# Patient Record
Sex: Male | Born: 1940 | Race: White | Hispanic: No | Smoking: Former smoker
Health system: Southern US, Community
[De-identification: ages and names within clinical notes are randomized; demographics above are authoritative.]

## PROBLEM LIST (undated history)

## (undated) DIAGNOSIS — M1711 Unilateral primary osteoarthritis, right knee: Secondary | ICD-10-CM

## (undated) DIAGNOSIS — K5792 Diverticulitis of intestine, part unspecified, without perforation or abscess without bleeding: Secondary | ICD-10-CM

## (undated) DIAGNOSIS — I2510 Atherosclerotic heart disease of native coronary artery without angina pectoris: Secondary | ICD-10-CM

## (undated) DIAGNOSIS — I10 Essential (primary) hypertension: Secondary | ICD-10-CM

## (undated) DIAGNOSIS — G4730 Sleep apnea, unspecified: Secondary | ICD-10-CM

## (undated) DIAGNOSIS — Z7189 Other specified counseling: Secondary | ICD-10-CM

## (undated) DIAGNOSIS — N200 Calculus of kidney: Secondary | ICD-10-CM

## (undated) DIAGNOSIS — E7800 Pure hypercholesterolemia, unspecified: Secondary | ICD-10-CM

## (undated) DIAGNOSIS — M545 Low back pain, unspecified: Secondary | ICD-10-CM

## (undated) DIAGNOSIS — C4490 Unspecified malignant neoplasm of skin, unspecified: Secondary | ICD-10-CM

## (undated) DIAGNOSIS — I219 Acute myocardial infarction, unspecified: Secondary | ICD-10-CM

## (undated) DIAGNOSIS — F32A Depression, unspecified: Secondary | ICD-10-CM

## (undated) DIAGNOSIS — C8590 Non-Hodgkin lymphoma, unspecified, unspecified site: Secondary | ICD-10-CM

## (undated) DIAGNOSIS — F419 Anxiety disorder, unspecified: Secondary | ICD-10-CM

## (undated) DIAGNOSIS — G8929 Other chronic pain: Secondary | ICD-10-CM

## (undated) DIAGNOSIS — F329 Major depressive disorder, single episode, unspecified: Secondary | ICD-10-CM

## (undated) DIAGNOSIS — C8308 Small cell B-cell lymphoma, lymph nodes of multiple sites: Secondary | ICD-10-CM

## (undated) DIAGNOSIS — I209 Angina pectoris, unspecified: Secondary | ICD-10-CM

## (undated) HISTORY — PX: TOTAL KNEE ARTHROPLASTY: SHX125

## (undated) HISTORY — PX: SKIN CANCER EXCISION: SHX779

## (undated) HISTORY — PX: KNEE ARTHROSCOPY: SHX127

## (undated) HISTORY — PX: CARDIAC CATHETERIZATION: SHX172

## (undated) HISTORY — PX: LITHOTRIPSY: SUR834

## (undated) HISTORY — PX: JOINT REPLACEMENT: SHX530

---
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
## 1998-04-27 ENCOUNTER — Ambulatory Visit (HOSPITAL_COMMUNITY): Admission: RE | Admit: 1998-04-27 | Discharge: 1998-04-27 | Payer: Self-pay | Admitting: Otolaryngology

## 1999-01-09 DIAGNOSIS — I219 Acute myocardial infarction, unspecified: Secondary | ICD-10-CM

## 1999-01-09 HISTORY — PX: CORONARY ARTERY BYPASS GRAFT: SHX141

## 1999-01-09 HISTORY — DX: Acute myocardial infarction, unspecified: I21.9

## 2003-12-13 ENCOUNTER — Ambulatory Visit: Payer: Self-pay | Admitting: Internal Medicine

## 2003-12-16 ENCOUNTER — Ambulatory Visit: Payer: Self-pay | Admitting: Internal Medicine

## 2003-12-20 ENCOUNTER — Ambulatory Visit: Payer: Self-pay | Admitting: Internal Medicine

## 2003-12-23 ENCOUNTER — Ambulatory Visit: Payer: Self-pay | Admitting: Internal Medicine

## 2003-12-27 ENCOUNTER — Ambulatory Visit: Payer: Self-pay | Admitting: Internal Medicine

## 2003-12-31 ENCOUNTER — Ambulatory Visit: Payer: Self-pay | Admitting: Internal Medicine

## 2004-01-31 ENCOUNTER — Ambulatory Visit: Payer: Self-pay | Admitting: Internal Medicine

## 2004-02-21 ENCOUNTER — Ambulatory Visit: Payer: Self-pay | Admitting: Internal Medicine

## 2004-11-13 ENCOUNTER — Ambulatory Visit: Payer: Self-pay | Admitting: Internal Medicine

## 2004-11-27 ENCOUNTER — Ambulatory Visit: Payer: Self-pay | Admitting: Internal Medicine

## 2005-02-05 ENCOUNTER — Ambulatory Visit: Payer: Self-pay | Admitting: Internal Medicine

## 2005-02-12 ENCOUNTER — Ambulatory Visit: Payer: Self-pay | Admitting: Internal Medicine

## 2006-04-08 ENCOUNTER — Ambulatory Visit: Payer: Self-pay | Admitting: Internal Medicine

## 2006-04-08 LAB — CONVERTED CEMR LAB
ALT: 32 units/L (ref 0–40)
AST: 26 units/L (ref 0–37)
Alkaline Phosphatase: 62 units/L (ref 39–117)
Bilirubin, Direct: 0.1 mg/dL (ref 0.0–0.3)
CO2: 30 meq/L (ref 19–32)
Chloride: 106 meq/L (ref 96–112)
Cholesterol: 134 mg/dL (ref 0–200)
Eosinophils Absolute: 0.1 10*3/uL (ref 0.0–0.6)
Eosinophils Relative: 1.8 % (ref 0.0–5.0)
GFR calc non Af Amer: 80 mL/min
Glucose, Bld: 96 mg/dL (ref 70–99)
Monocytes Absolute: 0.9 10*3/uL — ABNORMAL HIGH (ref 0.2–0.7)
Monocytes Relative: 10.7 % (ref 3.0–11.0)
Neutro Abs: 3.7 10*3/uL (ref 1.4–7.7)
Neutrophils Relative %: 45.2 % (ref 43.0–77.0)
RDW: 12.5 % (ref 11.5–14.6)
Sodium: 139 meq/L (ref 135–145)
TSH: 2.26 microintl units/mL (ref 0.35–5.50)
Total Bilirubin: 0.9 mg/dL (ref 0.3–1.2)
Total Protein: 6.3 g/dL (ref 6.0–8.3)
VLDL: 23 mg/dL (ref 0–40)
WBC: 8.1 10*3/uL (ref 4.5–10.5)

## 2006-04-15 ENCOUNTER — Ambulatory Visit: Payer: Self-pay | Admitting: Internal Medicine

## 2006-07-29 ENCOUNTER — Encounter: Payer: Self-pay | Admitting: Internal Medicine

## 2007-01-27 ENCOUNTER — Ambulatory Visit: Payer: Self-pay | Admitting: Internal Medicine

## 2007-02-20 ENCOUNTER — Ambulatory Visit: Payer: Self-pay | Admitting: Internal Medicine

## 2007-02-20 ENCOUNTER — Encounter: Payer: Self-pay | Admitting: Internal Medicine

## 2007-02-21 ENCOUNTER — Telehealth: Payer: Self-pay | Admitting: Internal Medicine

## 2007-03-05 ENCOUNTER — Encounter: Payer: Self-pay | Admitting: Internal Medicine

## 2009-05-06 ENCOUNTER — Encounter: Admission: RE | Admit: 2009-05-06 | Discharge: 2009-05-06 | Payer: Self-pay | Admitting: Family Medicine

## 2009-06-08 HISTORY — PX: TOTAL SHOULDER REPLACEMENT: SUR1217

## 2009-06-21 ENCOUNTER — Inpatient Hospital Stay (HOSPITAL_COMMUNITY): Admission: RE | Admit: 2009-06-21 | Discharge: 2009-06-22 | Payer: Self-pay | Admitting: Orthopedic Surgery

## 2009-08-08 ENCOUNTER — Encounter: Admission: RE | Admit: 2009-08-08 | Discharge: 2009-11-06 | Payer: Self-pay | Admitting: Orthopedic Surgery

## 2010-03-02 ENCOUNTER — Ambulatory Visit: Payer: Medicare Other | Attending: Orthopedic Surgery | Admitting: Rehabilitation

## 2010-03-02 DIAGNOSIS — M25619 Stiffness of unspecified shoulder, not elsewhere classified: Secondary | ICD-10-CM | POA: Insufficient documentation

## 2010-03-02 DIAGNOSIS — IMO0001 Reserved for inherently not codable concepts without codable children: Secondary | ICD-10-CM | POA: Insufficient documentation

## 2010-03-02 DIAGNOSIS — M25519 Pain in unspecified shoulder: Secondary | ICD-10-CM | POA: Insufficient documentation

## 2010-03-09 ENCOUNTER — Ambulatory Visit: Payer: Medicare Other | Attending: Orthopedic Surgery | Admitting: Physical Therapy

## 2010-03-09 DIAGNOSIS — M25519 Pain in unspecified shoulder: Secondary | ICD-10-CM | POA: Insufficient documentation

## 2010-03-09 DIAGNOSIS — IMO0001 Reserved for inherently not codable concepts without codable children: Secondary | ICD-10-CM | POA: Insufficient documentation

## 2010-03-09 DIAGNOSIS — M25619 Stiffness of unspecified shoulder, not elsewhere classified: Secondary | ICD-10-CM | POA: Insufficient documentation

## 2010-03-16 ENCOUNTER — Ambulatory Visit: Payer: Medicare Other | Admitting: Physical Therapy

## 2010-03-23 ENCOUNTER — Ambulatory Visit: Payer: Medicare Other | Admitting: Physical Therapy

## 2010-03-26 LAB — CBC
Platelets: 111 10*3/uL — ABNORMAL LOW (ref 150–400)
RDW: 13.4 % (ref 11.5–15.5)
WBC: 12.3 10*3/uL — ABNORMAL HIGH (ref 4.0–10.5)

## 2010-03-26 LAB — BASIC METABOLIC PANEL
CO2: 22 mEq/L (ref 19–32)
GFR calc Af Amer: 33 mL/min — ABNORMAL LOW (ref >60)
Glucose, Bld: 108 mg/dL — ABNORMAL HIGH (ref 70–99)

## 2010-03-27 LAB — PROTIME-INR: INR: 1.04 (ref 0.00–1.49)

## 2010-03-27 LAB — DIFFERENTIAL
Basophils Absolute: 0 10*3/uL (ref 0.0–0.1)
Eosinophils Absolute: 0.1 10*3/uL (ref 0.0–0.7)
Lymphocytes Relative: 53 % — ABNORMAL HIGH (ref 12–46)
Lymphs Abs: 5.4 10*3/uL — ABNORMAL HIGH (ref 0.7–4.0)
Monocytes Relative: 8 % (ref 3–12)
Neutrophils Relative %: 38 % — ABNORMAL LOW (ref 43–77)

## 2010-03-27 LAB — COMPREHENSIVE METABOLIC PANEL
ALT: 26 U/L (ref 0–53)
AST: 28 U/L (ref 0–37)
Chloride: 109 mEq/L (ref 96–112)
Sodium: 139 mEq/L (ref 135–145)
Total Bilirubin: 1 mg/dL (ref 0.3–1.2)

## 2010-03-27 LAB — CBC
Hemoglobin: 18.4 g/dL — ABNORMAL HIGH (ref 13.0–17.0)
Hemoglobin: 18.8 g/dL — ABNORMAL HIGH (ref 13.0–17.0)
MCHC: 34.2 g/dL (ref 30.0–36.0)
MCV: 94.2 fL (ref 78.0–100.0)
Platelets: 135 10*3/uL — ABNORMAL LOW (ref 150–400)
RBC: 5.72 MIL/uL (ref 4.22–5.81)
RDW: 13.4 % (ref 11.5–15.5)
RDW: 13.6 % (ref 11.5–15.5)
WBC: 12.6 10*3/uL — ABNORMAL HIGH (ref 4.0–10.5)

## 2010-03-27 LAB — POCT I-STAT 4, (NA,K, GLUC, HGB,HCT)
HCT: 47 % (ref 39.0–52.0)
Hemoglobin: 16 g/dL (ref 13.0–17.0)
Sodium: 136 mEq/L (ref 135–145)

## 2010-03-27 LAB — BASIC METABOLIC PANEL
BUN: 14 mg/dL (ref 6–23)
Calcium: 9.3 mg/dL (ref 8.4–10.5)
Chloride: 105 mEq/L (ref 96–112)
GFR calc non Af Amer: >60 mL/min (ref >60)
Glucose, Bld: 88 mg/dL (ref 70–99)
Sodium: 133 mEq/L — ABNORMAL LOW (ref 135–145)

## 2010-06-25 IMAGING — CR DG SHOULDER 1V*L*
2 series · 2 of 2 positions shown · non-contrast
Comparison: 05/06/2009 CT

CLINICAL DATA: Postoperative exam after left shoulder arthroplasty

PORTABLE LEFT SHOULDER - 2+ VIEW

[view not recorded (1 of 2)]
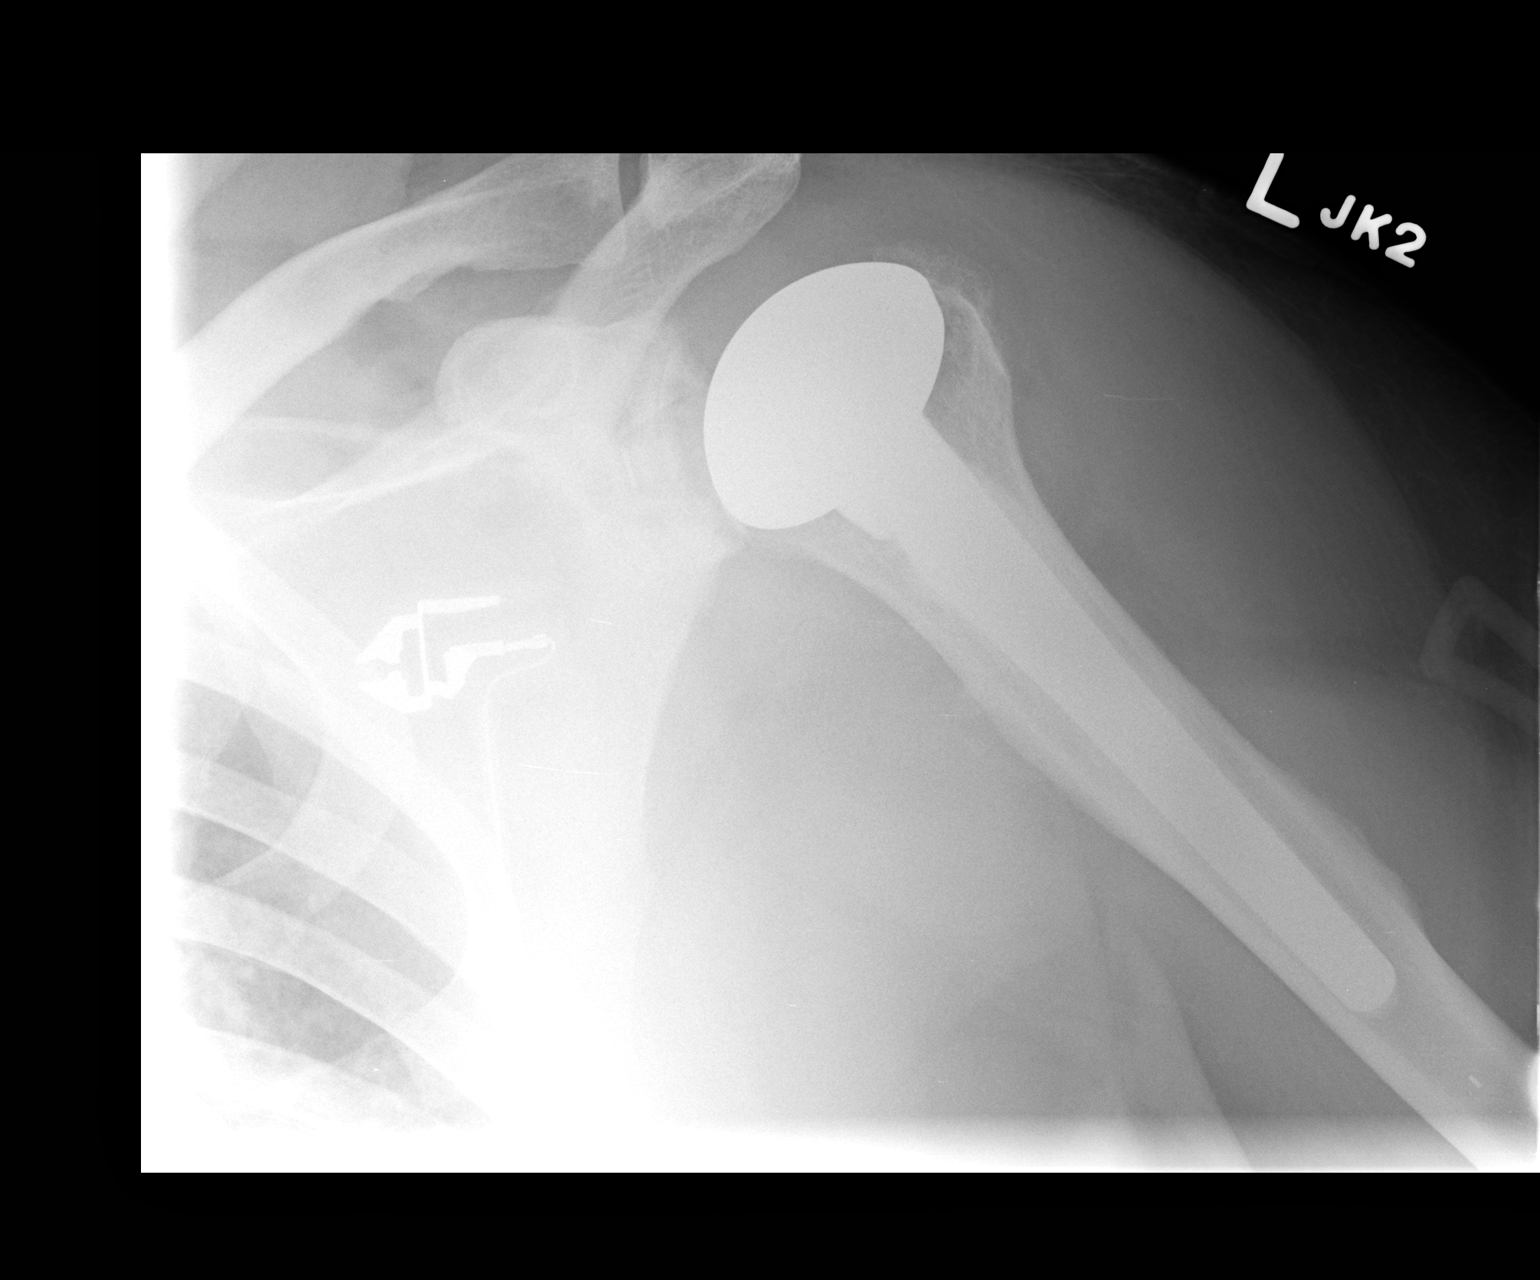

[view not recorded (2 of 2)]
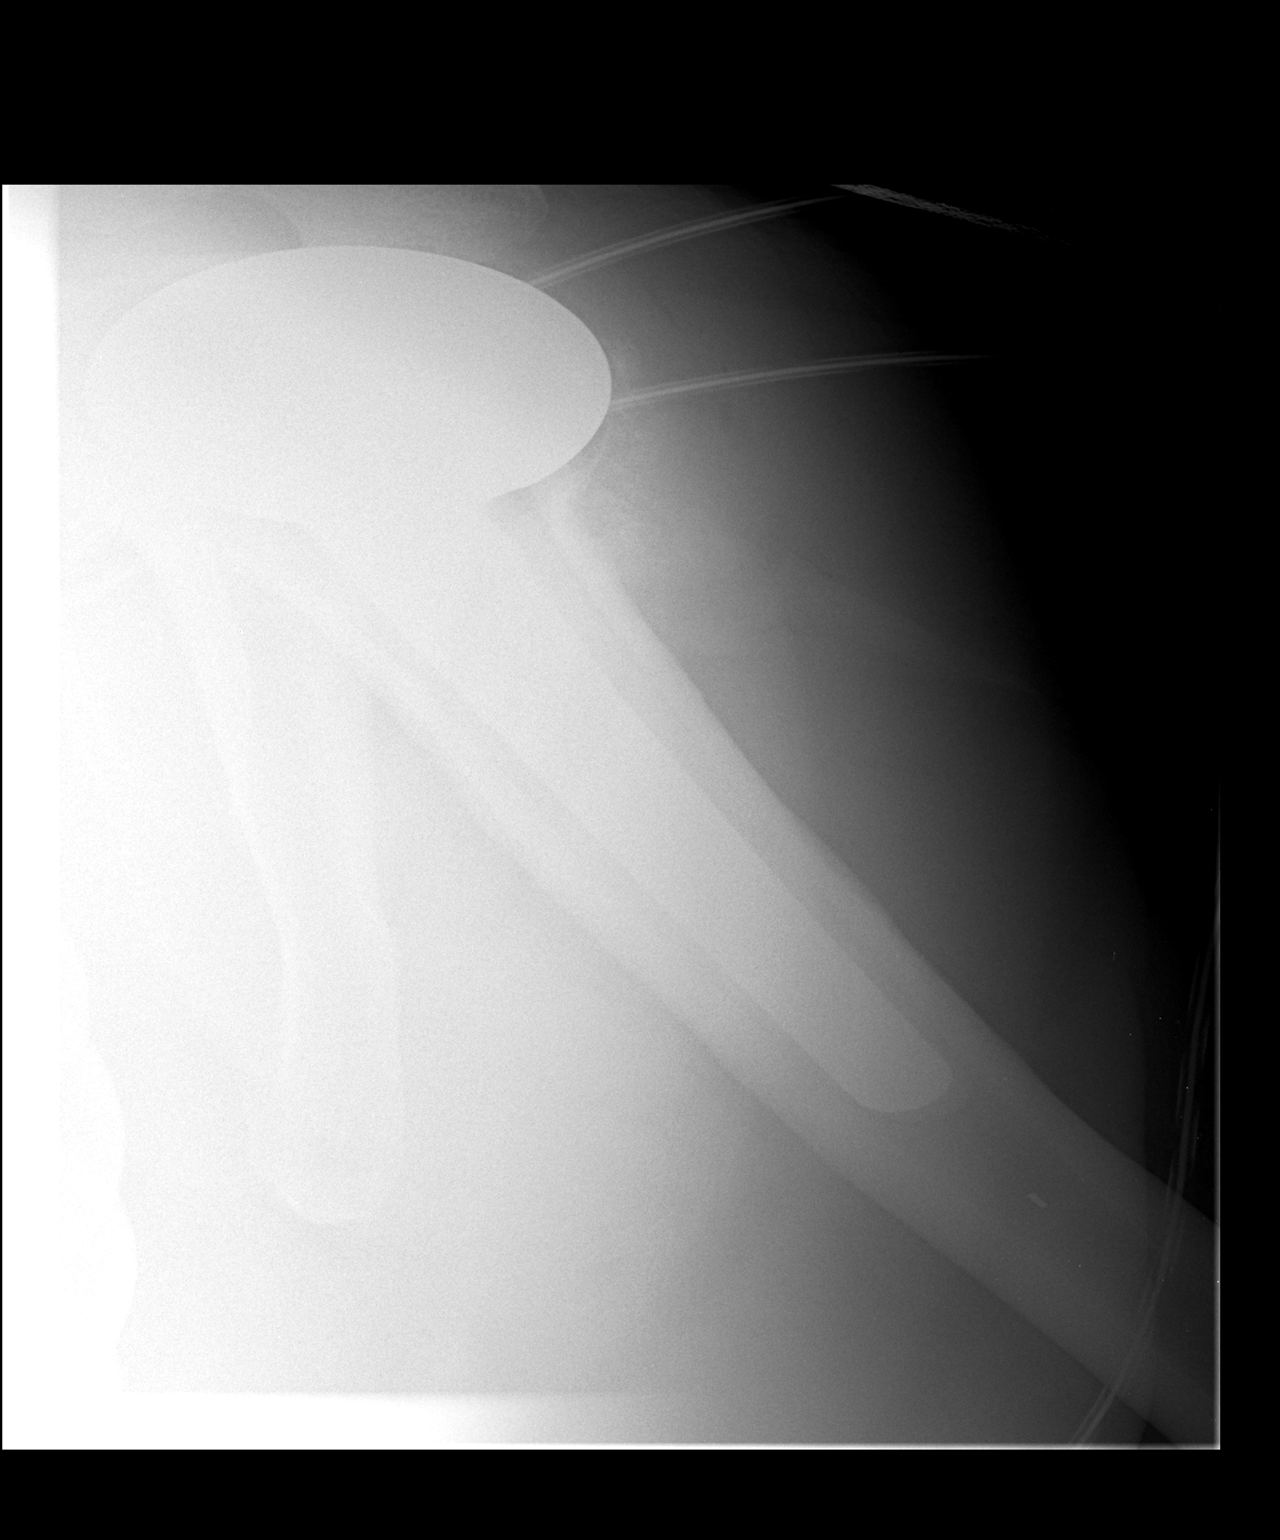

[2 of 2 positions shown; findings below may reference images not displayed]

FINDINGS: Allowing for technique, expected postoperative appearance
of the humeral component of left shoulder arthroplasty.  No
fracture line identified.  AC joint degenerative changes noted.
IMPRESSION: Expected postoperative appearance after left shoulder arthroplasty.

## 2011-06-22 ENCOUNTER — Telehealth: Payer: Self-pay | Admitting: Hematology & Oncology

## 2011-06-22 ENCOUNTER — Telehealth: Payer: Self-pay | Admitting: *Deleted

## 2011-06-22 NOTE — Telephone Encounter (Signed)
Received fax from [facility] records were from 2010. I talked with them and she said they would fax recent records. I have not received them yet.I do not know why patient is being referred to [place].

## 2011-06-22 NOTE — Telephone Encounter (Signed)
Patient lives in [place], prefers the [facility] satellite office for new patient consultation. Faxed to [facility], informed patient he would be calling him to arrange appt with Dr Myna Hidalgo.

## 2011-07-02 ENCOUNTER — Telehealth: Payer: Self-pay | Admitting: Hematology & Oncology

## 2011-07-02 NOTE — Telephone Encounter (Signed)
Patient called left message wanting to schedule appointment. I called left patient to message to call for appointment

## 2011-07-03 ENCOUNTER — Telehealth: Payer: Self-pay | Admitting: Hematology & Oncology

## 2011-07-03 NOTE — Telephone Encounter (Signed)
Pt aware of 07-11-11 appointment

## 2011-07-11 ENCOUNTER — Other Ambulatory Visit (HOSPITAL_BASED_OUTPATIENT_CLINIC_OR_DEPARTMENT_OTHER): Payer: Medicare Other | Admitting: Lab

## 2011-07-11 ENCOUNTER — Ambulatory Visit: Payer: Medicare Other

## 2011-07-11 ENCOUNTER — Ambulatory Visit (HOSPITAL_BASED_OUTPATIENT_CLINIC_OR_DEPARTMENT_OTHER): Payer: Medicare Other | Admitting: Hematology & Oncology

## 2011-07-11 ENCOUNTER — Other Ambulatory Visit (HOSPITAL_COMMUNITY)
Admission: RE | Admit: 2011-07-11 | Discharge: 2011-07-11 | Disposition: A | Discharge status: Home / Self Care | Payer: Medicare Other | Source: Ambulatory Visit | Attending: Hematology & Oncology | Admitting: Hematology & Oncology

## 2011-07-11 VITALS — BP 110/68 | HR 53 | Temp 97.5°F | Ht 73.0 in | Wt 309.0 lb

## 2011-07-11 DIAGNOSIS — D47Z9 Other specified neoplasms of uncertain behavior of lymphoid, hematopoietic and related tissue: Secondary | ICD-10-CM

## 2011-07-11 DIAGNOSIS — C96A Histiocytic sarcoma: Secondary | ICD-10-CM | POA: Insufficient documentation

## 2011-07-11 DIAGNOSIS — C8290 Follicular lymphoma, unspecified, unspecified site: Secondary | ICD-10-CM

## 2011-07-11 DIAGNOSIS — C8589 Other specified types of non-Hodgkin lymphoma, extranodal and solid organ sites: Secondary | ICD-10-CM | POA: Insufficient documentation

## 2011-07-11 LAB — CBC WITH DIFFERENTIAL (CANCER CENTER ONLY)
BASO#: 0 10*3/uL (ref 0.0–0.2)
BASO%: 0.1 % (ref 0.0–2.0)
EOS%: 1.3 % (ref 0.0–7.0)
Eosinophils Absolute: 0.1 10*3/uL (ref 0.0–0.5)
LYMPH%: 58.2 % — ABNORMAL HIGH (ref 14.0–48.0)
MCHC: 34.8 g/dL (ref 32.0–35.9)
MCV: 95 fL (ref 82–98)
MONO#: 0.9 10*3/uL (ref 0.1–0.9)
MONO%: 9.5 % (ref 0.0–13.0)
WBC: 9.3 10*3/uL (ref 4.0–10.0)

## 2011-07-11 NOTE — Progress Notes (Signed)
This office note has been dictated.

## 2011-07-13 ENCOUNTER — Telehealth: Payer: Self-pay | Admitting: Hematology & Oncology

## 2011-07-13 NOTE — Telephone Encounter (Signed)
Mailed October schedule 

## 2011-07-16 LAB — COMPREHENSIVE METABOLIC PANEL
ALT: 38 U/L (ref 0–53)
AST: 24 U/L (ref 0–37)
Alkaline Phosphatase: 59 U/L (ref 39–117)
CO2: 25 mEq/L (ref 19–32)
Calcium: 9.3 mg/dL (ref 8.4–10.5)
Total Bilirubin: 0.4 mg/dL (ref 0.3–1.2)
Total Protein: 6 g/dL (ref 6.0–8.3)

## 2011-07-16 LAB — RETICULOCYTES (CHCC)
RBC.: 4.96 MIL/uL (ref 4.22–5.81)
Retic Ct Pct: 0.8 % (ref 0.4–2.3)

## 2011-07-16 LAB — IGG, IGA, IGM
IgG (Immunoglobin G), Serum: 668 mg/dL (ref 650–1600)
IgM, Serum: 39 mg/dL — ABNORMAL LOW (ref 41–251)

## 2011-07-16 LAB — KAPPA/LAMBDA LIGHT CHAINS
Kappa free light chain: 1.28 mg/dL (ref 0.33–1.94)
Kappa:Lambda Ratio: 0.81 (ref 0.26–1.65)

## 2011-07-16 LAB — PROTEIN ELECTROPHORESIS, SERUM, WITH REFLEX
Beta 2: 5.3 % (ref 3.2–6.5)
Beta Globulin: 5.8 % (ref 4.7–7.2)
Total Protein, Serum Electrophoresis: 6 g/dL (ref 6.0–8.3)

## 2011-07-16 LAB — LACTATE DEHYDROGENASE: LDH: 140 U/L (ref 94–250)

## 2011-07-16 NOTE — Progress Notes (Signed)
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  HISTORY OF PRESENT ILLNESS:  Mr. Mcintyre is a really nice 71 year old white gentleman.  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  He and his family have been down here for about 15 years.  He had been seen by Dr. Foy Guadalajara over in [facility].  Going back 3 years, Dr. Ottis Stain, I think, noted some abnormalities with his blood work. Dr. Ottis Stain, being as thorough as he is, did a very extensive evaluation. He was concerned about a lymphoproliferative process.  He also noted that Mr. Pressly had erythrocytosis.  He thought the erythrocytosis may have been from the possibility of sleep apnea.  Dr. Ottis Stain ordered flow cytometry on his peripheral blood.  The flow cytometry done back in June 2010 showed a monoclonal B-cell population with a CD-5 expression.  It was felt that there was the possibility of mantle cell lymphoma.  A FISH analysis on peripheral blood was negative for T (11:14) translocation.  A bone marrow biopsy was done back in November 2010.  The bone marrow biopsy did show a monoclonal population of B cells.  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  Dr. Ottis Stain, I think, has done scans and everything.  The scans have never shown any lymphadenopathy.  There has been no splenomegaly.  Everything otherwise has been unremarkable.  Mr. Roarty apparently had a "falling out" with Dr. Ottis Stain.  As such, Mr. Granzow was referred to [facility] for an evaluation of this hematologic issue.  He feels good.  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  He does see Dr. Pete Glatter of Urology.  He is on transdermal testosterone gel.  Mr. Misko has not lost weight.  He has had no swollen lymph glands.  He has had no early satiety.  He has had no change in bowel or bladder habits.  He has had no  rashes.  Again, he has had multiple, multiple studies done with respect to his peripheral blood and bone marrow.  Of note, Mr. Wallen also has had a JAK2 assay because of his erythrocytosis.  The JAK2 assay was negative.  He says he just had a recent CT scan because of kidney stones.  He was told that there was nothing with the CT scan that showed any unusual findings.  PAST MEDICAL HISTORY:  Remarkable for: 1. Coronary artery disease, status post CABG x3 in 2002. 2. Left knee repair 2008. 3. Hyperlipidemia. 4. BPH. 5. Hypogonadism.  ALLERGIES:  None.  MEDICATIONS:  Vitamin C 1000 mg p.o. daily, aspirin 81 mg p.o. daily, vitamin D3 5000 units daily, Welchol 20 mg p.o. daily, Diovan 160 mg p.o. daily, Lopressor 25 mg p.o. b.i.d., Testim gel daily.  SOCIAL HISTORY:  Remarkable for past tobacco use.  He stopped smoking, I think, in the 2002 when he had his heart attack.  He probably had about a 40-pack-year history of tobacco use.  He has rare alcohol use.  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  There are no obvious occupational exposures.  FAMILY HISTORY:  Negative for any type of hematologic disorder.  There is a history of diabetes, coronary artery disease.  REVIEW OF SYSTEMS:  As stated in history of present illness.  No additional findings noted on a 12-system review. Autism.  PHYSICAL EXAMINATION:  General:  This is a well-developed, well- nourished white gentleman in no obvious distress.  Vital signs: Temperature of 97.5, pulse 53, respiratory rate 26, blood pressure 110/68, weight is 309 pounds.  Head and neck:  Normocephalic, atraumatic skull.  There is no scleral icterus.  Pupils react appropriately.  I see no intraoral lesions.  He has no palpable cervical or supraclavicular lymph nodes.  Thyroid is nontender.  Lungs:  Clear bilaterally. Cardiac:  Regular rate and rhythm with a normal S1, S2.  There are no murmurs, rubs or bruits.  Abdomen:  Soft with  good bowel sounds.  He is somewhat obese.  He has no fluid wave.  There is no abdominal mass. There is no palpable hepatosplenomegaly.  Back:  Shows no tenderness over the spine, ribs, or hips.  Extremities:  Show no clubbing, cyanosis or edema.  He has good range motion of his joints.  Neurological:  Shows no focal neurological deficits.  LABORATORY STUDIES:  White cell count 9.3, hemoglobin 16.4, hematocrit 47.1, platelet count 111.  MCV is 95.  White cell differential shows 31 segs, 58 lymphocytes, 10 monos.  Peripheral smear shows a normochromic, normocytic population of red blood cells.  There are no nucleated red blood cells.  There are no teardrop cells.  I see no schistocytes.  There are no target cells. There are no spherocytes.  White cells do show an increase in lymphocytes.  He does have some atypical lymphocytes.  He has a few large lymphocytes.  The majority of the lymphocytes are small mature- appearing lymphocytes.  I do not see any __________ lymphocytes.  There are no blasts.  Platelets are decreased in number.  He has well- granulated platelets.  IMPRESSION:  Mr. 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  He is still asymptomatic.  He has been dealing with this now for 3 years.  Dr. Ottis Stain did do a very thorough workup already.  As such, I do not see that we are going to have to repeat any kind of invasive tests.  I do not see that we are going to have to repeat molecular tests as these were all done.  The tests previously did seem to be fairly definitive for a low-grade type of a lymphoproliferative process, whether it be chronic lymphocytic leukemia or marginal-cell lymphoma.  I am totally willing to follow this conservatively.  I think that the key for Mr. Roark will be his platelet count.  I think if his platelet count drops, then this would be an indicator for more aggressive disease.  It could be an indicator for  further marrow infiltration or possibly idiopathic thrombocytopenic purpura associated with lymphoma.  I spent a good hour and a half with Mr. Whitford and his wife.  They are both very, very nice.  I explained to them the reasons that we do invasive studies in patients who have of blood problems.  I tried to reassure him that he did not have "cancer."  I do not see that any scans need to be done on him right now. I want to see Mr. Schmelzle back in about 3 months.  I think this will be a reasonable timeframe for followup.  When we see him back, we will just get a CBC on him.  I did send off immunoglobulin studies.  I do want to see if he does have a monoclonal protein which could be suggestive of a low-grade lymphoma.  I do not see a  need for scanning.    ______________________________ Josph Macho, M.D. PRE/MEDQ  D:  07/11/2011  T:  07/11/2011  Job:  [id number]

## 2011-07-18 ENCOUNTER — Telehealth: Payer: Self-pay | Admitting: Oncology

## 2011-07-18 NOTE — Telephone Encounter (Addendum)
Message copied by Lacie Draft on Wed Jul 18, 2011  4:33 PM ------      Message from: Arlan Organ R      Created: Wed Jul 18, 2011  7:43 AM       Call and tell him that his lab work looks very good. I do not see any problems for the near future. Cindee Lame 4:35 PM 07/18/2011 Left message for patient regarding lab work. Teola Bradley, Kortnie Stovall [employer]

## 2011-07-19 LAB — FLOW CYTOMETRY - CHCC SATELLITE

## 2011-07-20 ENCOUNTER — Other Ambulatory Visit: Payer: Self-pay

## 2011-07-20 ENCOUNTER — Other Ambulatory Visit: Payer: Self-pay | Admitting: Hematology & Oncology

## 2011-10-10 ENCOUNTER — Other Ambulatory Visit (HOSPITAL_BASED_OUTPATIENT_CLINIC_OR_DEPARTMENT_OTHER): Payer: Medicare Other | Admitting: Lab

## 2011-10-10 ENCOUNTER — Ambulatory Visit (HOSPITAL_BASED_OUTPATIENT_CLINIC_OR_DEPARTMENT_OTHER): Payer: Medicare Other | Admitting: Hematology & Oncology

## 2011-10-10 VITALS — BP 126/60 | HR 60 | Temp 98.1°F | Resp 22 | Ht 73.0 in | Wt 310.0 lb

## 2011-10-10 DIAGNOSIS — D47Z9 Other specified neoplasms of uncertain behavior of lymphoid, hematopoietic and related tissue: Secondary | ICD-10-CM

## 2011-10-10 DIAGNOSIS — C8290 Follicular lymphoma, unspecified, unspecified site: Secondary | ICD-10-CM

## 2011-10-10 DIAGNOSIS — C8590 Non-Hodgkin lymphoma, unspecified, unspecified site: Secondary | ICD-10-CM

## 2011-10-10 LAB — CBC WITH DIFFERENTIAL (CANCER CENTER ONLY)
BASO#: 0 10*3/uL (ref 0.0–0.2)
EOS%: 1.8 % (ref 0.0–7.0)
MCH: 32.6 pg (ref 28.0–33.4)
MCHC: 34.9 g/dL (ref 32.0–35.9)
MONO%: 6.9 % (ref 0.0–13.0)
NEUT#: 3.6 10*3/uL (ref 1.5–6.5)
Platelets: 126 10*3/uL — ABNORMAL LOW (ref 145–400)

## 2011-10-10 LAB — CHCC SATELLITE - SMEAR

## 2011-10-10 NOTE — Progress Notes (Signed)
This office note has been dictated.

## 2011-10-12 NOTE — Progress Notes (Signed)
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  CURRENT THERAPY:  Observation.  INTERIM HISTORY:  David Hubbard comes in for followup.  We saw him initially back in early July.  Since then, he has been doing okay.  He does have an underlying lymphoproliferative disorder.  We did go ahead and do a flow cytometry test on his peripheral blood.  The flow cytometry did show a monoclonal population of cells.  There is co-expression of CD5 and CD20.  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  He is totally asymptomatic with this.  We did do monoclonal studies.  He does not have a monoclonal spike.  His light chains and heavy chains are all within normal range.  Electrolytes were all within normal limits.  Again, he has had a good summer.  He stopped taking Testim.  This was thought to maybe cause some erythrocytosis.  He has had no fevers or sweats.  He has had no nausea and vomiting. He has had no palpable lymph glands.  He has had no abdominal pain.  There is no change in bowel or bladder habits.  PHYSICAL EXAMINATION:  This is a well-developed, well-nourished white gentleman in no obvious distress.  Vital signs:  Temperature of 98.1, pulse 60, respiratory rate 20, blood pressure 126/60.  Weight is 210 pounds.  Head and neck:  Normocephalic, atraumatic skull.  There are no ocular or oral lesions.  There are no palpable cervical or supraclavicular lymph nodes.  Lungs:  Clear bilaterally.  Cardiac: Regular rate and rhythm with a normal S1 and S2.  There are no murmurs, rubs or bruits.  Abdomen:  Soft with good bowel sounds.  There is no palpable abdominal mass.  There is no fluid wave.  There is no palpable hepatosplenomegaly. Back:  No tenderness of the spine, ribs, or hips. Axillary:  Exam shows no bilateral axillary adenopathy.  Extremities: No  clubbing, no clubbing, cyanosis or edema.  Skin:  No rashes, ecchymosis or petechia.  LABORATORY STUDIES:  White cell count of 4.6, hemoglobin 15.3, hematocrit 43.9, platelet count 126.  White cell differential shows 30 segs, 62 lymphocytes.  Peripheral smear does show some smudge cells.  He does have mature appearing lymphocytes.  I see no immature myeloid cells.  There are no hypersegmented polys.  There are no nucleated red cells.  He has normal platelets.  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  He is totally asymptomatic.  At this point in time, we can follow him along supportively.  I do not see a need for any invasive studies.  I do not see need for any scans on him.  We will plan to get him back after the holidays now.  I think this would be a very reasonable.  David Hubbard is one who does not like to go to doctors unless he really needs to. We will honor that and only make his appointments really when necessary.    ______________________________ Josph Macho, M.D. PRE/MEDQ  D:  10/10/2011  T:  10/11/2011  Job:  [id number]

## 2012-02-06 ENCOUNTER — Encounter: Payer: Self-pay | Admitting: Internal Medicine

## 2012-02-13 ENCOUNTER — Ambulatory Visit (HOSPITAL_BASED_OUTPATIENT_CLINIC_OR_DEPARTMENT_OTHER): Payer: Medicare Other | Admitting: Hematology & Oncology

## 2012-02-13 ENCOUNTER — Other Ambulatory Visit (HOSPITAL_BASED_OUTPATIENT_CLINIC_OR_DEPARTMENT_OTHER): Payer: Medicare Other | Admitting: Lab

## 2012-02-13 VITALS — BP 102/56 | HR 58 | Temp 97.8°F | Resp 18 | Ht 73.0 in | Wt 312.0 lb

## 2012-02-13 DIAGNOSIS — D696 Thrombocytopenia, unspecified: Secondary | ICD-10-CM

## 2012-02-13 DIAGNOSIS — C8590 Non-Hodgkin lymphoma, unspecified, unspecified site: Secondary | ICD-10-CM

## 2012-02-13 LAB — CBC WITH DIFFERENTIAL (CANCER CENTER ONLY)
EOS%: 2.1 % (ref 0.0–7.0)
Eosinophils Absolute: 0.2 10*3/uL (ref 0.0–0.5)
LYMPH%: 61.6 % — ABNORMAL HIGH (ref 14.0–48.0)
MCH: 32 pg (ref 28.0–33.4)
MCHC: 34.8 g/dL (ref 32.0–35.9)
MCV: 92 fL (ref 82–98)
MONO%: 7.9 % (ref 0.0–13.0)
Platelets: 115 10*3/uL — ABNORMAL LOW (ref 145–400)
RBC: 5.09 10*6/uL (ref 4.20–5.70)
RDW: 12.9 % (ref 11.1–15.7)

## 2012-02-13 LAB — CHCC SATELLITE - SMEAR

## 2012-02-13 NOTE — Progress Notes (Signed)
This office note has been dictated.

## 2012-02-14 NOTE — Progress Notes (Signed)
CC:   [name], MD Eulas Post, MD [name], [place], [place] [phone]  DIAGNOSIS:  Low-grade non-Hodgkin lymphoma-asymptomatic.  CURRENT THERAPY:  Observation.  INTERIM HISTORY:  Mr. Harton comes in for followup.  We last saw him back in October.  Since then, he has been doing well.  Unfortunately, his problem now is a left kidney stone.  This is symptomatic for him.  He sees Dr. Pete Glatter of Urology.  He says this is a uric acid stone and he is on medicine to try to help dissolve it.  He also is having problems with his right knee.  He is not sure when he is going to get surgery for this.  Otherwise, he has had no issues.  There has been no fever.  He has had no bony pain outside of his right knee.  He has had no change in bowel or bladder habits.  There has been some hematuria from the kidney stone.  He has had no cough.  There have been no swollen lymph glands.  He has had no weight loss or weight gain.  PHYSICAL EXAMINATION:  General:  This is a well-developed, well- nourished white gentleman in no obvious distress.  Vital signs: Temperature of 97.8, pulse 58, respiratory rate 18, blood pressure 102/56.  Weight is 312 pounds.  Head and neck:  Normocephalic, atraumatic skull.  There are no ocular or oral lesions.  There are no palpable cervical or supraclavicular lymph nodes.  Lungs:  Clear bilaterally.  Cardiac:  Regular rate and rhythm with a normal S1 and S2. There are no murmurs, rubs, or bruits.  Abdomen:  Soft abdomen.  He is mildly obese.  He has no fluid wave.  There is no guarding or rebound tenderness.  There is no left flank tenderness.  There is no palpable hepatosplenomegaly.  Back:  No tenderness over the spine, ribs, or hips. Extremities:  No clubbing, cyanosis, or edema.  Neurologic:  No focal neurological deficits.  Skin:  Macular pustular lesion on the flexor aspect of his left forearm, this measured probably about 1 cm in diameter.  LABORATORY  STUDIES:  White cell count is 11.5, hemoglobin 16.3, hematocrit 46.9, platelet count 115.  White cell differential showed 28 segs, 62 lymphs.  IMPRESSION:  Mr. Boyett is a 72 year old gentleman with probable low-grade non-Hodgkin lymphoma.  Again, he is totally asymptomatic.  He has a monoclonal population of cells in his peripheral blood.  Again, we are following him supportively.  He does have some mild thrombocytopenia, but this I think will fluctuate.  I think we can probably get him back in 6 months now.  He certainly knows to come back sooner if he begins to have any problems.    ______________________________ Josph Macho, M.D. PRE/MEDQ  D:  02/13/2012  T:  02/14/2012  Job:  [id number]

## 2012-05-02 ENCOUNTER — Encounter (HOSPITAL_COMMUNITY): Payer: Self-pay | Admitting: Pharmacy Technician

## 2012-05-02 ENCOUNTER — Other Ambulatory Visit: Payer: Self-pay | Admitting: Orthopedic Surgery

## 2012-05-06 ENCOUNTER — Encounter (HOSPITAL_COMMUNITY)
Admission: RE | Admit: 2012-05-06 | Discharge: 2012-05-06 | Disposition: A | Discharge status: Home / Self Care | Payer: Medicare Other | Source: Ambulatory Visit | Attending: Orthopedic Surgery | Admitting: Orthopedic Surgery

## 2012-05-06 ENCOUNTER — Encounter (HOSPITAL_COMMUNITY): Payer: Self-pay

## 2012-05-06 DIAGNOSIS — I517 Cardiomegaly: Secondary | ICD-10-CM | POA: Insufficient documentation

## 2012-05-06 DIAGNOSIS — Z01818 Encounter for other preprocedural examination: Secondary | ICD-10-CM | POA: Insufficient documentation

## 2012-05-06 DIAGNOSIS — Z01812 Encounter for preprocedural laboratory examination: Secondary | ICD-10-CM | POA: Insufficient documentation

## 2012-05-06 DIAGNOSIS — I2510 Atherosclerotic heart disease of native coronary artery without angina pectoris: Secondary | ICD-10-CM | POA: Insufficient documentation

## 2012-05-06 DIAGNOSIS — R9431 Abnormal electrocardiogram [ECG] [EKG]: Secondary | ICD-10-CM | POA: Insufficient documentation

## 2012-05-06 DIAGNOSIS — Z01810 Encounter for preprocedural cardiovascular examination: Secondary | ICD-10-CM | POA: Insufficient documentation

## 2012-05-06 DIAGNOSIS — D696 Thrombocytopenia, unspecified: Secondary | ICD-10-CM | POA: Insufficient documentation

## 2012-05-06 DIAGNOSIS — I10 Essential (primary) hypertension: Secondary | ICD-10-CM | POA: Insufficient documentation

## 2012-05-06 DIAGNOSIS — I252 Old myocardial infarction: Secondary | ICD-10-CM | POA: Insufficient documentation

## 2012-05-06 HISTORY — DX: Non-Hodgkin lymphoma, unspecified, unspecified site: C85.90

## 2012-05-06 HISTORY — DX: Acute myocardial infarction, unspecified: I21.9

## 2012-05-06 HISTORY — DX: Sleep apnea, unspecified: G47.30

## 2012-05-06 HISTORY — DX: Atherosclerotic heart disease of native coronary artery without angina pectoris: I25.10

## 2012-05-06 HISTORY — DX: Unspecified malignant neoplasm of skin, unspecified: C44.90

## 2012-05-06 HISTORY — DX: Essential (primary) hypertension: I10

## 2012-05-06 LAB — ABO/RH: ABO/RH(D): O POS

## 2012-05-06 LAB — CBC
MCH: 32.3 pg (ref 26.0–34.0)
MCHC: 35.7 g/dL (ref 30.0–36.0)
MCV: 90.5 fL (ref 78.0–100.0)
Platelets: 133 10*3/uL — ABNORMAL LOW (ref 150–400)
RBC: 4.83 MIL/uL (ref 4.22–5.81)
RDW: 12.9 % (ref 11.5–15.5)

## 2012-05-06 LAB — TYPE AND SCREEN: Antibody Screen: NEGATIVE

## 2012-05-06 LAB — URINALYSIS, ROUTINE W REFLEX MICROSCOPIC
Hgb urine dipstick: NEGATIVE
Specific Gravity, Urine: 1.025 (ref 1.005–1.030)
Urobilinogen, UA: 0.2 mg/dL (ref 0.0–1.0)

## 2012-05-06 LAB — APTT: aPTT: 28 seconds (ref 24–37)

## 2012-05-06 LAB — BASIC METABOLIC PANEL
BUN: 25 mg/dL — ABNORMAL HIGH (ref 6–23)
CO2: 24 mEq/L (ref 19–32)
Calcium: 10 mg/dL (ref 8.4–10.5)
Creatinine, Ser: 1.05 mg/dL (ref 0.50–1.35)
Glucose, Bld: 96 mg/dL (ref 70–99)

## 2012-05-06 NOTE — Progress Notes (Signed)
Pt stated will see a cardiologist this week at [place] for clearance

## 2012-05-06 NOTE — Pre-Procedure Instructions (Signed)
David Hubbard  05/06/2012   Your procedure is scheduled on:  May 13, 2012  Report to [facility] at 5:30 AM (entrance A)  Call this number if you have problems the morning of surgery: [phone]   Remember:   Do not eat food or drink liquids after midnight.   Take these medicines the morning of surgery with A SIP OF WATER: lopressor, pain pill   Do not wear jewelry, make-up or nail polish.  Do not wear lotions, powders, or perfumes. You may wear deodorant.  Do not shave 48 hours prior to surgery. Men may shave face and neck.  Do not bring valuables to the hospital.  Contacts, dentures or bridgework may not be worn into surgery.  Leave suitcase in the car. After surgery it may be brought to your room.  For patients admitted to the hospital, checkout time is 11:00 AM the day of  discharge.   Patients discharged the day of surgery will not be allowed to drive  home.  Name and phone number of your driver:   Special Instructions: Shower using CHG 2 nights before surgery and the night before surgery.  If you shower the day of surgery use CHG.  Use special wash - you have one bottle of CHG for all showers.  You should use approximately 1/3 of the bottle for each shower.   Please read over the following fact sheets that you were given: Pain Booklet, Coughing and Deep Breathing, Blood Transfusion Information and Surgical Site Infection Prevention

## 2012-05-07 ENCOUNTER — Encounter: Payer: Self-pay | Admitting: *Deleted

## 2012-05-07 ENCOUNTER — Encounter: Payer: Self-pay | Admitting: Cardiology

## 2012-05-07 DIAGNOSIS — Z87448 Personal history of other diseases of urinary system: Secondary | ICD-10-CM | POA: Insufficient documentation

## 2012-05-07 DIAGNOSIS — I2510 Atherosclerotic heart disease of native coronary artery without angina pectoris: Secondary | ICD-10-CM | POA: Insufficient documentation

## 2012-05-07 DIAGNOSIS — G4730 Sleep apnea, unspecified: Secondary | ICD-10-CM | POA: Insufficient documentation

## 2012-05-07 DIAGNOSIS — I10 Essential (primary) hypertension: Secondary | ICD-10-CM | POA: Insufficient documentation

## 2012-05-07 DIAGNOSIS — C4490 Unspecified malignant neoplasm of skin, unspecified: Secondary | ICD-10-CM | POA: Insufficient documentation

## 2012-05-07 DIAGNOSIS — I219 Acute myocardial infarction, unspecified: Secondary | ICD-10-CM | POA: Insufficient documentation

## 2012-05-07 NOTE — Progress Notes (Addendum)
Anesthesia chart review: Patient is a 72 year old male scheduled for right TKA on 05/13/2012 by Dr. Dion Saucier. History includes obesity, CAD/MI s/p CABG '01, OSA, HTN, CKD, skin cancer, probable low-grade non-Hodgkin lymphoma.  Smoking status was not documented at his PAT visit.  Oncologist is Dr. Myna Hidalgo, last visit 02/13/12 and patient was asymptomatic at that time, though with mild thrombocytopenia.  Continued observation with six month follow-up was recommended. PCP is [name], Secondary school teacher at [employer]).  EKG on 06/20/11 showed SR, poor r wave progression, inferior infarct, negative T wave in aVL.  His PCP felt it was unchanged from prior EKG.  CXR on 05/06/12 showed: Mild hyperinflation and borderline cardiomegaly. No acute findings.  Preoperative labs noted.  WBC 12.0, PLT 133.  PT/PTT WNL.  Cr 1.05, glucose 96. UA WNL.  Patient is scheduled for a preoperative cardiology evaluation by Dr. Charlton Haws on 05/08/12.  Will follow-up records as available.  [facility]/Anesthesiology Phone [phone] 05/07/2012 1:16 PM  Addendum: 05/08/12 1705 Patient was seen by Dr. Eden Emms today.  Records indicate that he would like patient to undergo a 2 day Lexiscan prior to surgery.  This has been scheduled for 05/14/12, which would mean surgery would need to be postponed.  Dr. Fabio Bering nurse has already left a message with Dr. Shelba Flake office.

## 2012-05-08 ENCOUNTER — Ambulatory Visit (INDEPENDENT_AMBULATORY_CARE_PROVIDER_SITE_OTHER): Payer: Medicare Other | Admitting: Cardiovascular Disease

## 2012-05-08 ENCOUNTER — Telehealth: Payer: Self-pay | Admitting: *Deleted

## 2012-05-08 VITALS — BP 136/82 | HR 66 | Wt 311.0 lb

## 2012-05-08 DIAGNOSIS — Z01811 Encounter for preprocedural respiratory examination: Secondary | ICD-10-CM

## 2012-05-08 DIAGNOSIS — I10 Essential (primary) hypertension: Secondary | ICD-10-CM

## 2012-05-08 DIAGNOSIS — Z87448 Personal history of other diseases of urinary system: Secondary | ICD-10-CM

## 2012-05-08 DIAGNOSIS — Z01810 Encounter for preprocedural cardiovascular examination: Secondary | ICD-10-CM

## 2012-05-08 DIAGNOSIS — I25810 Atherosclerosis of coronary artery bypass graft(s) without angina pectoris: Secondary | ICD-10-CM

## 2012-05-08 NOTE — Assessment & Plan Note (Signed)
Cr stable and not risky for surgery 1.05

## 2012-05-08 NOTE — Progress Notes (Signed)
Patient ID: David Hubbard, male   DOB: 1940-02-03, 72 y.o.   MRN: [id number] 72 yo with CAD CABG in [place] 2001.  Previously seen by [facility] but not for years.  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 Needs right TKR with Dr Dion Saucier.  Last orthopedic surgery was 2011 shoulder with no complications. MI prior to CABG did not have classic symptoms.  Diaphoresis and arm pain.  No chest pain or symptoms now but activity limited by arthritis. Some exertional dyspnea. Has been off aspirin for surgery tentatively scheduled for Tuesday.    ROS: Denies fever, malais, weight loss, blurry vision, decreased visual acuity, cough, sputum, SOB, hemoptysis, pleuritic pain, palpitaitons, heartburn, abdominal pain, melena, lower extremity edema, claudication, or rash.  All other systems reviewed and negative   General: Affect appropriate Healthy:  appears stated age HEENT: normal Neck supple with no adenopathy JVP normal no bruits no thyromegaly Lungs clear with no wheezing and good diaphragmatic motion Heart:  S1/S2 no murmur,rub, gallop or click PMI normal Abdomen: benighn, BS positve, no tenderness, no AAA no bruit.  No HSM or HJR Distal pulses intact with no bruits No edema Neuro non-focal Skin warm and dry No muscular weakness  Medications Current Outpatient Prescriptions  Medication Sig Dispense Refill  . Ascorbic Acid (VITAMIN C CR) 1000 MG TBCR Take 1 tablet by mouth every morning.       Marland Kitchen aspirin ([employer] ASPIRIN) 81 MG EC tablet Take 81 mg by mouth daily. Swallow whole.      . B Complex Vitamins (VITAMIN B COMPLEX PO) Take 1 tablet by mouth every morning.       . Cholecalciferol (VITAMIN D-3) 5000 UNITS TABS Take 1 tablet by mouth daily.       . Coenzyme Q10 (CO Q10) 100 MG TABS Take 1 capsule by mouth every morning.       . Colesevelam HCl (WELCHOL) 3.75 G PACK Take 1 packet by mouth daily.      . Cranberry 250 MG TABS Take 1 tablet by mouth every morning.       . diclofenac (VOLTAREN) 75 MG  EC tablet Take 75 mg by mouth daily.       Marland Kitchen DIOVAN 160 MG tablet Take 160 mg by mouth daily.       . diphenhydrAMINE (BENADRYL) 25 mg capsule Take 25 mg by mouth at bedtime as needed for allergies.      . Grape Seed (RA GRAPE SEED) 100 MG CAPS Take 1 capsule by mouth every morning.       . loratadine (CLARITIN) 10 MG tablet Take 10 mg by mouth daily.      . metoprolol tartrate (LOPRESSOR) 25 MG tablet Take 25 mg by mouth 2 (two) times daily.       . Misc Natural Products (PROSTATE HEALTH) CAPS Take 1 capsule by mouth every morning.       . Multiple Vitamins-Minerals (MENS MULTI VITAMIN & MINERAL PO) Take 1 tablet by mouth every morning.       . Omega-3 Fatty Acids (FISH OIL BURP-LESS) 1200 MG CAPS Take 2 capsules by mouth 2 (two) times daily.       Marland Kitchen Resveratrol 250 MG CAPS Take 1 capsule by mouth daily.      . traMADol (ULTRAM) 50 MG tablet 50 mg every 8 (eight) hours as needed for pain.        No current facility-administered medications for this visit.    Allergies Contrast media  Family  History: No family history on file.  Social History: History   Social History  . Marital Status: Divorced    Spouse Name: N/A    Number of Children: N/A  . Years of Education: N/A   Occupational History  . Not on file.   Social History Main Topics  . Smoking status: Not on file  . Smokeless tobacco: Not on file  . Alcohol Use: Not on file  . Drug Use: Not on file  . Sexually Active: Not on file   Other Topics Concern  . Not on file   Social History Narrative  . No narrative on file    Electrocardiogram:  SR rate 62 Old IMI poor R wave progression  06/15/09  Assessment and Plan

## 2012-05-08 NOTE — Telephone Encounter (Signed)
LM  FOR [name] AT DR LANDAU'S OFFICE TO CALL BACK RE  PT  NEEDING TO  HAVE SX POSTPONED UNTIL AFTER 2 DAY LEXISCAN IS  DONE .Zack Seal

## 2012-05-08 NOTE — Assessment & Plan Note (Signed)
Well controlled.  Continue current medications and low sodium Dash type diet.    

## 2012-05-08 NOTE — Patient Instructions (Addendum)
Your physician wants you to follow-up in:   6 MONTHS WITH DR NISHAN' You will receive a reminder letter in the mail two months in advance. If you don't receive a letter, please call our office to schedule the follow-up appointment. Your physician recommends that you continue on your current medications as directed. Please refer to the Current Medication list given to you today.   Your physician has requested that you have a lexiscan myoview. For further information please visit [email or web address]. Please follow instruction sheet, as given.  

## 2012-05-09 NOTE — Telephone Encounter (Signed)
David Hubbard DR [facility] OFFICE AWARE TO RESCHEDULE  PT'S PROCEDURE  TO SOMETIME AFTER  05-15-12  PENDING LEXISCAN RESULTS./CY

## 2012-05-12 ENCOUNTER — Telehealth: Payer: Self-pay | Admitting: Cardiology

## 2012-05-12 NOTE — Telephone Encounter (Signed)
Records rec From [facility] gave to [facility] [place] 05/12/12/KM

## 2012-05-14 ENCOUNTER — Ambulatory Visit (HOSPITAL_COMMUNITY): Payer: Medicare Other | Attending: Cardiovascular Disease | Admitting: Radiology

## 2012-05-14 VITALS — BP 114/63 | Ht 74.0 in | Wt 304.0 lb

## 2012-05-14 DIAGNOSIS — I10 Essential (primary) hypertension: Secondary | ICD-10-CM | POA: Insufficient documentation

## 2012-05-14 DIAGNOSIS — R0989 Other specified symptoms and signs involving the circulatory and respiratory systems: Secondary | ICD-10-CM | POA: Insufficient documentation

## 2012-05-14 DIAGNOSIS — I2510 Atherosclerotic heart disease of native coronary artery without angina pectoris: Secondary | ICD-10-CM

## 2012-05-14 DIAGNOSIS — Z87891 Personal history of nicotine dependence: Secondary | ICD-10-CM | POA: Insufficient documentation

## 2012-05-14 DIAGNOSIS — I25810 Atherosclerosis of coronary artery bypass graft(s) without angina pectoris: Secondary | ICD-10-CM

## 2012-05-14 DIAGNOSIS — R5383 Other fatigue: Secondary | ICD-10-CM | POA: Insufficient documentation

## 2012-05-14 DIAGNOSIS — R0602 Shortness of breath: Secondary | ICD-10-CM

## 2012-05-14 DIAGNOSIS — Z8249 Family history of ischemic heart disease and other diseases of the circulatory system: Secondary | ICD-10-CM | POA: Insufficient documentation

## 2012-05-14 DIAGNOSIS — I739 Peripheral vascular disease, unspecified: Secondary | ICD-10-CM | POA: Insufficient documentation

## 2012-05-14 DIAGNOSIS — Z01810 Encounter for preprocedural cardiovascular examination: Secondary | ICD-10-CM | POA: Insufficient documentation

## 2012-05-14 DIAGNOSIS — R0609 Other forms of dyspnea: Secondary | ICD-10-CM | POA: Insufficient documentation

## 2012-05-14 DIAGNOSIS — R5381 Other malaise: Secondary | ICD-10-CM | POA: Insufficient documentation

## 2012-05-14 MED ORDER — TECHNETIUM TC 99M SESTAMIBI GENERIC - CARDIOLITE
33.0000 | Freq: Once | INTRAVENOUS | Status: AC | PRN
  Administered 2012-05-14: 33 via INTRAVENOUS

## 2012-05-14 MED ORDER — REGADENOSON 0.4 MG/5ML IV SOLN
0.4000 mg | Freq: Once | INTRAVENOUS | Status: AC
  Administered 2012-05-14: 0.4 mg via INTRAVENOUS

## 2012-05-14 NOTE — Progress Notes (Signed)
[facility] SITE 3 NUCLEAR MED [address] [place], [place] [address] [phone]    Cardiology Nuclear Med Study  David Hubbard is a 72 y.o. male     MRN : [id number]     DOB: August 31, 1940  Procedure Date: 05/14/2012  Nuclear Med Background Indication for Stress Test:  Evaluation for Ischemia, Graft Patency, and Surgical Clearance: Pending (R) TKA on 05-26-12 by Dr. 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 IV 0.9% NS with Angio Cath:  22g  IV Site: R Hand  IV Started by:  Bonnita Levan, RN  Chest Size (in):  50+ Cup Size: n/a  Height: 6\' 2"  (1.88 m)  Weight:  304 lb (137.893 kg)  BMI:  Body mass index is 39.01 kg/(m^2). Tech Comments:  N/A    Nuclear Med Study 1 or 2 day study: 2 day  Stress Test Type:  Lexiscan  Reading MD: Kristeen Miss, MD  Order Authorizing Provider:  Charlton Haws, MD  Resting Radionuclide: Technetium 40m Sestamibi  Resting Radionuclide Dose: 33.0 mCi on 05/15/12   Stress Radionuclide:  Technetium 14m Sestamibi  Stress Radionuclide Dose: 33.0 mCi on 05/14/12           Stress Protocol Rest HR: 55 Stress HR: 69  Rest BP: 114/63 Stress BP: 124/62  Exercise Time (min): n/a METS: n/a   Predicted Max HR: 151 bpm % Max HR: 45.7 bpm Rate Pressure Product: 8556   Dose of Adenosine (mg):  n/a Dose of Lexiscan: 0.4 mg  Dose of Atropine (mg): n/a Dose of Dobutamine: n/a mcg/kg/min (at max HR)  Stress Test Technologist: Irean Hong, RN  Nuclear Technologist:  Domenic Polite, CNMT     Rest Procedure:  Myocardial perfusion imaging was performed at rest 45 minutes following the intravenous administration  of Technetium 28m Sestamibi. Rest ECG: NSR with non-specific ST-T wave changes  Stress Procedure:  The patient received IV Lexiscan 0.4 mg over 15-seconds.  Technetium 33m Sestamibi injected at 30-seconds. The patient complained of SOB, nausea, and lightheadedness. Quantitative spect images were obtained after a 45 minute delay. Stress ECG: No significant change from baseline ECG  QPS Raw Data Images:  There is interference from nuclear activity from structures below the diaphragm. This does not affect the ability to read the study.  There is some diaphragmatic attenuation as well.  Stress Images:  Fairly normal uptake in all regions.  Rest Images:  Fairly normal uptake in all regions.  Subtraction (SDS):  No evidence of ischemia. There is some mild inhomogeneous areas due to uptake in the structures below the diaphragm and the diaphragmatic  Attenuation. Transient Ischemic Dilatation (Normal <1.22):  0.93 Lung/Heart Ratio (Normal <0.45):  0.45  Quantitative Gated Spect Images QGS EDV:  118 ml QGS ESV:  54 ml  Impression Exercise Capacity:  Lexiscan with no exercise. BP Response:  Normal blood pressure response. Clinical Symptoms:  No significant symptoms noted. ECG Impression:  No significant ST segment change suggestive of ischemia. Comparison with Prior Nuclear Study: No images to compare  Overall Impression:  Normal stress nuclear study.  LV Ejection Fraction: 54%.  LV Wall Motion:  NL LV  Function; NL Wall Motion.   Vesta Mixer, Montez Hageman., MD, [facility] 05/15/2012, 4:40 PM Office - [phone] Pager [phone]

## 2012-05-15 ENCOUNTER — Encounter: Payer: Self-pay | Admitting: Cardiovascular Disease

## 2012-05-15 ENCOUNTER — Ambulatory Visit (HOSPITAL_COMMUNITY): Payer: Medicare Other

## 2012-05-15 DIAGNOSIS — R0989 Other specified symptoms and signs involving the circulatory and respiratory systems: Secondary | ICD-10-CM

## 2012-05-15 MED ORDER — TECHNETIUM TC 99M SESTAMIBI GENERIC - CARDIOLITE
30.0000 | Freq: Once | INTRAVENOUS | Status: AC | PRN
  Administered 2012-05-15: 30 via INTRAVENOUS

## 2012-05-23 ENCOUNTER — Other Ambulatory Visit: Payer: Self-pay | Admitting: Orthopedic Surgery

## 2012-05-25 MED ORDER — DEXTROSE 5 % IV SOLN
3.0000 g | INTRAVENOUS | Status: DC
  Filled 2012-05-25: qty 3000

## 2012-05-26 ENCOUNTER — Inpatient Hospital Stay (HOSPITAL_COMMUNITY): Payer: Medicare Other

## 2012-05-26 ENCOUNTER — Inpatient Hospital Stay (HOSPITAL_COMMUNITY): Payer: Medicare Other | Admitting: Anesthesiology

## 2012-05-26 ENCOUNTER — Encounter (HOSPITAL_COMMUNITY): Payer: Self-pay | Admitting: Vascular Surgery

## 2012-05-26 ENCOUNTER — Encounter (HOSPITAL_COMMUNITY): Payer: Self-pay | Admitting: Anesthesiology

## 2012-05-26 ENCOUNTER — Inpatient Hospital Stay (HOSPITAL_COMMUNITY)
Admission: RE | Admit: 2012-05-26 | Discharge: 2012-05-29 | DRG: 470 | Disposition: A | Discharge status: Home Health | Payer: Medicare Other | Source: Ambulatory Visit | Attending: Orthopedic Surgery | Admitting: Orthopedic Surgery

## 2012-05-26 ENCOUNTER — Encounter (HOSPITAL_COMMUNITY)
Admission: RE | Disposition: A | Discharge status: Home Health | Payer: Self-pay | Source: Ambulatory Visit | Attending: Orthopedic Surgery

## 2012-05-26 DIAGNOSIS — M545 Low back pain, unspecified: Secondary | ICD-10-CM | POA: Diagnosis present

## 2012-05-26 DIAGNOSIS — F329 Major depressive disorder, single episode, unspecified: Secondary | ICD-10-CM | POA: Diagnosis present

## 2012-05-26 DIAGNOSIS — I2510 Atherosclerotic heart disease of native coronary artery without angina pectoris: Secondary | ICD-10-CM | POA: Diagnosis present

## 2012-05-26 DIAGNOSIS — Z96659 Presence of unspecified artificial knee joint: Secondary | ICD-10-CM

## 2012-05-26 DIAGNOSIS — G4730 Sleep apnea, unspecified: Secondary | ICD-10-CM | POA: Diagnosis present

## 2012-05-26 DIAGNOSIS — E7800 Pure hypercholesterolemia, unspecified: Secondary | ICD-10-CM | POA: Diagnosis present

## 2012-05-26 DIAGNOSIS — M1710 Unilateral primary osteoarthritis, unspecified knee: Principal | ICD-10-CM | POA: Diagnosis present

## 2012-05-26 DIAGNOSIS — Z79899 Other long term (current) drug therapy: Secondary | ICD-10-CM

## 2012-05-26 DIAGNOSIS — I10 Essential (primary) hypertension: Secondary | ICD-10-CM | POA: Diagnosis present

## 2012-05-26 DIAGNOSIS — Z951 Presence of aortocoronary bypass graft: Secondary | ICD-10-CM

## 2012-05-26 DIAGNOSIS — Z7901 Long term (current) use of anticoagulants: Secondary | ICD-10-CM

## 2012-05-26 DIAGNOSIS — M1711 Unilateral primary osteoarthritis, right knee: Secondary | ICD-10-CM

## 2012-05-26 DIAGNOSIS — Z6839 Body mass index (BMI) 39.0-39.9, adult: Secondary | ICD-10-CM

## 2012-05-26 DIAGNOSIS — G8929 Other chronic pain: Secondary | ICD-10-CM | POA: Diagnosis present

## 2012-05-26 DIAGNOSIS — Z7982 Long term (current) use of aspirin: Secondary | ICD-10-CM

## 2012-05-26 DIAGNOSIS — F411 Generalized anxiety disorder: Secondary | ICD-10-CM | POA: Diagnosis present

## 2012-05-26 DIAGNOSIS — I252 Old myocardial infarction: Secondary | ICD-10-CM

## 2012-05-26 DIAGNOSIS — C8589 Other specified types of non-Hodgkin lymphoma, extranodal and solid organ sites: Secondary | ICD-10-CM | POA: Diagnosis present

## 2012-05-26 DIAGNOSIS — F3289 Other specified depressive episodes: Secondary | ICD-10-CM | POA: Diagnosis present

## 2012-05-26 HISTORY — DX: Unilateral primary osteoarthritis, right knee: M17.11

## 2012-05-26 HISTORY — DX: Calculus of kidney: N20.0

## 2012-05-26 HISTORY — DX: Angina pectoris, unspecified: I20.9

## 2012-05-26 HISTORY — PX: TOTAL KNEE ARTHROPLASTY: SHX125

## 2012-05-26 HISTORY — DX: Low back pain: M54.5

## 2012-05-26 HISTORY — DX: Low back pain, unspecified: M54.50

## 2012-05-26 HISTORY — DX: Other chronic pain: G89.29

## 2012-05-26 HISTORY — DX: Major depressive disorder, single episode, unspecified: F32.9

## 2012-05-26 HISTORY — DX: Anxiety disorder, unspecified: F41.9

## 2012-05-26 HISTORY — DX: Depression, unspecified: F32.A

## 2012-05-26 HISTORY — DX: Pure hypercholesterolemia, unspecified: E78.00

## 2012-05-26 LAB — CBC
HCT: 43.9 % (ref 39.0–52.0)
Platelets: 141 10*3/uL — ABNORMAL LOW (ref 150–400)
RBC: 4.78 MIL/uL (ref 4.22–5.81)
RDW: 12.7 % (ref 11.5–15.5)
WBC: 9.6 10*3/uL (ref 4.0–10.5)

## 2012-05-26 LAB — BASIC METABOLIC PANEL
CO2: 24 mEq/L (ref 19–32)
Chloride: 103 mEq/L (ref 96–112)
Creatinine, Ser: 0.94 mg/dL (ref 0.50–1.35)
GFR calc Af Amer: >90 mL/min (ref >90)
Sodium: 139 mEq/L (ref 135–145)

## 2012-05-26 SURGERY — ARTHROPLASTY, KNEE, TOTAL
Anesthesia: Spinal | Site: Knee | Laterality: Right | Wound class: Clean

## 2012-05-26 MED ORDER — ALUM & MAG HYDROXIDE-SIMETH 200-200-20 MG/5ML PO SUSP: 30.0000 mL | ORAL | Status: DC | PRN

## 2012-05-26 MED ORDER — ALBUMIN HUMAN 5 % IV SOLN
INTRAVENOUS | Status: DC | PRN
  Administered 2012-05-26: 14:00:00 via INTRAVENOUS

## 2012-05-26 MED ORDER — HYDROMORPHONE HCL PF 1 MG/ML IJ SOLN
1.0000 mg | INTRAMUSCULAR | Status: DC | PRN
  Administered 2012-05-26 – 2012-05-27 (×2): 1 mg via INTRAVENOUS
  Filled 2012-05-26 (×2): qty 1

## 2012-05-26 MED ORDER — SODIUM CHLORIDE 0.9 % IV SOLN
10.0000 mg | INTRAVENOUS | Status: DC | PRN
  Administered 2012-05-26: 60 ug/min via INTRAVENOUS

## 2012-05-26 MED ORDER — FENTANYL CITRATE 0.05 MG/ML IJ SOLN
INTRAMUSCULAR | Status: DC | PRN
  Administered 2012-05-26: 25 ug via INTRAVENOUS
  Administered 2012-05-26: 50 ug via INTRAVENOUS
  Administered 2012-05-26: 25 ug via INTRAVENOUS
  Administered 2012-05-26: 50 ug via INTRAVENOUS

## 2012-05-26 MED ORDER — HYDROMORPHONE HCL PF 1 MG/ML IJ SOLN
INTRAMUSCULAR | Status: AC
  Filled 2012-05-26: qty 1

## 2012-05-26 MED ORDER — PROMETHAZINE HCL 25 MG/ML IJ SOLN: 6.2500 mg | INTRAMUSCULAR | Status: DC | PRN

## 2012-05-26 MED ORDER — SODIUM CHLORIDE 0.9 % IR SOLN
Status: DC | PRN
  Administered 2012-05-26: 1000 mL
  Administered 2012-05-26: 3000 mL

## 2012-05-26 MED ORDER — METOPROLOL TARTRATE 25 MG PO TABS
25.0000 mg | ORAL_TABLET | ORAL | Status: AC
  Administered 2012-05-26: 25 mg via ORAL
  Filled 2012-05-26: qty 1

## 2012-05-26 MED ORDER — PROPOFOL 10 MG/ML IV EMUL: 5.0000 ug/kg/min | INTRAVENOUS | Status: DC

## 2012-05-26 MED ORDER — ASPIRIN EC 81 MG PO TBEC
81.0000 mg | DELAYED_RELEASE_TABLET | Freq: Every day | ORAL | Status: DC
  Administered 2012-05-27 – 2012-05-29 (×3): 81 mg via ORAL
  Filled 2012-05-26 (×3): qty 1

## 2012-05-26 MED ORDER — ENOXAPARIN SODIUM 30 MG/0.3ML ~~LOC~~ SOLN: 30.0000 mg | Freq: Two times a day (BID) | SUBCUTANEOUS | Status: DC

## 2012-05-26 MED ORDER — PROPOFOL INFUSION 10 MG/ML OPTIME
INTRAVENOUS | Status: DC | PRN
  Administered 2012-05-26: 25 ug/kg/min via INTRAVENOUS

## 2012-05-26 MED ORDER — LACTATED RINGERS IV SOLN
INTRAVENOUS | Status: DC
  Administered 2012-05-26 (×2): via INTRAVENOUS

## 2012-05-26 MED ORDER — HYDROMORPHONE HCL PF 1 MG/ML IJ SOLN
0.2500 mg | INTRAMUSCULAR | Status: DC | PRN
  Administered 2012-05-26 (×4): 0.5 mg via INTRAVENOUS

## 2012-05-26 MED ORDER — ONDANSETRON HCL 4 MG PO TABS: 4.0000 mg | ORAL_TABLET | Freq: Four times a day (QID) | ORAL | Status: DC | PRN

## 2012-05-26 MED ORDER — METOCLOPRAMIDE HCL 10 MG PO TABS: 5.0000 mg | ORAL_TABLET | Freq: Three times a day (TID) | ORAL | Status: DC | PRN

## 2012-05-26 MED ORDER — LORATADINE 10 MG PO TABS
10.0000 mg | ORAL_TABLET | Freq: Every day | ORAL | Status: DC
  Administered 2012-05-27 – 2012-05-29 (×3): 10 mg via ORAL
  Filled 2012-05-26 (×3): qty 1

## 2012-05-26 MED ORDER — DOCUSATE SODIUM 100 MG PO CAPS
100.0000 mg | ORAL_CAPSULE | Freq: Two times a day (BID) | ORAL | Status: DC
  Administered 2012-05-27 – 2012-05-29 (×5): 100 mg via ORAL
  Filled 2012-05-26 (×6): qty 1

## 2012-05-26 MED ORDER — DEXTROSE 5 % IV SOLN
INTRAVENOUS | Status: DC | PRN
  Administered 2012-05-26: 14:00:00 via INTRAVENOUS

## 2012-05-26 MED ORDER — ONDANSETRON HCL 4 MG/2ML IJ SOLN
INTRAMUSCULAR | Status: DC | PRN
  Administered 2012-05-26: 4 mg via INTRAVENOUS

## 2012-05-26 MED ORDER — ASPIRIN 81 MG PO TBEC: 81.0000 mg | DELAYED_RELEASE_TABLET | Freq: Every day | ORAL | Status: DC

## 2012-05-26 MED ORDER — METOPROLOL TARTRATE 25 MG PO TABS
25.0000 mg | ORAL_TABLET | Freq: Two times a day (BID) | ORAL | Status: DC
  Administered 2012-05-26 – 2012-05-29 (×6): 25 mg via ORAL
  Filled 2012-05-26 (×7): qty 1

## 2012-05-26 MED ORDER — MEPERIDINE HCL 25 MG/ML IJ SOLN: 6.2500 mg | INTRAMUSCULAR | Status: DC | PRN

## 2012-05-26 MED ORDER — POTASSIUM CHLORIDE IN NACL 20-0.45 MEQ/L-% IV SOLN
INTRAVENOUS | Status: DC
  Administered 2012-05-27: 13:00:00 via INTRAVENOUS
  Filled 2012-05-26 (×6): qty 1000

## 2012-05-26 MED ORDER — KETOROLAC TROMETHAMINE 30 MG/ML IJ SOLN
INTRAMUSCULAR | Status: AC
  Administered 2012-05-26: 15 mg
  Filled 2012-05-26: qty 1

## 2012-05-26 MED ORDER — CEFAZOLIN SODIUM-DEXTROSE 2-3 GM-% IV SOLR: 2.0000 g | INTRAVENOUS | Status: DC

## 2012-05-26 MED ORDER — ZOLPIDEM TARTRATE 5 MG PO TABS
5.0000 mg | ORAL_TABLET | Freq: Every evening | ORAL | Status: DC | PRN
  Administered 2012-05-28: 5 mg via ORAL
  Filled 2012-05-26: qty 1

## 2012-05-26 MED ORDER — RESVERATROL 250 MG PO CAPS: 1.0000 | ORAL_CAPSULE | Freq: Every day | ORAL | Status: DC

## 2012-05-26 MED ORDER — ACETAMINOPHEN 10 MG/ML IV SOLN
1000.0000 mg | Freq: Four times a day (QID) | INTRAVENOUS | Status: AC
  Administered 2012-05-26 – 2012-05-27 (×4): 1000 mg via INTRAVENOUS
  Filled 2012-05-26 (×3): qty 100

## 2012-05-26 MED ORDER — COLESEVELAM HCL 625 MG PO TABS
3750.0000 mg | ORAL_TABLET | Freq: Every day | ORAL | Status: DC
  Filled 2012-05-26 (×2): qty 6

## 2012-05-26 MED ORDER — EPHEDRINE SULFATE 50 MG/ML IJ SOLN
INTRAMUSCULAR | Status: DC | PRN
  Administered 2012-05-26: 5 mg via INTRAVENOUS

## 2012-05-26 MED ORDER — OXYCODONE HCL 5 MG/5ML PO SOLN: 5.0000 mg | Freq: Once | ORAL | Status: AC | PRN

## 2012-05-26 MED ORDER — COLESEVELAM HCL 3.75 G PO PACK: 1.0000 | PACK | Freq: Every day | ORAL | Status: DC

## 2012-05-26 MED ORDER — IRBESARTAN 150 MG PO TABS
150.0000 mg | ORAL_TABLET | Freq: Every day | ORAL | Status: DC
  Administered 2012-05-27 – 2012-05-29 (×3): 150 mg via ORAL
  Filled 2012-05-26 (×3): qty 1

## 2012-05-26 MED ORDER — LIDOCAINE HCL (PF) 1 % IJ SOLN
INTRAMUSCULAR | Status: AC
  Filled 2012-05-26: qty 30

## 2012-05-26 MED ORDER — METHOCARBAMOL 500 MG PO TABS
500.0000 mg | ORAL_TABLET | Freq: Four times a day (QID) | ORAL | Status: DC | PRN
  Administered 2012-05-26 – 2012-05-29 (×4): 500 mg via ORAL
  Filled 2012-05-26 (×5): qty 1

## 2012-05-26 MED ORDER — ACETAMINOPHEN 650 MG RE SUPP: 650.0000 mg | Freq: Four times a day (QID) | RECTAL | Status: DC | PRN

## 2012-05-26 MED ORDER — OXYCODONE HCL 5 MG PO TABS
5.0000 mg | ORAL_TABLET | ORAL | Status: DC | PRN
Start: 2012-05-26 — End: 2012-05-29
  Administered 2012-05-27 (×4): 10 mg via ORAL
  Administered 2012-05-28: 5 mg via ORAL
  Administered 2012-05-28 – 2012-05-29 (×3): 10 mg via ORAL
  Filled 2012-05-26 (×4): qty 2
  Filled 2012-05-26: qty 1
  Filled 2012-05-26 (×3): qty 2

## 2012-05-26 MED ORDER — MENTHOL 3 MG MT LOZG: 1.0000 | LOZENGE | OROMUCOSAL | Status: DC | PRN

## 2012-05-26 MED ORDER — BUPIVACAINE IN DEXTROSE 0.75-8.25 % IT SOLN
INTRATHECAL | Status: DC | PRN
  Administered 2012-05-26: 15 mg via INTRATHECAL

## 2012-05-26 MED ORDER — PHENOL 1.4 % MT LIQD: 1.0000 | OROMUCOSAL | Status: DC | PRN

## 2012-05-26 MED ORDER — ONDANSETRON HCL 4 MG/2ML IJ SOLN: 4.0000 mg | Freq: Four times a day (QID) | INTRAMUSCULAR | Status: DC | PRN

## 2012-05-26 MED ORDER — DIPHENHYDRAMINE HCL 25 MG PO CAPS
25.0000 mg | ORAL_CAPSULE | Freq: Every evening | ORAL | Status: DC | PRN
  Administered 2012-05-28: 25 mg via ORAL
  Filled 2012-05-26: qty 1

## 2012-05-26 MED ORDER — ACETAMINOPHEN 325 MG PO TABS
650.0000 mg | ORAL_TABLET | Freq: Four times a day (QID) | ORAL | Status: DC | PRN
  Administered 2012-05-28 (×2): 650 mg via ORAL
  Filled 2012-05-26 (×2): qty 2

## 2012-05-26 MED ORDER — OXYCODONE HCL 5 MG PO TABS
ORAL_TABLET | ORAL | Status: AC
  Administered 2012-05-26: 5 mg via ORAL
  Filled 2012-05-26: qty 1

## 2012-05-26 MED ORDER — KETOROLAC TROMETHAMINE 30 MG/ML IJ SOLN
7.5000 mg | Freq: Four times a day (QID) | INTRAMUSCULAR | Status: AC
  Administered 2012-05-26 – 2012-05-27 (×4): 7.5 mg via INTRAVENOUS
  Filled 2012-05-26 (×3): qty 1

## 2012-05-26 MED ORDER — ACETAMINOPHEN 10 MG/ML IV SOLN
INTRAVENOUS | Status: AC
  Filled 2012-05-26: qty 100

## 2012-05-26 MED ORDER — POLYETHYLENE GLYCOL 3350 17 G PO PACK
17.0000 g | PACK | Freq: Every day | ORAL | Status: DC | PRN
  Filled 2012-05-26 (×2): qty 1

## 2012-05-26 MED ORDER — FLEET ENEMA 7-19 GM/118ML RE ENEM: 1.0000 | ENEMA | Freq: Once | RECTAL | Status: AC | PRN

## 2012-05-26 MED ORDER — BISACODYL 10 MG RE SUPP: 10.0000 mg | Freq: Every day | RECTAL | Status: DC | PRN

## 2012-05-26 MED ORDER — OXYCODONE HCL 5 MG PO TABS: 5.0000 mg | ORAL_TABLET | Freq: Once | ORAL | Status: AC | PRN

## 2012-05-26 MED ORDER — METHOCARBAMOL 100 MG/ML IJ SOLN
500.0000 mg | Freq: Four times a day (QID) | INTRAVENOUS | Status: DC | PRN
  Filled 2012-05-26: qty 5

## 2012-05-26 MED ORDER — SENNA 8.6 MG PO TABS
1.0000 | ORAL_TABLET | Freq: Two times a day (BID) | ORAL | Status: DC
  Administered 2012-05-27 – 2012-05-29 (×5): 8.6 mg via ORAL
  Filled 2012-05-26 (×6): qty 1

## 2012-05-26 MED ORDER — BUPIVACAINE HCL (PF) 0.25 % IJ SOLN
INTRAMUSCULAR | Status: AC
  Filled 2012-05-26: qty 30

## 2012-05-26 MED ORDER — DEXTROSE 5 % IV SOLN
3.0000 g | INTRAVENOUS | Status: DC | PRN
  Administered 2012-05-26: 3 g via INTRAVENOUS

## 2012-05-26 MED ORDER — CEFAZOLIN SODIUM-DEXTROSE 2-3 GM-% IV SOLR
2.0000 g | Freq: Four times a day (QID) | INTRAVENOUS | Status: AC
  Administered 2012-05-26 – 2012-05-27 (×2): 2 g via INTRAVENOUS
  Filled 2012-05-26 (×2): qty 50

## 2012-05-26 MED ORDER — OXYCODONE-ACETAMINOPHEN 10-325 MG PO TABS: 1.0000 | ORAL_TABLET | Freq: Four times a day (QID) | ORAL | Status: DC | PRN

## 2012-05-26 MED ORDER — MIDAZOLAM HCL 5 MG/5ML IJ SOLN
INTRAMUSCULAR | Status: DC | PRN
  Administered 2012-05-26: 1 mg via INTRAVENOUS
  Administered 2012-05-26: 0.5 mg via INTRAVENOUS
  Administered 2012-05-26: 1 mg via INTRAVENOUS
  Administered 2012-05-26: 0.5 mg via INTRAVENOUS
  Administered 2012-05-26: 1 mg via INTRAVENOUS

## 2012-05-26 MED ORDER — SENNA-DOCUSATE SODIUM 8.6-50 MG PO TABS: 2.0000 | ORAL_TABLET | Freq: Every day | ORAL | Status: DC

## 2012-05-26 MED ORDER — ENOXAPARIN SODIUM 30 MG/0.3ML ~~LOC~~ SOLN
30.0000 mg | Freq: Two times a day (BID) | SUBCUTANEOUS | Status: DC
  Administered 2012-05-27 – 2012-05-29 (×5): 30 mg via SUBCUTANEOUS
  Filled 2012-05-26 (×7): qty 0.3

## 2012-05-26 MED ORDER — METOCLOPRAMIDE HCL 5 MG/ML IJ SOLN: 5.0000 mg | Freq: Three times a day (TID) | INTRAMUSCULAR | Status: DC | PRN

## 2012-05-26 SURGICAL SUPPLY — 60 items
BANDAGE ELASTIC 6 VELCRO ST LF (GAUZE/BANDAGES/DRESSINGS) ×4 IMPLANT
BANDAGE ESMARK 6X9 LF (GAUZE/BANDAGES/DRESSINGS) ×1 IMPLANT
BENZOIN TINCTURE PRP APPL 2/3 (GAUZE/BANDAGES/DRESSINGS) ×2 IMPLANT
BLADE SAG 18X100X1.27 (BLADE) ×2 IMPLANT
BLADE SAW RECIP 87.9 MT (BLADE) ×2 IMPLANT
BLADE SAW SGTL 13X75X1.27 (BLADE) ×2 IMPLANT
BNDG ESMARK 6X9 LF (GAUZE/BANDAGES/DRESSINGS) ×2
BOOTCOVER CLEANROOM LRG (PROTECTIVE WEAR) ×4 IMPLANT
BOWL SMART MIX CTS (DISPOSABLE) ×2 IMPLANT
CEMENT HV SMART SET (Cement) ×4 IMPLANT
CLOTH BEACON ORANGE TIMEOUT ST (SAFETY) ×2 IMPLANT
CLSR STERI-STRIP ANTIMIC 1/2X4 (GAUZE/BANDAGES/DRESSINGS) ×2 IMPLANT
COVER SURGICAL LIGHT HANDLE (MISCELLANEOUS) ×2 IMPLANT
CUFF TOURNIQUET SINGLE 34IN LL (TOURNIQUET CUFF) ×2 IMPLANT
DRAPE EXTREMITY T 121X128X90 (DRAPE) ×2 IMPLANT
DRAPE PROXIMA HALF (DRAPES) ×2 IMPLANT
DRAPE U-SHAPE 47X51 STRL (DRAPES) ×2 IMPLANT
DRSG PAD ABDOMINAL 8X10 ST (GAUZE/BANDAGES/DRESSINGS) ×2 IMPLANT
DURAPREP 26ML APPLICATOR (WOUND CARE) ×4 IMPLANT
ELECT CAUTERY BLADE 6.4 (BLADE) ×2 IMPLANT
ELECT REM PT RETURN 9FT ADLT (ELECTROSURGICAL) ×2
ELECTRODE REM PT RTRN 9FT ADLT (ELECTROSURGICAL) ×1 IMPLANT
FACESHIELD LNG OPTICON STERILE (SAFETY) ×2 IMPLANT
GLOVE BIOGEL PI ORTHO PRO SZ7 (GLOVE) ×1
GLOVE BIOGEL PI ORTHO PRO SZ8 (GLOVE) ×2
GLOVE ORTHO TXT STRL SZ7.5 (GLOVE) ×2 IMPLANT
GLOVE PI ORTHO PRO STRL SZ7 (GLOVE) ×1 IMPLANT
GLOVE PI ORTHO PRO STRL SZ8 (GLOVE) ×2 IMPLANT
GLOVE SURG ORTHO 8.0 STRL STRW (GLOVE) ×2 IMPLANT
GLOVE SURG SS PI 7.0 STRL IVOR (GLOVE) ×4 IMPLANT
GOWN BRE IMP PREV XXLGXLNG (GOWN DISPOSABLE) ×2 IMPLANT
GOWN STRL REIN XL XLG (GOWN DISPOSABLE) ×4 IMPLANT
HANDPIECE INTERPULSE COAX TIP (DISPOSABLE) ×1
HOOD PEEL AWAY FACE SHEILD DIS (HOOD) ×4 IMPLANT
IMMOBILIZER KNEE 22 UNIV (SOFTGOODS) ×2 IMPLANT
KIT BASIN OR (CUSTOM PROCEDURE TRAY) ×2 IMPLANT
KIT ROOM TURNOVER OR (KITS) ×2 IMPLANT
MANIFOLD NEPTUNE II (INSTRUMENTS) ×2 IMPLANT
NS IRRIG 1000ML POUR BTL (IV SOLUTION) ×2 IMPLANT
PACK TOTAL JOINT (CUSTOM PROCEDURE TRAY) ×2 IMPLANT
PAD ARMBOARD 7.5X6 YLW CONV (MISCELLANEOUS) ×4 IMPLANT
PAD CAST 4YDX4 CTTN HI CHSV (CAST SUPPLIES) ×1 IMPLANT
PADDING CAST COTTON 4X4 STRL (CAST SUPPLIES) ×1
PADDING CAST COTTON 6X4 STRL (CAST SUPPLIES) ×2 IMPLANT
SET HNDPC FAN SPRY TIP SCT (DISPOSABLE) ×1 IMPLANT
SPONGE GAUZE 4X4 12PLY (GAUZE/BANDAGES/DRESSINGS) ×2 IMPLANT
STAPLER VISISTAT 35W (STAPLE) IMPLANT
STRIP CLOSURE SKIN 1/2X4 (GAUZE/BANDAGES/DRESSINGS) ×2 IMPLANT
SUCTION FRAZIER TIP 10 FR DISP (SUCTIONS) ×2 IMPLANT
SUT MNCRL AB 4-0 PS2 18 (SUTURE) ×2 IMPLANT
SUT VIC AB 0 CT1 27 (SUTURE) ×1
SUT VIC AB 0 CT1 27XBRD ANBCTR (SUTURE) ×1 IMPLANT
SUT VIC AB 2-0 CT1 27 (SUTURE) ×1
SUT VIC AB 2-0 CT1 TAPERPNT 27 (SUTURE) ×1 IMPLANT
SUT VIC AB 3-0 SH 8-18 (SUTURE) ×4 IMPLANT
SYR 30ML LL (SYRINGE) ×2 IMPLANT
TOWEL OR 17X24 6PK STRL BLUE (TOWEL DISPOSABLE) ×2 IMPLANT
TOWEL OR 17X26 10 PK STRL BLUE (TOWEL DISPOSABLE) ×2 IMPLANT
TRAY FOLEY CATH 14FR (SET/KITS/TRAYS/PACK) ×2 IMPLANT
WATER STERILE IRR 1000ML POUR (IV SOLUTION) ×4 IMPLANT

## 2012-05-26 NOTE — Transfer of Care (Signed)
Immediate Anesthesia Transfer of Care Note  Patient: David Hubbard  Procedure(s) Performed: Procedure(s): TOTAL KNEE ARTHROPLASTY- right (Right)  Patient Location: PACU  Anesthesia Type:Spinal  Level of Consciousness: awake, alert  and patient cooperative  Airway & Oxygen Therapy: Patient Spontanous Breathing and Patient connected to nasal cannula oxygen  Post-op Assessment: Report given to PACU RN and Post -op Vital signs reviewed and stable  Post vital signs: Reviewed and stable  Complications: No apparent anesthesia complications

## 2012-05-26 NOTE — Anesthesia Procedure Notes (Signed)
Spinal  Patient location during procedure: OR Start time: 05/26/2012 2:00 PM End time: 05/26/2012 2:12 PM Staffing Anesthesiologist: Ester Rink TERRILL Performed by: anesthesiologist  Preanesthetic Checklist Completed: patient identified, site marked, surgical consent, pre-op evaluation, timeout performed, IV checked, risks and benefits discussed and monitors and equipment checked Spinal Block Patient position: sitting Prep: ChloraPrep Patient monitoring: heart rate, cardiac monitor, continuous pulse ox and blood pressure Approach: right paramedian Location: L3-4 Injection technique: single-shot Needle Needle type: Tuohy  Needle gauge: 22 G Needle length: 5 cm Needle insertion depth: 4 cm Assessment Sensory level: T8 Additional Notes Tolerated well

## 2012-05-26 NOTE — Anesthesia Postprocedure Evaluation (Signed)
Anesthesia Post Note  Patient: David Hubbard  Procedure(s) Performed: Procedure(s) (LRB): TOTAL KNEE ARTHROPLASTY- right (Right)  Anesthesia type: MAC/SAB  Patient location: PACU  Post pain: Pain level controlled  Post assessment: Patient's Cardiovascular Status Stable  Last Vitals:  Filed Vitals:   05/26/12 1735  BP:   Pulse:   Temp: 36.6 C  Resp:     Post vital signs: Reviewed and stable  Level of consciousness: sedated  Complications: No apparent anesthesia complications

## 2012-05-26 NOTE — Progress Notes (Signed)
Orthopedic Tech Progress Note Patient Details:  ELIA KEENUM Mar 24, 1940 [id number]  CPM Right Knee CPM Right Knee: On Right Knee Flexion (Degrees): 60 Right Knee Extension (Degrees): 0 Additional Comments: ohf will be provided when one becomes available;rn notified Pt is at 30 degrees of flexion to be comfortable;rn notified  Okey Dupre, Juron Vorhees 05/26/2012, 9:00 PM

## 2012-05-26 NOTE — Op Note (Signed)
DATE OF SURGERY:  05/26/2012 TIME: 4:57 PM  PATIENT NAME:  David Hubbard   AGE: 72 y.o.    PRE-OPERATIVE DIAGNOSIS:  DJD RIGHT KNEE  POST-OPERATIVE DIAGNOSIS:  Same  PROCEDURE:  Procedure(s): TOTAL KNEE ARTHROPLASTY- right   SURGEON:  Eulas Post, MD   ASSISTANT:  Janace Litten, OPA-C, present and scrubbed throughout the case, critical for assistance with exposure, retraction, instrumentation, and closure.   OPERATIVE IMPLANTS: Depuy PFC Sigma, rotating platform mobile bearing tibia.  Femur size 6, Tibia size 6, Patella size 41 mm 3-peg oval button, with a 10 mm polyethylene insert.   PREOPERATIVE INDICATIONS:  David Hubbard is a 72 y.o. year old male with end stage bone on bone degenerative arthritis of the knee who failed conservative treatment, including injections, antiinflammatories, activity modification, and assistive devices, and had significant impairment of their activities of daily living, and elected for Total Knee Arthroplasty.   The risks, benefits, and alternatives were discussed at length including but not limited to the risks of infection, bleeding, nerve injury, stiffness, blood clots, the need for revision surgery, cardiopulmonary complications, among others, and they were willing to proceed.   OPERATIVE DESCRIPTION:  The patient was brought to the operative room and placed in a supine position.  General anesthesia was administered.  IV antibiotics were given.  The lower extremity was prepped and draped in the usual sterile fashion.  Time out was performed.  The leg was elevated and exsanguinated and the tourniquet was inflated.  Anterior quadriceps tendon splitting approach was performed.  The patella was everted and osteophytes were removed.  The anterior horn of the medial and lateral meniscus was removed.   The distal femur was opened with the drill and the intramedullary distal femoral cutting jig was utilized, set at 5 degrees resecting 10 mm off the  distal femur.  Care was taken to protect the collateral ligaments.  Then the extramedullary tibial cutting jig was utilized making the appropriate cut using the anterior tibial crest as a reference building in appropriate posterior slope.  Care was taken during the cut to protect the medial and collateral ligaments.  The proximal tibia was removed along with the posterior horns of the menisci.  The PCL was sacrificed.    The extensor gap was measured and was approximately 10mm.    The distal femoral sizing jig was applied, taking care to avoid notching.  Then the 4-in-1 cutting jig was applied and the anterior and posterior femur was cut, along with the chamfer cuts.  All posterior osteophytes were removed.  The flexion gap was then measured and was symmetric with the extension gap.  I completed the distal femoral preparation using the appropriate jig to prepare the box.  The patella was then measured, and cut with the saw.  It measured 28 mm before the cut, and 18 mm after the cut.  The proximal tibia was sized and prepared accordingly with the reamer and the punch, and then all components were trialed with the 10mm poly insert.  The 6 mm tibial tray was not available in a fixed bearing option, and a mobile bearing component was selected, comparable to his contralateral side. The femur fit perfectly distally, although proximally was slightly overhung, but the size 6 did not come in a narrow size, and the size 5 femur was too small distally. The knee was found to have excellent balance and full motion.    The above named components were then cemented into place and all  excess cement was removed.    The knee was easily taken through a range of motion and the patella tracked well and the knee irrigated copiously and the parapatellar and subcutaneous tissue closed with vicryl, and monocryl with steri strips for the skin.  The wounds were injected with marcaine, and dressed with sterile gauze and the  tourniquet released and the patient was awakened and returned to the PACU in stable and satisfactory condition.  There were no complications.  Tourniquet time was approximately 2 hours.

## 2012-05-26 NOTE — H&P (Signed)
PREOPERATIVE H&P  Chief Complaint: DJD RIGHT KNEE  HPI: David Hubbard is a 72 y.o. male who presents for preoperative history and physical with a diagnosis of DJD RIGHT KNEE. Symptoms are rated as moderate to severe, and have been worsening.  This is significantly impairing activities of daily living.  He has elected for surgical management. He has had injections, activity modification, anti-inflammatories, although he could not take them due to history of cardiac disease, and continues to have severe symptoms.  Past Medical History  Diagnosis Date  . Myocardial infarction   . Hypertension   . Sleep apnea   . Skin cancer   . Coronary artery disease   . Hx of kidney disease     kidney stones "blasted"  . 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 Coronary artery bypass graft    . Joint replacement      left knee  . Joint replacement      left shoulder   History   Social History  . Marital Status: Divorced    Spouse Name: N/A    Number of Children: N/A  . Years of Education: N/A   Social History Main Topics  . Smoking status: Not on file  . Smokeless tobacco: Not on file  . Alcohol Use: Not on file  . Drug Use: Not on file  . Sexually Active: Not on file   Other Topics Concern  . Not on file   Social History Narrative  . No narrative on file   No family history on file. Allergies  Allergen Reactions  . Contrast Media (Iodinated Diagnostic Agents) Nausea And Vomiting   Prior to Admission medications   Medication Sig Start Date End Date Taking? Authorizing Provider  Ascorbic Acid (VITAMIN C CR) 1000 MG TBCR Take 1 tablet by mouth every morning.    Yes Historical Provider, MD  aspirin ([employer] ASPIRIN) 81 MG EC tablet Take 81 mg by mouth daily. Swallow whole.   Yes Historical Provider, MD  B Complex Vitamins (VITAMIN B COMPLEX PO) Take 1 tablet by mouth every morning.     Yes Historical Provider, MD  Cholecalciferol (VITAMIN D-3) 5000 UNITS TABS Take 1 tablet by mouth daily.    Yes Historical Provider, MD  Coenzyme Q10 (CO Q10) 100 MG TABS Take 1 capsule by mouth every morning.    Yes Historical Provider, MD  Colesevelam HCl [facility]) 3.75 G PACK Take 1 packet by mouth daily.   Yes Historical Provider, MD  Cranberry 250 MG TABS Take 1 tablet by mouth every morning.    Yes Historical Provider, MD  diclofenac (VOLTAREN) 75 MG EC tablet Take 75 mg by mouth daily.  04/16/11  Yes Historical Provider, MD  DIOVAN 160 MG tablet Take 160 mg by mouth daily.  04/26/11  Yes Historical Provider, MD  diphenhydrAMINE (BENADRYL) 25 mg capsule Take 25 mg by mouth at bedtime as needed for allergies.   Yes Historical Provider, MD  Grape Seed (RA GRAPE SEED) 100 MG CAPS Take 1 capsule by mouth every morning.    Yes Historical Provider, MD  loratadine (CLARITIN) 10 MG tablet Take 10 mg by mouth daily.   Yes Historical Provider, MD  metoprolol tartrate (LOPRESSOR) 25 MG tablet Take 25 mg by mouth 2 (two) times daily.  04/20/11  Yes Historical Provider, MD  Misc Natural Products (PROSTATE HEALTH) CAPS Take 1 capsule  by mouth every morning.    Yes Historical Provider, MD  Multiple Vitamins-Minerals (MENS MULTI VITAMIN & MINERAL PO) Take 1 tablet by mouth every morning.    Yes Historical Provider, MD  Omega-3 Fatty Acids (FISH OIL BURP-LESS) 1200 MG CAPS Take 2 capsules by mouth 2 (two) times daily.    Yes Historical Provider, MD  Resveratrol 250 MG CAPS Take 1 capsule by mouth daily.   Yes Historical Provider, MD  traMADol (ULTRAM) 50 MG tablet 50 mg every 8 (eight) hours as needed for pain.  09/13/11  Yes Historical Provider, MD     Positive ROS: All other systems have been reviewed and were otherwise negative with the exception of those mentioned in the HPI and as above.  Physical Exam: General: Alert, no acute distress Cardiovascular: No pedal edema Respiratory: No cyanosis, no use  of accessory musculature GI: No organomegaly, abdomen is soft and non-tender Skin: No lesions in the area of chief complaint Neurologic: Sensation intact distally Psychiatric: Patient is competent for consent with normal mood and affect Lymphatic: No axillary or cervical lymphadenopathy  MUSCULOSKELETAL: Right knee has range of motion from 5 to 120. He is positive crepitance, pain diffusely.  X-rays demonstrate advanced osteoarthritis of the knee with varus alignment and periarticular osteophyte formation and loss of joint space.  Assessment: DJD RIGHT KNEE  Plan: Plan for Procedure(s): TOTAL KNEE ARTHROPLASTY  The risks benefits and alternatives were discussed with the patient including but not limited to the risks of nonoperative treatment, versus surgical intervention including infection, bleeding, nerve injury,  blood clots, cardiopulmonary complications, morbidity, mortality, among others, and they were willing to proceed.   Eulas Post, MD Cell [phone]   05/26/2012 12:57 PM

## 2012-05-26 NOTE — Preoperative (Addendum)
Beta Blockers   Reason not to administer Beta Blockers:  Pt took Lopressor this morning.  KBJames, CRNA, 1500, 04/25/2012

## 2012-05-26 NOTE — Anesthesia Preprocedure Evaluation (Addendum)
Anesthesia Evaluation  Patient identified by MRN, date of birth, ID band Patient awake    Reviewed: Allergy & Precautions, H&P , Patient's Chart, lab work & pertinent test results  History of Anesthesia Complications Negative for: history of anesthetic complications  Airway Mallampati: III  Neck ROM: Full    Dental  (+) Teeth Intact   Pulmonary sleep apnea ,  breath sounds clear to auscultation        Cardiovascular hypertension, + CAD and + Past MI Rhythm:Regular Rate:Normal     Neuro/Psych negative neurological ROS     GI/Hepatic negative GI ROS, Neg liver ROS,   Endo/Other  Morbid obesity  Renal/GU negative Renal ROS     Musculoskeletal   Abdominal (+) + obese,   Peds  Hematology negative hematology ROS (+)   Anesthesia Other Findings Full beard  Reproductive/Obstetrics                         Anesthesia Physical Anesthesia Plan  ASA: III  Anesthesia Plan: Spinal   Post-op Pain Management:    Induction: Intravenous  Airway Management Planned: Natural Airway  Additional Equipment:   Intra-op Plan:   Post-operative Plan:   Informed Consent: I have reviewed the patients History and Physical, chart, labs and discussed the procedure including the risks, benefits and alternatives for the proposed anesthesia with the patient or authorized representative who has indicated his/her understanding and acceptance.     Plan Discussed with: CRNA and Surgeon  Anesthesia Plan Comments: (Plan SAB)        Anesthesia Quick Evaluation

## 2012-05-27 ENCOUNTER — Encounter (HOSPITAL_COMMUNITY): Payer: Self-pay | Admitting: Orthopedic Surgery

## 2012-05-27 LAB — CBC
HCT: 35.3 % — ABNORMAL LOW (ref 39.0–52.0)
Hemoglobin: 12 g/dL — ABNORMAL LOW (ref 13.0–17.0)
RBC: 3.82 MIL/uL — ABNORMAL LOW (ref 4.22–5.81)
WBC: 12.1 10*3/uL — ABNORMAL HIGH (ref 4.0–10.5)

## 2012-05-27 LAB — BASIC METABOLIC PANEL
BUN: 19 mg/dL (ref 6–23)
Chloride: 106 mEq/L (ref 96–112)
GFR calc Af Amer: 79 mL/min — ABNORMAL LOW (ref >90)
Glucose, Bld: 114 mg/dL — ABNORMAL HIGH (ref 70–99)
Potassium: 4.2 mEq/L (ref 3.5–5.1)
Sodium: 148 mEq/L — ABNORMAL HIGH (ref 135–145)

## 2012-05-27 MED ORDER — COLESEVELAM HCL 3.75 G PO PACK
3.8000 g | PACK | Freq: Every day | ORAL | Status: DC
  Administered 2012-05-27: 3.8 g via ORAL
  Administered 2012-05-28: 17:00:00 via ORAL
  Filled 2012-05-27 (×4): qty 1

## 2012-05-27 NOTE — Evaluation (Signed)
Physical Therapy Evaluation Patient Details Name: David Hubbard MRN: [id number] DOB: 1940-05-04 Today's Date: 05/27/2012 Time: 1212-1230 PT Time Calculation (min): 18 min  PT Assessment / Plan / Recommendation Clinical Impression  Pt is 72 y/o male admitted for s/p right TKA.  Limited evaluation due to dizziness and nausea.  Pt will benefit from acute PT services to improve overall mobility and prepare for safe d/c home    PT Assessment  Patient needs continued PT services    Follow Up Recommendations  Home health PT;Supervision/Assistance - 24 hour    Barriers to Discharge None      Equipment Recommendations  None recommended by PT    Recommendations for Other Services     Frequency 7X/week    Precautions / Restrictions Precautions Precautions: Knee Required Braces or Orthoses: Knee Immobilizer - Right Knee Immobilizer - Right: Discontinue once straight leg raise with < 10 degree lag Restrictions Weight Bearing Restrictions: Yes RLE Weight Bearing: Weight bearing as tolerated   Pertinent Vitals/Pain 6/10 right knee pain      Mobility  Bed Mobility Bed Mobility: Supine to Sit;Sitting - Scoot to Edge of Bed Supine to Sit: 4: Min assist;With rails;HOB elevated Sitting - Scoot to [place] of Bed: 4: Min assist;With rail Details for Bed Mobility Assistance: (A) to elevate trunk OOB with cues for hand placement and technique Transfers Transfers: Sit to Stand;Stand to Sit Sit to Stand: 3: Mod assist;From bed Stand to Sit: 4: Min assist;To chair/3-in-1 Details for Transfer Assistance: (A) to initiate transfer with cues for hand placement and proper technique Ambulation/Gait Ambulation/Gait Assistance: 4: Min assist Ambulation Distance (Feet): 5 Feet Assistive device: Rolling walker Ambulation/Gait Assistance Details: (A) to manage RW with max cues for step sequence with heavy use of RW Gait Pattern: Step-to pattern;Decreased stride length;Shuffle;Antalgic Gait velocity:  decreased Stairs: No Wheelchair Mobility Wheelchair Mobility: No    Exercises     PT Diagnosis: Difficulty walking;Generalized weakness;Acute pain  PT Problem List: Decreased strength;Decreased balance;Decreased mobility;Decreased knowledge of use of DME;Pain;Decreased activity tolerance;Decreased range of motion PT Treatment Interventions: DME instruction;Gait training;Stair training;Functional mobility training;Therapeutic activities;Therapeutic exercise;Balance training;Patient/family education   PT Goals Acute Rehab PT Goals PT Goal Formulation: With patient Time For Goal Achievement: 06/03/12 Potential to Achieve Goals: Good Pt will go Supine/Side to Sit: with modified independence PT Goal: Supine/Side to Sit - Progress: Goal set today Pt will go Sit to Supine/Side: with modified independence PT Goal: Sit to Supine/Side - Progress: Goal set today Pt will go Sit to Stand: with modified independence PT Goal: Sit to Stand - Progress: Goal set today Pt will go Stand to Sit: with modified independence PT Goal: Stand to Sit - Progress: Goal set today Pt will Ambulate: >150 feet;with modified independence;with rolling walker PT Goal: Ambulate - Progress: Goal set today Pt will Go Up / Down Stairs: 1-2 stairs;with modified independence;with rolling walker PT Goal: Up/Down Stairs - Progress: Goal set today Pt will Perform Home Exercise Program: Independently PT Goal: Perform Home Exercise Program - Progress: Goal set today  Visit Information  Last PT Received On: 05/27/12 Assistance Needed: +2 (for safety)    Subjective Data  Subjective: "I'm doing ok a little nauseated." Patient Stated Goal: To go home   Prior Functioning  Home Living Lives With: Spouse Available Help at Discharge: Family Type of Home: House Home Access: Stairs to enter [employer] of Steps: 1 Entrance Stairs-Rails: None Home Layout: Two level;Able to live on main level with  bedroom/bathroom Bathroom Shower/Tub:  Walk-in shower Bathroom Toilet: Standard Bathroom Accessibility: Yes How Accessible: Accessible via walker Home Adaptive Equipment: Walker - rolling Prior Function Level of Independence: Independent Able to Take Stairs?: Yes Driving: Yes Vocation: Retired Musician: No difficulties Dominant Hand: Right    Cognition  Cognition Arousal/Alertness: Awake/alert Behavior During Therapy: WFL for tasks assessed/performed Overall Cognitive Status: Within Functional Limits for tasks assessed    Extremity/Trunk Assessment Left Lower Extremity Assessment LLE ROM/Strength/Tone: Deficits;Unable to fully assess;Due to pain LLE ROM/Strength/Tone Deficits: At least 2+/5 gross with functional mobility   Balance    End of Session PT - End of Session Equipment Utilized During Treatment: Gait belt;Right knee immobilizer Activity Tolerance:  (Limited by nausea and dizziness) Patient left: in chair;with call bell/phone within reach Nurse Communication: Mobility status  GP     Karrina Lye 05/27/2012, 1:27 PM Jake Shark, PT DPT [phone]

## 2012-05-27 NOTE — Progress Notes (Signed)
Physical Therapy Progress Note  05/27/12 1700  PT Visit Information  Last PT Received On 05/27/12  Assistance Needed +1  PT Time Calculation  PT Start Time 1647  PT Stop Time 1710  PT Time Calculation (min) 23 min  Subjective Data  Subjective "I need to go to bathroom."  Patient Stated Goal To go home  Precautions  Precautions Knee  Required Braces or Orthoses Knee Immobilizer - Right  Knee Immobilizer - Right Discontinue once straight leg raise with < 10 degree lag  Restrictions  Weight Bearing Restrictions Yes  RLE Weight Bearing WBAT  Cognition  Arousal/Alertness Awake/alert  Behavior During Therapy WFL for tasks assessed/performed  Overall Cognitive Status Within Functional Limits for tasks assessed  Bed Mobility  Bed Mobility Not assessed  Transfers  Transfers Sit to Stand;Stand to Sit  Sit to Stand 4: Min assist;From chair/3-in-1  Stand to Sit 4: Min assist;To chair/3-in-1  Details for Transfer Assistance (A) to initiate transfer and slowly descend to 3n1.   Ambulation/Gait  Ambulation/Gait Assistance 4: Min guard  Ambulation Distance (Feet) 15 Feet  Assistive device Rolling walker  Ambulation/Gait Assistance Details Minguard for safety with max cues for step sequence.  Gait Pattern Step-to pattern;Decreased stride length;Shuffle;Antalgic  Gait velocity decreased  Stairs No  Wheelchair Mobility  Wheelchair Mobility No  Exercises  Exercises Total Joint  Total Joint Exercises  Ankle Circles/Pumps PROM;Both;10 reps  [employer];Seated  Heel Slides Strengthening;Right;10 reps  Goniometric ROM 10-55 degrees of flexion  PT - End of Session  Equipment Utilized During Treatment Gait belt;Right knee immobilizer  Activity Tolerance Patient tolerated treatment well  Patient left with call bell/phone within reach (on commode and RN aware)  Nurse Communication Mobility status  PT - Assessment/Plan  Comments on Treatment Session PT entered room to  educate on HEP however pt needed to go to restroom.  Pt able to perform part of HEP and needed (A) to restroom. Pt continues to make slow progress towards goals.  PT Plan Discharge plan remains appropriate;Frequency remains appropriate  PT Frequency 7X/week  Follow Up Recommendations Home health PT;Supervision/Assistance - 24 hour  PT equipment None recommended by PT  Acute Rehab PT Goals  PT Goal Formulation With patient  Time For Goal Achievement 06/03/12  Potential to Achieve Goals Good  Pt will go Sit to Stand with modified independence  PT Goal: Sit to Stand - Progress Progressing toward goal  Pt will go Stand to Sit with modified independence  PT Goal: Stand to Sit - Progress Progressing toward goal  Pt will Ambulate >150 feet;with modified independence;with rolling walker  PT Goal: Ambulate - Progress Progressing toward goal  PT General Charges  $$ ACUTE PT VISIT 1 Procedure  PT Treatments  $Therapeutic Exercise 8-22 mins  $Therapeutic Activity 8-22 mins   4/10 right knee pain  Gratis, PT DPT [phone]

## 2012-05-27 NOTE — Progress Notes (Signed)
Physical Therapy Treatment Patient Details Name: David Hubbard MRN: [id number] DOB: 04-27-1940 Today's Date: 05/27/2012 Time: 4098-1191 PT Time Calculation (min): 15 min  PT Assessment / Plan / Recommendation Comments on Treatment Session  Pt able to increase ambulation distance and improve overall mobility.  Pt continues to have difficulty with sit <> stand.  Will continue to follow.    Follow Up Recommendations  Home health PT;Supervision/Assistance - 24 hour     Barriers to Discharge None      Equipment Recommendations  None recommended by PT    Frequency 7X/week   Plan Discharge plan remains appropriate;Frequency needs to be updated    Precautions / Restrictions Precautions Precautions: Knee Required Braces or Orthoses: Knee Immobilizer - Right Knee Immobilizer - Right: Discontinue once straight leg raise with < 10 degree lag Restrictions Weight Bearing Restrictions: Yes RLE Weight Bearing: Weight bearing as tolerated   Pertinent Vitals/Pain 4/10 right knee pain; premedicated    Mobility  Bed Mobility Bed Mobility: Not assessed Supine to Sit: 4: Min assist;With rails;HOB elevated Sitting - Scoot to Edge of Bed: 4: Min assist;With rail Details for Bed Mobility Assistance: (A) to elevate trunk OOB with cues for hand placement and technique Transfers Transfers: Sit to Stand;Stand to Sit Sit to Stand: 1: +2 Total assist;From chair/3-in-1 Sit to Stand: Patient Percentage: 60% Stand to Sit: 4: Min assist Details for Transfer Assistance: (A) to initiate transfer with cues for hand placement and LE placement. Pt having difficulty with transition with UE coming from recliner to RW and maintaining balance Ambulation/Gait Ambulation/Gait Assistance: 4: Min assist Ambulation Distance (Feet): 40 Feet Assistive device: Rolling walker Ambulation/Gait Assistance Details: (A) to manage RW and chair to follow for safety.  Pt with max cues for step sequence. Gait Pattern: Step-to  pattern;Decreased stride length;Shuffle;Antalgic Gait velocity: decreased Stairs: No Wheelchair Mobility Wheelchair Mobility: No    Exercises     PT Diagnosis: Difficulty walking;Generalized weakness;Acute pain  PT Problem List: Decreased strength;Decreased balance;Decreased mobility;Decreased knowledge of use of DME;Pain;Decreased activity tolerance;Decreased range of motion PT Treatment Interventions: DME instruction;Gait training;Stair training;Functional mobility training;Therapeutic activities;Therapeutic exercise;Balance training;Patient/family education   PT Goals Acute Rehab PT Goals PT Goal Formulation: With patient Time For Goal Achievement: 06/03/12 Potential to Achieve Goals: Good Pt will go Supine/Side to Sit: with modified independence PT Goal: Supine/Side to Sit - Progress: Goal set today Pt will go Sit to Supine/Side: with modified independence PT Goal: Sit to Supine/Side - Progress: Goal set today Pt will go Sit to Stand: with modified independence PT Goal: Sit to Stand - Progress: Progressing toward goal Pt will go Stand to Sit: with modified independence PT Goal: Stand to Sit - Progress: Progressing toward goal Pt will Ambulate: >150 feet;with modified independence;with rolling walker PT Goal: Ambulate - Progress: Progressing toward goal Pt will Go Up / Down Stairs: 1-2 stairs;with modified independence;with rolling walker PT Goal: Up/Down Stairs - Progress: Goal set today Pt will Perform Home Exercise Program: Independently PT Goal: Perform Home Exercise Program - Progress: Goal set today  Visit Information  Last PT Received On: 05/27/12 Assistance Needed: +2 (for transfers)    Subjective Data  Subjective: "I don't think I can walk any further." Patient Stated Goal: To go home   Cognition  Cognition Arousal/Alertness: Awake/alert Behavior During Therapy: WFL for tasks assessed/performed Overall Cognitive Status: Within Functional Limits for tasks  assessed    Balance     End of Session PT - End of Session Equipment Utilized During Treatment:  Gait belt;Right knee immobilizer Activity Tolerance: Patient tolerated treatment well Patient left: in chair;with call bell/phone within reach Nurse Communication: Mobility status   GP     Takao Lizer 05/27/2012, 3:03 PM Jake Shark, PT DPT [phone]

## 2012-05-27 NOTE — Progress Notes (Signed)
Patient ID: David Hubbard, male   DOB: 1940/04/13, 72 y.o.   MRN: [id number]     Subjective:  Patient reports pain as mild.  Patient sitting up in bed eating.  He denies any CP or SOB  Objective:   VITALS:   Filed Vitals:   05/26/12 2238 05/26/12 2340 05/27/12 0154 05/27/12 0604  BP: 160/64 156/67 136/66 110/63  Pulse: 66 77 76 56  Temp: 98.2 F (36.8 C) 98.9 F (37.2 C) 100.5 F (38.1 C) 99.6 F (37.6 C)  TempSrc:      Resp: 16 16 16 16   Height:      Weight:      SpO2: 100% 98% 94% 97%    ABD soft Sensation intact distally Dorsiflexion/Plantar flexion intact Incision: dressing C/D/I and no drainage   Lab Results  Component Value Date   WBC 12.1* 05/27/2012   HGB 12.0* 05/27/2012   HCT 35.3* 05/27/2012   MCV 92.4 05/27/2012   PLT 124* 05/27/2012     Assessment/Plan: 1 Day Post-Op   Principal Problem:   Osteoarthritis of right knee   Advance diet Up with therapy Continue SCD's while in bed Okay to be out of immobilizer.   Haskel Khan 05/27/2012, 7:46 AM   Teryl Lucy, MD Cell [phone]

## 2012-05-27 NOTE — Progress Notes (Signed)
UR COMPLETED  

## 2012-05-27 NOTE — Care Management Note (Signed)
CARE MANAGEMENT NOTE 05/27/2012  Patient:  David Hubbard, David Hubbard   Account Number:  [id number]  Date Initiated:  05/27/2012  Documentation initiated by:  Vance Peper  Subjective/Objective Assessment:   72 yr old male s/p right total knee arthroplasty.     Action/Plan:   CM spoke with patient and wife concerning home health and DME needs at discharge. Choice offered. Patient has rolling walker, 3in1. CPM has been delivered.CM called referral to [facility].   Anticipated DC Date:  05/28/2012   Anticipated DC Plan:  HOME W HOME HEALTH SERVICES      DC Planning Services  CM consult      [facility] Choice  HOME HEALTH   Choice offered to / List presented to:  C-1 Patient        HH arranged  HH-2 PT      [facility] agency  [facility]   Status of service:  Completed, signed off Medicare Important Message given?   (If response is "NO", the following Medicare IM given date fields will be blank) Date Medicare IM given:   Date Additional Medicare IM given:    Discharge Disposition:  HOME W HOME HEALTH SERVICES  Per UR Regulation:    If discussed at Long Length of Stay Meetings, dates discussed:    Comments:

## 2012-05-28 LAB — CBC
HCT: 34.2 % — ABNORMAL LOW (ref 39.0–52.0)
Hemoglobin: 11.7 g/dL — ABNORMAL LOW (ref 13.0–17.0)
MCV: 91.4 fL (ref 78.0–100.0)
RDW: 12.5 % (ref 11.5–15.5)
WBC: 11.1 10*3/uL — ABNORMAL HIGH (ref 4.0–10.5)

## 2012-05-28 NOTE — Evaluation (Addendum)
Occupational Therapy Evaluation Patient Details Name: David Hubbard MRN: [id number] DOB: 11/04/1940 Today's Date: 05/28/2012 Time: 4401-0272 OT Time Calculation (min): 22 min  OT Assessment / Plan / Recommendation Clinical Impression    Pt is 72 y/o male admitted for s/p right TKA. Pt very lethargic during session. Pt will benefit from acute OT services to improve overall independence and prepare for safe d/c home     OT Assessment  Patient needs continued OT Services    Follow Up Recommendations  Home health OT;Supervision/Assistance - 24 hour    Barriers to Discharge      Equipment Recommendations  3 in 1 bedside comode    Recommendations for Other Services    Frequency  Min 2X/week    Precautions / Restrictions Precautions Precautions: Knee Required Braces or Orthoses: Knee Immobilizer - Right Knee Immobilizer - Right: Discontinue once straight leg raise with < 10 degree lag Restrictions Weight Bearing Restrictions: Yes RLE Weight Bearing: Weight bearing as tolerated   Pertinent Vitals/Pain Pain not rated.     ADL  Eating/Feeding: Independent Where Assessed - Eating/Feeding: Chair Grooming: Minimal assistance Where Assessed - Grooming: Supported sitting Upper Body Bathing: Minimal assistance Where Assessed - Upper Body Bathing: Supported sitting Lower Body Bathing: Maximal assistance Where Assessed - Lower Body Bathing: Supported sit to stand Upper Body Dressing: Minimal assistance Where Assessed - Upper Body Dressing: Supported sitting Lower Body Dressing: Maximal assistance Where Assessed - Lower Body Dressing: Supported sit to Pharmacist, hospital: Mining engineer Method: Sit to Barista: From Associate Professor Method: Not assessed Equipment Used: Gait belt;Knee Immobilizer;Rolling walker Transfers/Ambulation Related to ADLs: Min A for ambulation and transfers.  ADL Comments: Pt limited  today in session due to him being lethargic. Pt only able to doff Lt sock, but could not put back on. Pt at overall Max A for LB ADLs with supported sit to stand transfer and Min A for UB ADLs (? Pt very fatigued during session).    OT Diagnosis: Acute pain  OT Problem List: Decreased strength;Decreased range of motion;Decreased activity tolerance;Impaired balance (sitting and/or standing);Decreased safety awareness;Decreased knowledge of use of DME or AE;Decreased knowledge of precautions;Pain OT Treatment Interventions: Self-care/ADL training;DME and/or AE instruction;Therapeutic activities;Patient/family education;Balance training   OT Goals Acute Rehab OT Goals OT Goal Formulation: With patient Time For Goal Achievement: 06/04/12 Potential to Achieve Goals: Good ADL Goals Pt Will Perform Lower Body Bathing: with modified independence;Sit to stand from chair ADL Goal: Lower Body Bathing - Progress: Goal set today Pt Will Perform Lower Body Dressing: with modified independence;Sit to stand from bed;Sit to stand from chair ADL Goal: Lower Body Dressing - Progress: Goal set today Pt Will Transfer to Toilet: with modified independence;Ambulation;with DME ADL Goal: Toilet Transfer - Progress: Goal set today Pt Will Perform Toileting - Clothing Manipulation: with modified independence;Standing ADL Goal: Toileting - Clothing Manipulation - Progress: Goal set today Pt Will Perform Toileting - Hygiene: with modified independence;Sit to stand from 3-in-1/toilet;Sitting on 3-in-1 or toilet ADL Goal: Toileting - Hygiene - Progress: Goal set today Pt Will Perform Tub/Shower Transfer: Shower transfer;with supervision;Ambulation;with DME ADL Goal: Tub/Shower Transfer - Progress: Goal set today  Visit Information  Last OT Received On: 05/28/12 Assistance Needed: +1 PT/OT Co-Evaluation/Treatment: Yes    Subjective Data  Subjective: I want to go home.   Prior Functioning     Home Living Lives  With: Spouse Available Help at Discharge: Family Type of Home: House Home Access:  Stairs to enter [employer] of Steps: 1 Entrance Stairs-Rails: None Home Layout: Two level;Able to live on main level with bedroom/bathroom Bathroom Shower/Tub: Health visitor: Standard Bathroom Accessibility: Yes How Accessible: Accessible via walker Home Adaptive Equipment: Walker - rolling Prior Function Level of Independence: Independent Able to Take Stairs?: Yes Driving: Yes Vocation: Retired Musician: No difficulties Dominant Hand: Right         Vision/Perception     Cognition  Cognition Arousal/Alertness: Youth worker During Therapy: WFL for tasks assessed/performed Overall Cognitive Status: Within Functional Limits for tasks assessed    Extremity/Trunk Assessment Right Upper Extremity Assessment RUE ROM/Strength/Tone: [facility] for tasks assessed Left Upper Extremity Assessment LUE ROM/Strength/Tone: WFL for tasks assessed     Mobility Bed Mobility Bed Mobility: Not assessed Transfers Transfers: Sit to Stand;Stand to Sit Sit to Stand: From chair/3-in-1;With upper extremity assist;With armrests;4: Min assist Stand to Sit: 4: Min assist;With upper extremity assist;With armrests;To chair/3-in-1 Details for Transfer Assistance: (A) to initiate transfer and slowly descend to recliner. Cues for safe technique     Exercise     Balance     End of Session OT - End of Session Equipment Utilized During Treatment: Gait belt;Right knee immobilizer Activity Tolerance: Patient limited by fatigue Patient left: in chair;with call bell/phone within reach Nurse Communication: Mobility status  GO     Earlie Raveling OTR/L [phone] 05/28/2012, 4:08 PM

## 2012-05-28 NOTE — Progress Notes (Signed)
Physical Therapy Treatment Patient Details Name: David Hubbard MRN: [id number] DOB: 1940-06-19 Today's Date: 05/28/2012 Time: 7829-5621 PT Time Calculation (min): 26 min  PT Assessment / Plan / Recommendation Comments on Treatment Session  Patient still not progressing well enough to DC home today. Family and MD made aware. Will plan on DC tomorrow.     Follow Up Recommendations  Home health PT;Supervision/Assistance - 24 hour     Does the patient have the potential to tolerate intense rehabilitation     Barriers to Discharge        Equipment Recommendations  None recommended by PT    Recommendations for Other Services    Frequency 7X/week   Plan Discharge plan remains appropriate;Frequency remains appropriate    Precautions / Restrictions Precautions Precautions: Knee Required Braces or Orthoses: Knee Immobilizer - Right Knee Immobilizer - Right: Discontinue once straight leg raise with < 10 degree lag Restrictions Weight Bearing Restrictions: Yes RLE Weight Bearing: Weight bearing as tolerated   Pertinent Vitals/Pain     Mobility  Bed Mobility Bed Mobility: Not assessed Transfers Sit to Stand: From chair/3-in-1;With upper extremity assist;With armrests;4: Min assist Stand to Sit: 4: Min assist;With upper extremity assist;With armrests;To chair/3-in-1 Details for Transfer Assistance: (A) to initiate transfer and slowly descend to recliner. Cues for safe technique Ambulation/Gait Ambulation/Gait Assistance: 4: Min assist Ambulation Distance (Feet): 30 Feet Assistive device: Rolling walker Ambulation/Gait Assistance Details: One seated rest break required. Cues for posture. Patient flexed over RW Gait Pattern: Step-to pattern Gait velocity: decreased    Exercises Total Joint Exercises Quad Sets: AROM;Right;10 reps Heel Slides: Right;10 reps;AAROM Hip ABduction/ADduction: AAROM;Right;10 reps Straight Leg Raises: AAROM;Right;10 reps   PT Diagnosis:    PT Problem  List:   PT Treatment Interventions:     PT Goals Acute Rehab PT Goals PT Goal: Sit to Stand - Progress: Progressing toward goal PT Goal: Stand to Sit - Progress: Progressing toward goal PT Goal: Ambulate - Progress: Progressing toward goal PT Goal: Perform Home Exercise Program - Progress: Progressing toward goal  Visit Information  Last PT Received On: 05/28/12 Assistance Needed: +1    Subjective Data      Cognition  Cognition Arousal/Alertness: Awake/alert Behavior During Therapy: WFL for tasks assessed/performed Overall Cognitive Status: Within Functional Limits for tasks assessed    Balance     End of Session PT - End of Session Equipment Utilized During Treatment: Gait belt;Right knee immobilizer Activity Tolerance: Patient limited by fatigue;Treatment limited secondary to medication Patient left: with call bell/phone within reach;in chair Nurse Communication: Mobility status   GP     Fredrich Birks 05/28/2012, 2:47 PM 05/28/2012 Fredrich Birks PTA [phone] pager [phone] office

## 2012-05-28 NOTE — Care Management Note (Addendum)
    Page 1 of 2   05/29/2012     12:34:04 PM   CARE MANAGEMENT NOTE 05/29/2012  Patient:  David Hubbard, David Hubbard   Account Number:  [id number]  Date Initiated:  05/27/2012  Documentation initiated by:  Vance Peper  Subjective/Objective Assessment:   72 yr old male s/p right total knee arthroplasty.     Action/Plan:   CM spoke with patient and wife concerning home health and DME needs at discharge. Choice offered. Patient has rolling walker, . CPM has been delivered.CM called referral to [facility].   Anticipated DC Date:  05/22/2012   Anticipated DC Plan:  HOME W HOME HEALTH SERVICES      DC Planning Services  CM consult      [facility] Choice  HOME HEALTH   Choice offered to / List presented to:  C-1 Patient   DME arranged  WALKER - ROLLING  CPM  BEDSIDE COMMODE      DME agency  [employer]     HH arranged  HH-2 PT  HH-1 RN      [facility] agency  [facility]   Status of service:  Completed, signed off Medicare Important Message given?   (If response is "NO", the following Medicare IM given date fields will be blank) Date Medicare IM given:   Date Additional Medicare IM given:    Discharge Disposition:  HOME W HOME HEALTH SERVICES  Per UR Regulation:  Reviewed for med. necessity/level of care/duration of stay  If discussed at Long Length of Stay Meetings, dates discussed:    Comments:  05/29/12 11:52 Letha Cape RN, BSN [phone] patient is for dc to home today, RN states patient needs a w/ chair, informed her that would have to be ok 'd thru MD and patient will have to pay for it bc Medicare does not pay for [facility] to have w/chairs, Paitent finally decided that he does not want the w/chair and he states he did not have a commode at the house , called [employer] they wanted me to see if [facility] could deliver the [facility],  Called Justin with [facility], he states he can have it to pt's room within 1 hour, informed patient of this information and BRent with [employer].  05/28/12 11:46  Letha Cape RN, BSN [phone] patient is set up with [facility] for [facility] and pt,  Patient is for dc today, Corrie Dandy with [facility] notified. Patient has 3n 1, rolling walker and cpm machine at home.

## 2012-05-28 NOTE — Progress Notes (Signed)
Physical Therapy Treatment Patient Details Name: David Hubbard MRN: [id number] DOB: 20-Mar-1940 Today's Date: 05/28/2012 Time: 4098-1191 PT Time Calculation (min): 24 min  PT Assessment / Plan / Recommendation Comments on Treatment Session  Patient limited this session due to sleepiness. Patient states that he hasn't slept well and was given meds to sleep and he is still tired. Noted discharge summary for DC today. Patient is highly unsafe to DC home today if he does not make substantial progress this afternoon    Follow Up Recommendations  Home health PT;Supervision/Assistance - 24 hour     Does the patient have the potential to tolerate intense rehabilitation     Barriers to Discharge        Equipment Recommendations  None recommended by PT    Recommendations for Other Services    Frequency 7X/week   Plan Discharge plan remains appropriate;Frequency remains appropriate    Precautions / Restrictions Precautions Precautions: Knee Required Braces or Orthoses: Knee Immobilizer - Right Knee Immobilizer - Right: Discontinue once straight leg raise with < 10 degree lag Restrictions RLE Weight Bearing: Weight bearing as tolerated   Pertinent Vitals/Pain 7/10 R KNee pain. PRemedicated    Mobility  Bed Mobility Bed Mobility: Not assessed Transfers Sit to Stand: 3: Mod assist;From chair/3-in-1;With upper extremity assist;With armrests Stand to Sit: 4: Min assist;With upper extremity assist;With armrests;To chair/3-in-1 Details for Transfer Assistance: (A) to initiate transfer and slowly descend to recliner. Cues for safe technique Ambulation/Gait Ambulation/Gait Assistance: 4: Min assist Ambulation Distance (Feet): 10 Feet Assistive device: Rolling walker Ambulation/Gait Assistance Details: Min A for balance and safety. Patient having difficulty bearing weight through R LE Gait Pattern: Step-to pattern;Decreased stride length;Shuffle;Antalgic Gait velocity: decreased     Exercises Total Joint Exercises Quad Sets: AROM;Right;10 reps Heel Slides: Right;10 reps;AAROM Hip ABduction/ADduction: AAROM;Right;10 reps Straight Leg Raises: AAROM;Right;10 reps   PT Diagnosis:    PT Problem List:   PT Treatment Interventions:     PT Goals Acute Rehab PT Goals PT Goal: Sit to Stand - Progress: Not progressing PT Goal: Stand to Sit - Progress: Not progressing PT Goal: Ambulate - Progress: Not progressing PT Goal: Perform Home Exercise Program - Progress: Progressing toward goal  Visit Information  Last PT Received On: 05/28/12 Assistance Needed: +1    Subjective Data      Cognition  Cognition Arousal/Alertness: Awake/alert Behavior During Therapy: WFL for tasks assessed/performed Overall Cognitive Status: Within Functional Limits for tasks assessed    Balance     End of Session PT - End of Session Equipment Utilized During Treatment: Gait belt;Right knee immobilizer Activity Tolerance: Patient limited by fatigue;Treatment limited secondary to medication (patient states he was given sleeping meds ) Patient left: with call bell/phone within reach;in chair Nurse Communication: Mobility status   GP     Fredrich Birks 05/28/2012, 11:23 AM 05/28/2012 Fredrich Birks PTA [phone] pager [phone] office

## 2012-05-28 NOTE — Discharge Summary (Addendum)
Physician Discharge Summary  Patient ID: David Hubbard MRN: [id number] DOB/AGE: 1940/08/24 72 y.o.  Admit date: 05/26/2012 Discharge date: 05/29/2012  Admission Diagnoses:  Osteoarthritis of right knee  Discharge Diagnoses:  Principal Problem:   Osteoarthritis of right knee   Past Medical History  Diagnosis Date  . Hypertension   . Sleep apnea   . Coronary artery disease   . Kidney stones   . High cholesterol   . Myocardial infarction 2001  . Anginal pain   . Osteoarthritis of right knee 05/26/2012  . Chronic lower back pain   . Anxiety   . Depression   . Skin cancer     "arms & legs" (05/27/2012)  . Non-Hodgkin lymphoma     followed by Dr. Myna Hidalgo for proable low grade non-Hodgkin lymphoma    Surgeries: Procedure(s): TOTAL KNEE ARTHROPLASTY- right on 05/26/2012   Consultants (if any):    Discharged Condition: Improved  Hospital Course: David Hubbard is an 72 y.o. male who was admitted 05/26/2012 with a diagnosis of Osteoarthritis of right knee and went to the operating room on 05/26/2012 and underwent the above named procedures.  He had some difficulty with PT, and needed a wheelchair to assist his home mobility.  He also stayed an extra day to continue working with PT.    He was given perioperative antibiotics:  Anti-infectives   Start     Dose/Rate Route Frequency Ordered Stop   05/26/12 2300  ceFAZolin (ANCEF) IVPB 2 g/50 mL premix     2 g 100 mL/hr over 30 Minutes Intravenous Every 6 hours 05/26/12 2233 05/27/12 0502   05/26/12 1112  ceFAZolin (ANCEF) IVPB 2 g/50 mL premix  Status:  Discontinued     2 g 100 mL/hr over 30 Minutes Intravenous On call to O.R. 05/26/12 1113 05/26/12 1119   05/26/12 0600  ceFAZolin (ANCEF) 3 g in dextrose 5 % 50 mL IVPB  Status:  Discontinued     3 g 160 mL/hr over 30 Minutes Intravenous On call to O.R. 05/25/12 1253 05/26/12 2226    .  He was given sequential compression devices, early ambulation, and lovenox for DVT  prophylaxis.  He benefited maximally from the hospital stay and there were no complications.    Recent vital signs:  Filed Vitals:   05/28/12 0551  BP: 165/86  Pulse: 96  Temp: 101.4 F (38.6 C)  Resp: 18    Recent laboratory studies:  Lab Results  Component Value Date   HGB 11.7* 05/28/2012   HGB 12.0* 05/27/2012   HGB 15.4 05/26/2012   Lab Results  Component Value Date   WBC 11.1* 05/28/2012   PLT 113* 05/28/2012   Lab Results  Component Value Date   INR 0.96 05/06/2012   Lab Results  Component Value Date   NA 148* 05/27/2012   K 4.2 05/27/2012   CL 106 05/27/2012   CO2 28 05/27/2012   BUN 19 05/27/2012   CREATININE 1.08 05/27/2012   GLUCOSE 114* 05/27/2012    Discharge Medications:     Medication List    STOP taking these medications       diclofenac 75 MG EC tablet  Commonly known as:  VOLTAREN      TAKE these medications       Co Q10 100 MG Tabs  Take 1 capsule by mouth every morning.     Cranberry 250 MG Tabs  Take 1 tablet by mouth every morning.     [employer] ASPIRIN 81  MG EC tablet  Generic drug:  aspirin  Take 81 mg by mouth daily. Swallow whole.     DIOVAN 160 MG tablet  Generic drug:  valsartan  Take 160 mg by mouth daily.     diphenhydrAMINE 25 mg capsule  Commonly known as:  BENADRYL  Take 25 mg by mouth at bedtime as needed for allergies.     enoxaparin 30 MG/0.3ML injection  Commonly known as:  LOVENOX  Inject 0.3 mLs (30 mg total) into the skin every 12 (twelve) hours.     FISH OIL BURP-LESS 1200 MG Caps  Take 2 capsules by mouth 2 (two) times daily.     loratadine 10 MG tablet  Commonly known as:  CLARITIN  Take 10 mg by mouth daily.     MENS MULTI VITAMIN & MINERAL PO  Take 1 tablet by mouth every morning.     metoprolol tartrate 25 MG tablet  Commonly known as:  LOPRESSOR  Take 25 mg by mouth 2 (two) times daily.     oxyCODONE-acetaminophen 10-325 MG per tablet  Commonly known as:  PERCOCET  Take 1-2 tablets by mouth every 6  (six) hours as needed for pain. MAXIMUM TOTAL ACETAMINOPHEN DOSE IS 4000 MG PER DAY     PROSTATE HEALTH Caps  Take 1 capsule by mouth every morning.     RA GRAPE SEED 100 MG Caps  Generic drug:  Grape Seed  Take 1 capsule by mouth every morning.     Resveratrol 250 MG Caps  Take 1 capsule by mouth daily.     sennosides-docusate sodium 8.6-50 MG tablet  Commonly known as:  SENOKOT-S  Take 2 tablets by mouth daily.     traMADol 50 MG tablet  Commonly known as:  ULTRAM  50 mg every 8 (eight) hours as needed for pain.     VITAMIN B COMPLEX PO  Take 1 tablet by mouth every morning.     Vitamin C CR 1000 MG Tbcr  Take 1 tablet by mouth every morning.     Vitamin D-3 5000 UNITS Tabs  Take 1 tablet by mouth daily.     WELCHOL 3.75 G Pack  Generic drug:  Colesevelam HCl  Take 1 packet by mouth daily.        Diagnostic Studies: Dg Chest 2 View  05/06/2012   *RADIOLOGY REPORT*  Clinical Data: Preop for total knee arthroplasty.  Hypertension.  CHEST - 2 VIEW  Comparison: 06/15/2009  Findings: Mild hyperinflation. Prior median sternotomy.  Left glenohumeral joint arthroplasty. Midline trachea.  Borderline cardiomegaly, with atherosclerosis in the transverse aorta. No pleural effusion or pneumothorax.  No congestive failure.  Clear lungs.  IMPRESSION: Mild hyperinflation and borderline cardiomegaly. No acute findings.   Original Report Authenticated By: Jeronimo Greaves, M.D.   Dg Knee Right Port  05/26/2012   *RADIOLOGY REPORT*  Clinical Data: Right total knee replacement, follow-up  PORTABLE RIGHT KNEE - 1-2 VIEW  Comparison: None.  Findings: The femoral and tibial components of the right total knee replacement are in good position and alignment.  No fracture is seen.  Air is noted in the soft tissues and in the joint space postoperatively.  IMPRESSION: Right total knee replacement in good position.   Original Report Authenticated By: Dwyane Dee, M.D.    Disposition:       Discharge  Orders   Future Appointments Provider Department Dept Phone   10/13/2012 9:30 AM Sharlotte Alamo [facility] [phone]  10/13/2012 10:00 AM Josph Macho, MD [facility] [phone]   Future Orders Complete By Expires     Call MD / Call 911  As directed     Comments:      If you experience chest pain or shortness of breath, CALL 911 and be transported to the hospital emergency room.  If you develope a fever above 101 F, pus (white drainage) or increased drainage or redness at the wound, or calf pain, call your surgeon's office.    Change dressing  As directed     Comments:      Change dressing in three days, then change the dressing daily with sterile 4 x 4 inch gauze dressing.  You may clean the incision with alcohol prior to redressing.    Constipation Prevention  As directed     Comments:      Drink plenty of fluids.  Prune juice may be helpful.  You may use a stool softener, such as Colace (over the counter) 100 mg twice a day.  Use MiraLax (over the counter) for constipation as needed.    Diet general  As directed     Discharge instructions  As directed     Comments:      Change dressing in 3 days and reapply fresh dressing, unless you have a splint (half cast).  If you have a splint/cast, just leave in place until your follow-up appointment.    Keep wounds dry for 3 weeks.  Leave steri-strips in place on skin.  Do not apply lotion or anything to the wound.    Do not put a pillow under the knee. Place it under the heel.  As directed     TED hose  As directed     Comments:      Use stockings (TED hose) for 2 weeks on both leg(s).  You may remove them at night for sleeping.       Follow-up Information   Follow up with Eulas Post, MD In 2 weeks.   Contact information:   [address] [place] [address] [phone]        Signed: Eulas Post 05/28/2012, 8:18 AM

## 2012-05-29 LAB — CBC
HCT: 33.9 % — ABNORMAL LOW (ref 39.0–52.0)
Hemoglobin: 11.9 g/dL — ABNORMAL LOW (ref 13.0–17.0)
MCH: 31.8 pg (ref 26.0–34.0)
MCHC: 35.1 g/dL (ref 30.0–36.0)
MCV: 90.6 fL (ref 78.0–100.0)

## 2012-05-29 NOTE — Progress Notes (Signed)
Agree with updated equipment and need for w/c due to limited progression and refusing SNF at this time.  [place], [place] DPT [phone]

## 2012-05-29 NOTE — Progress Notes (Signed)
Patient ID: BARBARA KENG, male   DOB: 1940/11/13, 72 y.o.   MRN: [id number]     Subjective:  Patient reports pain as mild.  Patient states that he is doing better today  Objective:   VITALS:   Filed Vitals:   05/28/12 2130 05/28/12 2136 05/29/12 0230 05/29/12 0552  BP: 150/76   144/62  Pulse: 99   81  Temp: 102.7 F (39.3 C) 101.8 F (38.8 C) 100.1 F (37.8 C) 100.1 F (37.8 C)  TempSrc:      Resp:    18  Height:      Weight:      SpO2: 94%   93%    ABD soft Sensation intact distally Dorsiflexion/Plantar flexion intact Incision: dressing C/D/I and no drainage   Lab Results  Component Value Date   WBC 12.0* 05/29/2012   HGB 11.9* 05/29/2012   HCT 33.9* 05/29/2012   MCV 90.6 05/29/2012   PLT 114* 05/29/2012     Assessment/Plan: 3 Days Post-Op   Principal Problem:   Osteoarthritis of right knee   Advance diet Up with therapy Discharge home with home health if passes PT   Haskel Khan 05/29/2012, 8:02 AM   Teryl Lucy, MD Cell [phone]

## 2012-05-29 NOTE — Progress Notes (Signed)
Physical Therapy Treatment Patient Details Name: David Hubbard MRN: [id number] DOB: [birth year] Today's Date: 05/29/2012 Time: 0940-1009 PT Time Calculation (min): 29 min  PT Assessment / Plan / Recommendation Comments on Treatment Session  Patient still extremely limited with mobility. Recommended SNF however patient ademently refusing and stating that he is going home today. RN to page PA reguarding situation. UPdated equipement recommendation to include WC for patient safety. Patient stated that he understands that he is not moving safely but he is going home today    Follow Up Recommendations  Home health PT;Supervision/Assistance - 24 hour     Does the patient have the potential to tolerate intense rehabilitation     Barriers to Discharge        Equipment Recommendations  Wheelchair (measurements PT);Wheelchair cushion (measurements PT)    Recommendations for Other Services    Frequency 7X/week   Plan Discharge plan remains appropriate;Frequency remains appropriate    Precautions / Restrictions Precautions Precautions: Knee Required Braces or Orthoses: Knee Immobilizer - Right Knee Immobilizer - Right: Discontinue once straight leg raise with < 10 degree lag Restrictions RLE Weight Bearing: Weight bearing as tolerated   Pertinent Vitals/Pain     Mobility  Bed Mobility Supine to Sit: 4: Min assist;With rails;HOB elevated Details for Bed Mobility Assistance: (A) to elevate trunk OOB with cues for hand placement and technique Transfers Sit to Stand: From chair/3-in-1;With upper extremity assist;With armrests;3: Mod assist Stand to Sit: 4: Min assist;With upper extremity assist;With armrests;To chair/3-in-1 Details for Transfer Assistance: (A) to initiate transfer and slowly descend to recliner. Cues for safe technique Ambulation/Gait Ambulation/Gait Assistance: 4: Min assist Ambulation Distance (Feet): 10 Feet Assistive device: Rolling walker Ambulation/Gait Assistance  Details: Patient unable to progress ambulation due to fatique. Cannot step with with L foot as he has to put weight through his R foot. Patient flexed and resting on Rw Gait Pattern: Step-to pattern;Left steppage;Decreased step length - left;Decreased stance time - right Gait velocity: decreased    Exercises     PT Diagnosis:    PT Problem List:   PT Treatment Interventions:     PT Goals Acute Rehab PT Goals PT Goal: Supine/Side to Sit - Progress: Progressing toward goal PT Goal: Sit to Stand - Progress: Not progressing PT Goal: Stand to Sit - Progress: Not progressing PT Goal: Ambulate - Progress: Not progressing  Visit Information  Last PT Received On: 05/29/12 Assistance Needed: +1    Subjective Data      Cognition  Cognition Arousal/Alertness: Lethargic Behavior During Therapy: WFL for tasks assessed/performed Overall Cognitive Status: Within Functional Limits for tasks assessed    Balance     End of Session PT - End of Session Equipment Utilized During Treatment: Gait belt;Right knee immobilizer Activity Tolerance: Patient limited by fatigue Patient left: with call bell/phone within reach;in chair Nurse Communication: Mobility status   GP     Fredrich Birks 05/29/2012, 11:31 AM 05/29/2012 Fredrich Birks PTA [phone] pager [phone] office

## 2012-05-29 NOTE — Progress Notes (Signed)
OT Cancellation Note  Patient Details Name: David Hubbard MRN: [id number] DOB: [birth year]   Cancelled Treatment:     Pt refusing to work with OT prior to d/c home.   Earlie Raveling OTR/L [phone] 05/29/2012, 12:53 PM

## 2012-06-19 ENCOUNTER — Ambulatory Visit: Payer: Medicare Other | Attending: Orthopedic Surgery | Admitting: Physical Therapy

## 2012-06-19 DIAGNOSIS — R609 Edema, unspecified: Secondary | ICD-10-CM | POA: Insufficient documentation

## 2012-06-19 DIAGNOSIS — Z96659 Presence of unspecified artificial knee joint: Secondary | ICD-10-CM | POA: Insufficient documentation

## 2012-06-19 DIAGNOSIS — M12869 Other specific arthropathies, not elsewhere classified, unspecified knee: Secondary | ICD-10-CM | POA: Insufficient documentation

## 2012-06-19 DIAGNOSIS — IMO0001 Reserved for inherently not codable concepts without codable children: Secondary | ICD-10-CM | POA: Insufficient documentation

## 2012-06-19 DIAGNOSIS — M130 Polyarthritis, unspecified: Secondary | ICD-10-CM | POA: Insufficient documentation

## 2012-06-23 ENCOUNTER — Ambulatory Visit: Payer: Medicare Other | Admitting: Physical Therapy

## 2012-06-26 ENCOUNTER — Ambulatory Visit: Payer: Medicare Other | Admitting: Physical Therapy

## 2012-07-01 ENCOUNTER — Ambulatory Visit: Payer: Medicare Other | Admitting: Physical Therapy

## 2012-07-03 ENCOUNTER — Ambulatory Visit: Payer: Medicare Other | Admitting: Physical Therapy

## 2012-07-07 ENCOUNTER — Ambulatory Visit (INDEPENDENT_AMBULATORY_CARE_PROVIDER_SITE_OTHER): Payer: Medicare Other | Admitting: Podiatry

## 2012-07-07 ENCOUNTER — Encounter: Payer: Self-pay | Admitting: Podiatry

## 2012-07-07 VITALS — BP 138/73 | HR 66

## 2012-07-07 DIAGNOSIS — L03031 Cellulitis of right toe: Secondary | ICD-10-CM

## 2012-07-07 DIAGNOSIS — L03039 Cellulitis of unspecified toe: Secondary | ICD-10-CM

## 2012-07-07 DIAGNOSIS — M25571 Pain in right ankle and joints of right foot: Secondary | ICD-10-CM

## 2012-07-07 DIAGNOSIS — M25579 Pain in unspecified ankle and joints of unspecified foot: Secondary | ICD-10-CM | POA: Insufficient documentation

## 2012-07-07 NOTE — Progress Notes (Signed)
Subjective: 72 year old male walking with a cane, accompanied by wife, presents complaining of pain on right great toe.  He had history of ingrown nail on left great toe.   Objective: Red inflamed toe with mild drainage lateral border right great toe. No proximal edema or ascending cellulitis noted. DP Faintly palpable bilateral. PT not palpable both. Swollen right knee, post surgical.  Assessment: Paronychia right great toe lateral border without ascending cellulitis.   Treatment done today: Temporary Ingrown nail surgery, partial nail resection right great toe lateral border. Local used: A mixture 0.5% Marcaine plain 2ml and 1% Xylocaine with Epinephrine 2ml.

## 2012-07-08 ENCOUNTER — Ambulatory Visit: Payer: Medicare Other | Attending: Orthopedic Surgery | Admitting: Physical Therapy

## 2012-07-08 DIAGNOSIS — IMO0001 Reserved for inherently not codable concepts without codable children: Secondary | ICD-10-CM | POA: Insufficient documentation

## 2012-07-08 DIAGNOSIS — M130 Polyarthritis, unspecified: Secondary | ICD-10-CM | POA: Insufficient documentation

## 2012-07-08 DIAGNOSIS — R609 Edema, unspecified: Secondary | ICD-10-CM | POA: Insufficient documentation

## 2012-07-08 DIAGNOSIS — Z96659 Presence of unspecified artificial knee joint: Secondary | ICD-10-CM | POA: Insufficient documentation

## 2012-07-08 DIAGNOSIS — M12869 Other specific arthropathies, not elsewhere classified, unspecified knee: Secondary | ICD-10-CM | POA: Insufficient documentation

## 2012-07-09 ENCOUNTER — Ambulatory Visit: Payer: Medicare Other | Admitting: Physical Therapy

## 2012-07-10 ENCOUNTER — Ambulatory Visit: Payer: Medicare Other | Admitting: Physical Therapy

## 2012-07-14 ENCOUNTER — Ambulatory Visit (INDEPENDENT_AMBULATORY_CARE_PROVIDER_SITE_OTHER): Payer: Medicare Other | Admitting: Podiatry

## 2012-07-14 ENCOUNTER — Encounter: Payer: Self-pay | Admitting: Podiatry

## 2012-07-14 DIAGNOSIS — L03039 Cellulitis of unspecified toe: Secondary | ICD-10-CM

## 2012-07-14 DIAGNOSIS — L03031 Cellulitis of right toe: Secondary | ICD-10-CM

## 2012-07-14 NOTE — Progress Notes (Signed)
Seen for follow up on ingrown nail surgery right great toe lateral border. Objective: Inflammation has subsided. Nail border right lateral is clean and dry. No pain or drainage.  Assessment: Well healed post op nail border right great toe.  Plan: Resume palliation q 3 month.

## 2012-07-15 ENCOUNTER — Ambulatory Visit: Payer: Medicare Other | Admitting: Physical Therapy

## 2012-07-17 ENCOUNTER — Ambulatory Visit: Payer: Medicare Other | Admitting: Physical Therapy

## 2012-07-22 ENCOUNTER — Ambulatory Visit: Payer: Medicare Other | Admitting: Physical Therapy

## 2012-07-25 ENCOUNTER — Ambulatory Visit: Payer: Medicare Other | Admitting: Physical Therapy

## 2012-07-29 ENCOUNTER — Ambulatory Visit: Payer: Medicare Other | Admitting: Physical Therapy

## 2012-07-31 ENCOUNTER — Ambulatory Visit: Payer: Medicare Other | Admitting: Physical Therapy

## 2012-08-05 ENCOUNTER — Ambulatory Visit: Payer: Medicare Other | Admitting: Physical Therapy

## 2012-08-05 ENCOUNTER — Encounter: Payer: Self-pay | Admitting: Internal Medicine

## 2012-08-07 ENCOUNTER — Ambulatory Visit: Payer: Medicare Other | Admitting: Physical Therapy

## 2012-08-12 ENCOUNTER — Ambulatory Visit: Payer: Medicare Other | Admitting: Physical Therapy

## 2012-08-14 ENCOUNTER — Ambulatory Visit: Payer: Medicare Other | Admitting: Physical Therapy

## 2012-10-13 ENCOUNTER — Ambulatory Visit (HOSPITAL_BASED_OUTPATIENT_CLINIC_OR_DEPARTMENT_OTHER): Payer: Medicare Other | Admitting: Hematology & Oncology

## 2012-10-13 ENCOUNTER — Other Ambulatory Visit (HOSPITAL_BASED_OUTPATIENT_CLINIC_OR_DEPARTMENT_OTHER): Payer: Medicare Other | Admitting: Lab

## 2012-10-13 VITALS — BP 103/66 | HR 51 | Temp 97.7°F | Resp 18 | Ht 74.0 in | Wt 295.0 lb

## 2012-10-13 DIAGNOSIS — C8589 Other specified types of non-Hodgkin lymphoma, extranodal and solid organ sites: Secondary | ICD-10-CM

## 2012-10-13 DIAGNOSIS — C8590 Non-Hodgkin lymphoma, unspecified, unspecified site: Secondary | ICD-10-CM

## 2012-10-13 LAB — CHCC SATELLITE - SMEAR

## 2012-10-13 LAB — CBC WITH DIFFERENTIAL (CANCER CENTER ONLY)
BASO%: 0.2 % (ref 0.0–2.0)
EOS%: 1.9 % (ref 0.0–7.0)
HCT: 46 % (ref 38.7–49.9)
LYMPH%: 51.6 % — ABNORMAL HIGH (ref 14.0–48.0)
MCH: 31 pg (ref 28.0–33.4)
MCHC: 33.9 g/dL (ref 32.0–35.9)
MCV: 92 fL (ref 82–98)
MONO%: 8.1 % (ref 0.0–13.0)
NEUT%: 38.2 % — ABNORMAL LOW (ref 40.0–80.0)
Platelets: 126 10*3/uL — ABNORMAL LOW (ref 145–400)
RDW: 13.8 % (ref 11.1–15.7)
WBC: 9 10*3/uL (ref 4.0–10.0)

## 2012-10-13 NOTE — Progress Notes (Signed)
This office note has been dictated.

## 2012-10-14 NOTE — Progress Notes (Signed)
CC:   Eulas Post, MD [name], [place], [place] [phone] [name], MD  DIAGNOSIS:  Low-grade non-Hodgkin lymphoma, asymptomatic.  CURRENT THERAPY:  Observation.  INTERIM HISTORY:  Mr. Pilger comes in for his followup.  We last saw back in February.  He had knee surgery in May for the right knee.  He got through this without any problems.  He is not seeing the orthopedic surgeon any longer.  He has had no problems with bleeding or bruising.  He has had no fevers, sweats, or chills.  He has lost some weight, but he has wanted to.  He has noticed a rash on his upper abdomen.  He said that he has had it for about a month.  He says it is not itching.  There are no blisters associated with it.  He has not noticed it to be spreading at all. There is no bleeding from it.  He has not noted any problems with leg swelling.  Again, his right knee has done quite well with surgery.  He is not doing any kind of physical therapy.  His blood sugar has not been a problem for him.  He has had no problems with cough or shortness breath.  He does take a baby aspirin.  PHYSICAL EXAMINATION:  General:  This is a moderately obese white gentleman in no obvious distress.  Vital signs:  Temperature 97.7, pulse 51, respiratory rate 18, blood pressure 103/66.  Weight is 295.  Head and neck:  Normocephalic, atraumatic skull.  There are no ocular or oral lesions.  There are no palpable cervical or supraclavicular lymph nodes. Axillary:  No bilateral axillary adenopathy.  Lungs:  Clear bilaterally. Cardiac:  Regular rate and rhythm with a normal S1 and S2.  There are no murmurs, rubs or bruits.  Abdomen:  Soft.  He has good bowel sounds.  He is somewhat obese.  There is no fluid wave.  There is no palpable hepatosplenomegaly.  Back.  No tenderness over the spine, ribs, or hips. Extremities:  Right knee surgical scar.  He has decent range motion of the right knee.  No swelling is noted in the  legs.  He has good strength in his muscles.  Skin:  Does show this focal macular rash on the upper abdomen.  This is in the epigastric area.  It almost looks like it might be ringworm.  Neurologic:  No focal neurological deficits.  LABORATORY STUDIES:  White cell count is 9, hemoglobin 15.6, hematocrit 46, platelet count 126.  MCV is 92.  White cell differential is 38 segs, 52 lymphs, 8 monos, 2 eosinophils.  On his peripheral smear, which I reviewed, there are no immature myeloid or lymphoid forms.  I see no prolymphocytes.  He has no blasts.  There are no hypersegmented polys.  He has no nucleated red blood cells.  I see no teardrop cells.  Platelets are adequate in number and size.  IMPRESSION:  Mr. Motyka is a 72 year old gentleman.  He has a low-grade non-Hodgkin lymphoma.  This really has not been a problem for him. Again, he is totally asymptomatic with this.  Of note, he does have a flow cytometry that is positive for B cells that are CD5 and CD20 positive.  Again, since he is asymptomatic, we will just follow him along.  I do not see any need for any kind of scans.  We will plan to get him back in 8 months.    ______________________________ Josph Macho,  M.D. PRE/MEDQ  D:  10/13/2012  T:  10/14/2012  Job:  [id number]

## 2012-10-15 LAB — LACTATE DEHYDROGENASE: LDH: 123 U/L (ref 94–250)

## 2012-10-15 LAB — PROTEIN ELECTROPHORESIS, SERUM, WITH REFLEX
Beta 2: 4.1 % (ref 3.2–6.5)
Gamma Globulin: 10.5 % — ABNORMAL LOW (ref 11.1–18.8)

## 2013-06-12 ENCOUNTER — Encounter: Payer: Self-pay | Admitting: Hematology & Oncology

## 2013-06-12 ENCOUNTER — Ambulatory Visit (HOSPITAL_BASED_OUTPATIENT_CLINIC_OR_DEPARTMENT_OTHER): Payer: Medicare Other | Admitting: Hematology & Oncology

## 2013-06-12 ENCOUNTER — Other Ambulatory Visit (HOSPITAL_BASED_OUTPATIENT_CLINIC_OR_DEPARTMENT_OTHER): Payer: Medicare Other | Admitting: Lab

## 2013-06-12 VITALS — BP 111/67 | HR 58 | Temp 98.2°F | Resp 18 | Ht 72.0 in | Wt 288.0 lb

## 2013-06-12 DIAGNOSIS — C8590 Non-Hodgkin lymphoma, unspecified, unspecified site: Secondary | ICD-10-CM

## 2013-06-12 DIAGNOSIS — D72820 Lymphocytosis (symptomatic): Secondary | ICD-10-CM

## 2013-06-12 DIAGNOSIS — C8589 Other specified types of non-Hodgkin lymphoma, extranodal and solid organ sites: Secondary | ICD-10-CM

## 2013-06-12 DIAGNOSIS — D47Z9 Other specified neoplasms of uncertain behavior of lymphoid, hematopoietic and related tissue: Secondary | ICD-10-CM

## 2013-06-12 LAB — CBC WITH DIFFERENTIAL (CANCER CENTER ONLY)
BASO#: 0 10*3/uL (ref 0.0–0.2)
BASO%: 0.2 % (ref 0.0–2.0)
EOS ABS: 0.1 10*3/uL (ref 0.0–0.5)
EOS%: 1.1 % (ref 0.0–7.0)
HCT: 45.8 % (ref 38.7–49.9)
HGB: 15.6 g/dL (ref 13.0–17.1)
LYMPH#: 6.7 10*3/uL — ABNORMAL HIGH (ref 0.9–3.3)
LYMPH%: 61.2 % — AB (ref 14.0–48.0)
MCH: 32 pg (ref 28.0–33.4)
MCHC: 34.1 g/dL (ref 32.0–35.9)
MCV: 94 fL (ref 82–98)
MONO#: 0.9 10*3/uL (ref 0.1–0.9)
MONO%: 8.3 % (ref 0.0–13.0)
NEUT#: 3.2 10*3/uL (ref 1.5–6.5)
NEUT%: 29.2 % — ABNORMAL LOW (ref 40.0–80.0)
PLATELETS: 130 10*3/uL — AB (ref 145–400)
RBC: 4.88 10*6/uL (ref 4.20–5.70)
RDW: 12.8 % (ref 11.1–15.7)
WBC: 11 10*3/uL — AB (ref 4.0–10.0)

## 2013-06-12 LAB — CHCC SATELLITE - SMEAR

## 2013-06-12 LAB — LACTATE DEHYDROGENASE: LDH: 106 U/L (ref 94–250)

## 2013-06-12 NOTE — Progress Notes (Signed)
Hematology and Oncology Follow Up Visit  David Hubbard [id number] November 02, 1940 73 y.o. 06/12/2013   Principle Diagnosis:   Low-grade non-Hodgkin's lymphoma  Current Therapy:    Observation     Interim History:  Mr.  Hubbard is back for followup. Last saw him 8 months ago. He's doing okay. He has had no problems since I saw him. He's had no fever. He's had no infections. He has had no problems with bowels or bladder. He has had no rashes. He has had no cough or shortness of breath.  Medications: Current outpatient prescriptions:aspirin ([employer] ASPIRIN) 81 MG EC tablet, Take 81 mg by mouth daily. Swallow whole., Disp: , Rfl: ;  B Complex Vitamins (VITAMIN B COMPLEX PO), Take 1 tablet by mouth every morning. , Disp: , Rfl: ;  Cholecalciferol (VITAMIN D-3) 5000 UNITS TABS, Take 1 tablet by mouth daily. , Disp: , Rfl: ;  Coenzyme Q10 (CO Q10) 100 MG TABS, Take 1 capsule by mouth every morning. , Disp: , Rfl:  Colesevelam HCl (WELCHOL) 3.75 G PACK, Take 1 packet by mouth daily., Disp: , Rfl: ;  Cranberry 250 MG TABS, Take 1 tablet by mouth every morning. , Disp: , Rfl: ;  diphenhydrAMINE (BENADRYL) 25 mg capsule, Take 25 mg by mouth at bedtime as needed for allergies., Disp: , Rfl: ;  Grape Seed (RA GRAPE SEED) 100 MG CAPS, Take 1 capsule by mouth every morning. , Disp: , Rfl:  loratadine (CLARITIN) 10 MG tablet, Take 10 mg by mouth daily., Disp: , Rfl: ;  metoprolol tartrate (LOPRESSOR) 25 MG tablet, Take 25 mg by mouth 2 (two) times daily. , Disp: , Rfl: ;  Misc Natural Products (PROSTATE HEALTH) CAPS, Take 1 capsule by mouth every morning. , Disp: , Rfl: ;  Multiple Vitamins-Minerals (MENS MULTI VITAMIN & MINERAL PO), Take 1 tablet by mouth every morning. , Disp: , Rfl:  Omega-3 Fatty Acids (FISH OIL BURP-LESS) 1200 MG CAPS, Take 2 capsules by mouth 2 (two) times daily. , Disp: , Rfl: ;  Resveratrol 250 MG CAPS, Take 1 capsule by mouth daily., Disp: , Rfl: ;  traZODone (DESYREL) 50 MG tablet, Take 50 mg by  mouth at bedtime. , Disp: , Rfl: ;  Ascorbic Acid (VITAMIN C CR) 1000 MG TBCR, Take 1 tablet by mouth every morning. , Disp: , Rfl:  trospium (SANCTURA) 20 MG tablet, Take 20 mg by mouth. PT D/C THIS MED ON HIS OWN, Disp: , Rfl:   Allergies:  Allergies  Allergen Reactions  . Contrast Media [Iodinated Diagnostic Agents] Nausea And Vomiting    Past Medical History, Surgical history, Social history, and Family History were reviewed and updated.  Review of Systems: As above  Physical Exam:  height is 6' (1.829 m) and weight is 288 lb (130.636 kg). His oral temperature is 98.2 F (36.8 C). His blood pressure is 111/67 and his pulse is 58. His respiration is 18.   Well-developed and well-nourished gentleman. There is no adenopathy. Lungs are clear. Cardiac exam regular rate and rhythm. He has no murmurs. Abdomen is soft. Has good bowel sounds. There is no fluid wave. There is no palpable liver or spleen tip. Back exam shows no tenderness over the spine ribs or hips. Extremities shows no clubbing cyanosis or edema. Skin exam no rashes.  Lab Results  Component Value Date   WBC 11.0* 06/12/2013   HGB 15.6 06/12/2013   HCT 45.8 06/12/2013   MCV 94 06/12/2013   PLT 130* 06/12/2013  Chemistry      Component Value Date/Time   NA 148* 05/27/2012 0515   K 4.2 05/27/2012 0515   CL 106 05/27/2012 0515   CO2 28 05/27/2012 0515   BUN 19 05/27/2012 0515   CREATININE 1.08 05/27/2012 0515      Component Value Date/Time   CALCIUM 9.1 05/27/2012 0515   ALKPHOS 59 07/11/2011 1526   AST 24 07/11/2011 1526   ALT 38 07/11/2011 1526   BILITOT 0.4 07/11/2011 1526         Impression and Plan: David Hubbard is a 73 year old gentleman. He has a non-Hodgkin's lymphoma. This is low-grade. He's asymptomatic. His blood counts are still doing okay. He does have a lymphocytosis on his smear.  In one year, his labs have not really changed.  I think we can probably get him back in one year for followup. I told him he can always  call if he has problems in between visits.     [name], MD 6/5/20157:06 PM

## 2014-03-20 ENCOUNTER — Emergency Department (HOSPITAL_COMMUNITY)
Admission: EM | Admit: 2014-03-20 | Discharge: 2014-03-20 | Disposition: A | Discharge status: Home / Self Care | Payer: Medicare Other | Attending: Emergency Medicine | Admitting: Emergency Medicine

## 2014-03-20 ENCOUNTER — Encounter (HOSPITAL_COMMUNITY): Payer: Self-pay | Admitting: Emergency Medicine

## 2014-03-20 ENCOUNTER — Emergency Department (HOSPITAL_COMMUNITY): Payer: Medicare Other

## 2014-03-20 DIAGNOSIS — R197 Diarrhea, unspecified: Secondary | ICD-10-CM | POA: Insufficient documentation

## 2014-03-20 DIAGNOSIS — R55 Syncope and collapse: Secondary | ICD-10-CM

## 2014-03-20 DIAGNOSIS — Z9889 Other specified postprocedural states: Secondary | ICD-10-CM | POA: Diagnosis not present

## 2014-03-20 DIAGNOSIS — Z79899 Other long term (current) drug therapy: Secondary | ICD-10-CM | POA: Insufficient documentation

## 2014-03-20 DIAGNOSIS — R0602 Shortness of breath: Secondary | ICD-10-CM | POA: Diagnosis not present

## 2014-03-20 DIAGNOSIS — Y9301 Activity, walking, marching and hiking: Secondary | ICD-10-CM | POA: Insufficient documentation

## 2014-03-20 DIAGNOSIS — E780 Pure hypercholesterolemia: Secondary | ICD-10-CM | POA: Insufficient documentation

## 2014-03-20 DIAGNOSIS — Z7982 Long term (current) use of aspirin: Secondary | ICD-10-CM | POA: Insufficient documentation

## 2014-03-20 DIAGNOSIS — Z951 Presence of aortocoronary bypass graft: Secondary | ICD-10-CM | POA: Insufficient documentation

## 2014-03-20 DIAGNOSIS — I252 Old myocardial infarction: Secondary | ICD-10-CM | POA: Diagnosis not present

## 2014-03-20 DIAGNOSIS — Y9289 Other specified places as the place of occurrence of the external cause: Secondary | ICD-10-CM | POA: Diagnosis not present

## 2014-03-20 DIAGNOSIS — F329 Major depressive disorder, single episode, unspecified: Secondary | ICD-10-CM | POA: Insufficient documentation

## 2014-03-20 DIAGNOSIS — Z8572 Personal history of non-Hodgkin lymphomas: Secondary | ICD-10-CM | POA: Insufficient documentation

## 2014-03-20 DIAGNOSIS — M1990 Unspecified osteoarthritis, unspecified site: Secondary | ICD-10-CM | POA: Insufficient documentation

## 2014-03-20 DIAGNOSIS — G8929 Other chronic pain: Secondary | ICD-10-CM | POA: Diagnosis not present

## 2014-03-20 DIAGNOSIS — Z043 Encounter for examination and observation following other accident: Secondary | ICD-10-CM | POA: Diagnosis not present

## 2014-03-20 DIAGNOSIS — W1839XA Other fall on same level, initial encounter: Secondary | ICD-10-CM | POA: Insufficient documentation

## 2014-03-20 DIAGNOSIS — I10 Essential (primary) hypertension: Secondary | ICD-10-CM | POA: Insufficient documentation

## 2014-03-20 DIAGNOSIS — R0600 Dyspnea, unspecified: Secondary | ICD-10-CM | POA: Diagnosis not present

## 2014-03-20 DIAGNOSIS — I25119 Atherosclerotic heart disease of native coronary artery with unspecified angina pectoris: Secondary | ICD-10-CM | POA: Insufficient documentation

## 2014-03-20 DIAGNOSIS — Z87891 Personal history of nicotine dependence: Secondary | ICD-10-CM | POA: Diagnosis not present

## 2014-03-20 DIAGNOSIS — Z85828 Personal history of other malignant neoplasm of skin: Secondary | ICD-10-CM | POA: Insufficient documentation

## 2014-03-20 DIAGNOSIS — Z87442 Personal history of urinary calculi: Secondary | ICD-10-CM | POA: Diagnosis not present

## 2014-03-20 DIAGNOSIS — N390 Urinary tract infection, site not specified: Secondary | ICD-10-CM | POA: Diagnosis not present

## 2014-03-20 DIAGNOSIS — R42 Dizziness and giddiness: Secondary | ICD-10-CM | POA: Diagnosis present

## 2014-03-20 DIAGNOSIS — R61 Generalized hyperhidrosis: Secondary | ICD-10-CM | POA: Diagnosis not present

## 2014-03-20 DIAGNOSIS — F419 Anxiety disorder, unspecified: Secondary | ICD-10-CM | POA: Diagnosis not present

## 2014-03-20 DIAGNOSIS — Y998 Other external cause status: Secondary | ICD-10-CM | POA: Diagnosis not present

## 2014-03-20 LAB — CBC
HEMATOCRIT: 43.6 % (ref 39.0–52.0)
Hemoglobin: 14.6 g/dL (ref 13.0–17.0)
MCH: 31.5 pg (ref 26.0–34.0)
MCHC: 33.5 g/dL (ref 30.0–36.0)
MCV: 94 fL (ref 78.0–100.0)
PLATELETS: 110 10*3/uL — AB (ref 150–400)
RBC: 4.64 MIL/uL (ref 4.22–5.81)
RDW: 13.5 % (ref 11.5–15.5)
WBC: 9.5 10*3/uL (ref 4.0–10.5)

## 2014-03-20 LAB — URINALYSIS, ROUTINE W REFLEX MICROSCOPIC
BILIRUBIN URINE: NEGATIVE
Glucose, UA: NEGATIVE mg/dL
Ketones, ur: NEGATIVE mg/dL
Nitrite: POSITIVE — AB
PH: 5 (ref 5.0–8.0)
PROTEIN: NEGATIVE mg/dL
Specific Gravity, Urine: 1.018 (ref 1.005–1.030)
Urobilinogen, UA: 0.2 mg/dL (ref 0.0–1.0)

## 2014-03-20 LAB — BRAIN NATRIURETIC PEPTIDE: B Natriuretic Peptide: 50.5 pg/mL (ref 0.0–100.0)

## 2014-03-20 LAB — URINE MICROSCOPIC-ADD ON

## 2014-03-20 LAB — BASIC METABOLIC PANEL
Anion gap: 10 (ref 5–15)
BUN: 17 mg/dL (ref 6–23)
CHLORIDE: 107 mmol/L (ref 96–112)
CO2: 21 mmol/L (ref 19–32)
Calcium: 9.5 mg/dL (ref 8.4–10.5)
Creatinine, Ser: 1.19 mg/dL (ref 0.50–1.35)
GFR calc Af Amer: 69 mL/min — ABNORMAL LOW (ref >90)
GFR calc non Af Amer: 60 mL/min — ABNORMAL LOW (ref >90)
Glucose, Bld: 141 mg/dL — ABNORMAL HIGH (ref 70–99)
POTASSIUM: 4.1 mmol/L (ref 3.5–5.1)
Sodium: 138 mmol/L (ref 135–145)

## 2014-03-20 LAB — I-STAT TROPONIN, ED: Troponin i, poc: 0.02 ng/mL (ref 0.00–0.08)

## 2014-03-20 MED ORDER — CEPHALEXIN 500 MG PO CAPS: 500.0000 mg | ORAL_CAPSULE | Freq: Four times a day (QID) | ORAL | Status: DC

## 2014-03-20 MED ORDER — DEXTROSE 5 % IV SOLN
1.0000 g | Freq: Once | INTRAVENOUS | Status: AC
  Administered 2014-03-20: 1 g via INTRAVENOUS
  Filled 2014-03-20: qty 10

## 2014-03-20 NOTE — ED Provider Notes (Signed)
Date: 03/20/2014 1954  Rate: 115  Rhythm: sinus tachycardia  QRS Axis: normal  Intervals: normal  ST/T Wave abnormalities: nonspecific ST changes  Conduction Disutrbances:none  Narrative Interpretation:   Old EKG Reviewed: rate is faster on this EKG when compared to prior    Ripley Fraise, MD 03/20/14 2003

## 2014-03-20 NOTE — ED Notes (Signed)
MD at bedside. 

## 2014-03-20 NOTE — ED Notes (Addendum)
Sudden onset SHOB, hypertensive on EMS arrival 324 mg aspirin, hypotensive with 2 nitroglycerin. CP free at this time, [facility] with movement. Increased WOB. States he was sitting when symptoms began.

## 2014-03-20 NOTE — ED Provider Notes (Signed)
CSN: [id number]     Arrival date & time 03/20/14  1949 History   First MD Initiated Contact with Patient 03/20/14 2015     Chief Complaint  Patient presents with  . Shortness of Breath     Patient is a 74 y.o. male presenting with shortness of breath. The history is provided by the patient.  Shortness of Breath Severity:  Moderate Onset quality:  Sudden Timing:  Constant Progression:  Unchanged Chronicity:  New Relieved by:  Rest Worsened by:  Exertion Associated symptoms: diaphoresis   Associated symptoms: no chest pain   Associated symptoms comment:  Diaphoresis  patient presents from home for dyspnea, diaphoresis as well as near syncope Per patient/family, pt was walking to bathroom and he felt dizzy and fell - denies full LOC Family found patient appearing SOB and also diaphoretic Pt feels improved at this time No vomiting He reports mild diarrhea recently No CP No abdominal pain He denies any traumatic injury from fall  Past Medical History  Diagnosis Date  . Hypertension   . Sleep apnea   . Coronary artery disease   . Kidney stones   . High cholesterol   . Myocardial infarction 2001  . Anginal pain   . Osteoarthritis of right knee 05/26/2012  . Chronic lower back pain   . Anxiety   . Depression   . Skin cancer     "arms & legs" (05/27/2012)  . Non-Hodgkin lymphoma     followed by Dr. Marin Olp for proable low grade non-Hodgkin lymphoma   Past Surgical History  Procedure Laterality Date  . Coronary artery bypass graft  2001    CABG X3  . Total knee arthroplasty Left   . Joint replacement    . Total knee arthroplasty Right 05/26/2012    Procedure: TOTAL KNEE ARTHROPLASTY- right;  Surgeon: Johnny Bridge, MD;  Location: [facility];  Service: Orthopedics;  Laterality: Right;  . Total shoulder replacement Left 06/2009    Archie Endo 06/21/2009 (05/27/2012)  . Cardiac catheterization    . Knee arthroscopy Bilateral   . Lithotripsy    . Skin cancer excision      "arms and  legs" (05/27/2012)   No family history on file. History  Substance Use Topics  . Smoking status: Former Smoker -- 1.00 packs/day for 20 years    Types: Cigarettes    Start date: 11/13/1979    Quit date: 01/09/1999  . Smokeless tobacco: Former Systems developer    Quit date: 01/09/1999     Comment: quit smoking 14 years ago  . Alcohol Use: 1.2 oz/week    1 Glasses of wine, 1 Cans of beer per week    Review of Systems  Constitutional: Positive for diaphoresis.  Respiratory: Positive for shortness of breath.   Cardiovascular: Negative for chest pain.  Gastrointestinal: Positive for diarrhea.  Neurological: Positive for weakness.  All other systems reviewed and are negative.     Allergies  Contrast media  Home Medications   Prior to Admission medications   Medication Sig Start Date End Date Taking? Authorizing Provider  Ascorbic Acid (VITAMIN C CR) 1000 MG TBCR Take 1 tablet by mouth every morning.     Historical Provider, MD  aspirin (CVS ASPIRIN) 81 MG EC tablet Take 81 mg by mouth daily. Swallow whole.    Historical Provider, MD  B Complex Vitamins (VITAMIN B COMPLEX PO) Take 1 tablet by mouth every morning.     Historical Provider, MD  Cholecalciferol (VITAMIN D-3) 5000 UNITS  TABS Take 1 tablet by mouth daily.     Historical Provider, MD  Coenzyme Q10 (CO Q10) 100 MG TABS Take 1 capsule by mouth every morning.     Historical Provider, MD  Colesevelam HCl [facility]) 3.75 G PACK Take 1 packet by mouth daily.    Historical Provider, MD  Cranberry 250 MG TABS Take 1 tablet by mouth every morning.     Historical Provider, MD  diphenhydrAMINE (BENADRYL) 25 mg capsule Take 25 mg by mouth at bedtime as needed for allergies.    Historical Provider, MD  Grape Seed (RA GRAPE SEED) 100 MG CAPS Take 1 capsule by mouth every morning.     Historical Provider, MD  loratadine (CLARITIN) 10 MG tablet Take 10 mg by mouth daily.    Historical Provider, MD  metoprolol tartrate (LOPRESSOR) 25 MG tablet Take  25 mg by mouth 2 (two) times daily.  04/20/11   Historical Provider, MD  Misc Natural Products (PROSTATE HEALTH) CAPS Take 1 capsule by mouth every morning.     Historical Provider, MD  Multiple Vitamins-Minerals (MENS MULTI VITAMIN & MINERAL PO) Take 1 tablet by mouth every morning.     Historical Provider, MD  Omega-3 Fatty Acids (FISH OIL BURP-LESS) 1200 MG CAPS Take 2 capsules by mouth 2 (two) times daily.     Historical Provider, MD  Resveratrol 250 MG CAPS Take 1 capsule by mouth daily.    Historical Provider, MD  traZODone (DESYREL) 50 MG tablet Take 50 mg by mouth at bedtime.  07/30/12   Historical Provider, MD  trospium (SANCTURA) 20 MG tablet Take 20 mg by mouth. PT D/C THIS MED ON HIS OWN 05/19/13   Historical Provider, MD   BP 116/56 mmHg  Pulse 109  Temp(Src) 100.9 F (38.3 C) (Oral)  Resp 16  SpO2 96% Physical Exam CONSTITUTIONAL: Well developed/well nourished HEAD: Normocephalic/atraumatic EYES: EOMI/PERRL ENMT: Mucous membranes moist NECK: supple no meningeal signs SPINE/BACK:entire spine nontender CV: S1/S2 noted, no murmurs/rubs/gallops noted LUNGS: decreased breath sounds bilaterally no apparent distress ABDOMEN: soft, nontender, no rebound or guarding, bowel sounds noted throughout abdomen GU:no cva tenderness NEURO: Pt is awake/alert/appropriate, moves all extremitiesx4.  No facial droop.   EXTREMITIES: pulses normal/equal, full ROM SKIN: warm, color normal PSYCH: no abnormalities of mood noted, alert and oriented to situation  ED Course  Procedures  8:45 PM Pt with near syncope/SOB Family admits pt has frequent episodes of dyspnea on exertion but today appeared worse Pt is very evasive during exam and does not want to answer most questions Workup in process When reviewing prior EKG and today's EKGs, no acute STEMI noted (pt denies CP)   After monitoring in the ER, pt improved Pt feels improved CXR negative He does have UTI - this would explain fever and  his fatigue However, it does not fully explain his dyspnea and near syncope.  He is mildly hypoxic He requested d/c home I advised that given I have not fully explained dyspnea/near syncope, I advised further workup He would still like to go home - he was informed we have not ruled out life threatening causes such as PE.  He understands this and would still like to go home He reported his dyspnea is not new He was given rocephin here He reports he has bactrim at home as he had UTI previously We discussed strict return precautions.  Family was present for this conversation     Labs Review Labs Reviewed  CBC - Abnormal; Notable for  the following:    Platelets 110 (*)    All other components within normal limits  BASIC METABOLIC PANEL - Abnormal; Notable for the following:    Glucose, Bld 141 (*)    GFR calc non Af Amer 60 (*)    GFR calc Af Amer 69 (*)    All other components within normal limits  URINALYSIS, ROUTINE W REFLEX MICROSCOPIC - Abnormal; Notable for the following:    APPearance CLOUDY (*)    Hgb urine dipstick SMALL (*)    Nitrite POSITIVE (*)    Leukocytes, UA MODERATE (*)    All other components within normal limits  URINE MICROSCOPIC-ADD ON - Abnormal; Notable for the following:    Bacteria, UA MANY (*)    All other components within normal limits  URINE CULTURE  BRAIN NATRIURETIC PEPTIDE  I-STAT TROPOININ, ED    Imaging Review Dg Chest Port 1 View  03/20/2014   CLINICAL DATA:  Fall earlier today. Acute shortness of breath. Chills. Current history of hypertension. Prior CABG.  EXAM: PORTABLE CHEST - 1 VIEW  COMPARISON:  05/06/2012, 06/15/2009.  FINDINGS: Prior sternotomy for CABG. Cardiac silhouette moderately enlarged but stable. Lungs clear. Bronchovascular markings normal. Pulmonary vascularity normal. No visible pleural effusions. No pneumothorax. Stable mild elevation of the right hemidiaphragm. Left shoulder arthroplasty with anatomic alignment.  IMPRESSION:  Stable cardiomegaly.  No acute cardiopulmonary disease.   Electronically Signed   By: Evangeline Dakin M.D.   On: 03/20/2014 20:35    prehospital EKG reviewed - no STEMI when compared to prior     Medications  cefTRIAXone (ROCEPHIN) 1 g in dextrose 5 % 50 mL IVPB (0 g Intravenous Stopped 03/20/14 2329)    MDM   Final diagnoses:  UTI (lower urinary tract infection)  Dyspnea  Near syncope    Nursing notes including past medical history and social history reviewed and considered in documentation Previous records reviewed and considered xrays/imaging reviewed by myself and considered during evaluation Labs/vital reviewed myself and considered during evaluation     Ripley Fraise, MD 03/21/14 0028

## 2014-03-20 NOTE — ED Notes (Signed)
Gave pt 1/2 cup of water, per RN.

## 2014-03-23 LAB — URINE CULTURE: Colony Count: >=100000

## 2014-03-24 ENCOUNTER — Telehealth (HOSPITAL_BASED_OUTPATIENT_CLINIC_OR_DEPARTMENT_OTHER): Payer: Self-pay | Admitting: Emergency Medicine

## 2014-03-24 NOTE — Telephone Encounter (Signed)
Post ED Visit - Positive Culture Follow-up  Culture report reviewed by antimicrobial stewardship pharmacist: []  Wes [place], Pharm.D., BCPS [x]  Heide Guile, Pharm.D., BCPS []  Alycia Rossetti, Pharm.D., BCPS []  [place], Pharm.D., BCPS, AAHIVP []  Legrand Como, Pharm.D., BCPS, AAHIVP []  Isac Sarna, Pharm.D., BCPS  Positive urine culture Klebsiella Treated with none, asymptomatic, no further patient follow-up is required at this time.  Hazle Nordmann 03/24/2014, 3:42 PM

## 2014-06-11 ENCOUNTER — Other Ambulatory Visit (HOSPITAL_BASED_OUTPATIENT_CLINIC_OR_DEPARTMENT_OTHER): Payer: Medicare Other

## 2014-06-11 ENCOUNTER — Encounter: Payer: Self-pay | Admitting: Family

## 2014-06-11 ENCOUNTER — Ambulatory Visit (HOSPITAL_BASED_OUTPATIENT_CLINIC_OR_DEPARTMENT_OTHER): Payer: Medicare Other | Admitting: Family

## 2014-06-11 ENCOUNTER — Telehealth: Payer: Self-pay | Admitting: Hematology & Oncology

## 2014-06-11 VITALS — BP 125/55 | HR 48 | Temp 97.5°F | Resp 20 | Ht 72.0 in | Wt 298.0 lb

## 2014-06-11 DIAGNOSIS — N390 Urinary tract infection, site not specified: Secondary | ICD-10-CM

## 2014-06-11 DIAGNOSIS — C8590 Non-Hodgkin lymphoma, unspecified, unspecified site: Secondary | ICD-10-CM

## 2014-06-11 LAB — CBC WITH DIFFERENTIAL (CANCER CENTER ONLY)
BASO#: 0 10*3/uL (ref 0.0–0.2)
BASO%: 0.2 % (ref 0.0–2.0)
EOS ABS: 0.2 10*3/uL (ref 0.0–0.5)
EOS%: 1.7 % (ref 0.0–7.0)
HEMATOCRIT: 45.9 % (ref 38.7–49.9)
HGB: 15.8 g/dL (ref 13.0–17.1)
LYMPH#: 8.5 10*3/uL — AB (ref 0.9–3.3)
LYMPH%: 64.1 % — AB (ref 14.0–48.0)
MCH: 31.8 pg (ref 28.0–33.4)
MCHC: 34.4 g/dL (ref 32.0–35.9)
MCV: 92 fL (ref 82–98)
MONO#: 0.8 10*3/uL (ref 0.1–0.9)
MONO%: 6.3 % (ref 0.0–13.0)
NEUT#: 3.7 10*3/uL (ref 1.5–6.5)
NEUT%: 27.7 % — ABNORMAL LOW (ref 40.0–80.0)
Platelets: 117 10*3/uL — ABNORMAL LOW (ref 145–400)
RBC: 4.97 10*6/uL (ref 4.20–5.70)
RDW: 14 % (ref 11.1–15.7)
WBC: 13.3 10*3/uL — AB (ref 4.0–10.0)

## 2014-06-11 LAB — COMPREHENSIVE METABOLIC PANEL
ALT: 21 U/L (ref 0–53)
AST: 18 U/L (ref 0–37)
Albumin: 4.3 g/dL (ref 3.5–5.2)
Alkaline Phosphatase: 69 U/L (ref 39–117)
BILIRUBIN TOTAL: 0.4 mg/dL (ref 0.2–1.2)
BUN: 25 mg/dL — AB (ref 6–23)
CALCIUM: 9.3 mg/dL (ref 8.4–10.5)
CHLORIDE: 104 meq/L (ref 96–112)
CO2: 27 mEq/L (ref 19–32)
CREATININE: 1.35 mg/dL (ref 0.50–1.35)
Glucose, Bld: 91 mg/dL (ref 70–99)
Potassium: 5.1 mEq/L (ref 3.5–5.3)
Sodium: 138 mEq/L (ref 135–145)
Total Protein: 6.1 g/dL (ref 6.0–8.3)

## 2014-06-11 LAB — CHCC SATELLITE - SMEAR

## 2014-06-11 NOTE — Progress Notes (Signed)
Hematology and Oncology Follow Up Visit  David Hubbard [id number] [birth year] 74 y.o. 06/11/2014   Principle Diagnosis:  Low-grade non-Hodgkin's lymphoma  Current Therapy:   Observation    Interim History:  David Hubbard is here today for his yearly follow-up. He is doing well and has no complaints at this time.  He does have a UTI and is experiencing some urinary frequency. He is currently on Keflex.  He denies fever, chills, n/v, cough, rash, dizziness, SOB, chest pain, palpitations, abdominal pain, constipation, diarrhea, blood in urine or stool.  No lymphadenopathy found on exam.  He has had no swelling, tenderness, numbness or tingling in his extremities. No new aches or pains.  His appetite is good and he states that he is staying hydrated. His weight is stable.   Medications:    Medication List       This list is accurate as of: 06/11/14 10:57 AM.  Always use your most recent med list.               CO Q10 100 MG Tabs  Generic drug:  Coenzyme Q10  Take 1 capsule by mouth every morning.     Cranberry 250 MG Tabs  Take 1 tablet by mouth every morning.     CVS ASPIRIN 81 MG EC tablet  Generic drug:  aspirin  Take 81 mg by mouth daily. Swallow whole.     diclofenac 75 MG EC tablet  Commonly known as:  VOLTAREN  Take 75 mg by mouth.     diphenhydrAMINE 25 mg capsule  Commonly known as:  BENADRYL  Take 25 mg by mouth at bedtime as needed for allergies.     FISH OIL BURP-LESS 1200 MG Caps  Take 2 capsules by mouth 2 (two) times daily.     loratadine 10 MG tablet  Commonly known as:  CLARITIN  Take 10 mg by mouth daily.     MENS MULTI VITAMIN & MINERAL PO  Take 1 tablet by mouth every morning.     metoprolol tartrate 25 MG tablet  Commonly known as:  LOPRESSOR  Take 25 mg by mouth 2 (two) times daily.     PROSTATE HEALTH Caps  Take 1 capsule by mouth every morning.     RA GRAPE SEED 100 MG Caps  Generic drug:  Grape Seed  Take 1 capsule by mouth every  morning.     Resveratrol 250 MG Caps  Take 1 capsule by mouth daily.     sulfamethoxazole-trimethoprim 800-160 MG per tablet  Commonly known as:  BACTRIM DS,SEPTRA DS  Take 1 tablet by mouth 2 (two) times daily.     traZODone 50 MG tablet  Commonly known as:  DESYREL  Take 50 mg by mouth at bedtime.     trimethoprim 100 MG tablet  Commonly known as:  TRIMPEX  Take 100 mg by mouth daily.  Start taking on:  06/15/2015     VITAMIN B COMPLEX PO  Take 1 tablet by mouth every morning.     Vitamin C CR 1000 MG Tbcr  Take 1 tablet by mouth every morning.     Vitamin D-3 5000 UNITS Tabs  Take 1 tablet by mouth daily.     WELCHOL 3.75 G Pack  Generic drug:  Colesevelam HCl  Take 1 packet by mouth daily.        Allergies:  Allergies  Allergen Reactions  . Contrast Media [Iodinated Diagnostic Agents] Nausea And Vomiting    Past Medical  History, Surgical history, Social history, and Family History were reviewed and updated.  Review of Systems: All other 10 point review of systems is negative.   Physical Exam:  height is 6' (1.829 m) and weight is 298 lb (135.172 kg). His oral temperature is 97.5 F (36.4 C). His blood pressure is 125/55 and his pulse is 48. His respiration is 20.   Wt Readings from Last 3 Encounters:  06/11/14 298 lb (135.172 kg)  06/12/13 288 lb (130.636 kg)  10/13/12 295 lb (133.811 kg)    Ocular: Sclerae unicteric, pupils equal, round and reactive to light Ear-nose-throat: Oropharynx clear, dentition fair Lymphatic: No cervical or supraclavicular adenopathy Lungs no rales or rhonchi, good excursion bilaterally Heart regular rate and rhythm, no murmur appreciated Abd soft, nontender, positive bowel sounds MSK no focal spinal tenderness, no joint edema Neuro: non-focal, well-oriented, appropriate affect   Lab Results  Component Value Date   WBC 13.3* 06/11/2014   HGB 15.8 06/11/2014   HCT 45.9 06/11/2014   MCV 92 06/11/2014   PLT 117*  06/11/2014   No results found for: FERRITIN, IRON, TIBC, UIBC, IRONPCTSAT Lab Results  Component Value Date   RETICCTPCT 0.8 07/11/2011   RBC 4.97 06/11/2014   RETICCTABS 39.7 07/11/2011   Lab Results  Component Value Date   KPAFRELGTCHN 1.28 07/11/2011   LAMBDASER 1.58 07/11/2011   KAPLAMBRATIO 0.81 07/11/2011   Lab Results  Component Value Date   IGGSERUM 668 07/11/2011   IGA 122 07/11/2011   IGMSERUM 39* 07/11/2011   Lab Results  Component Value Date   TOTALPROTELP 6.3 10/13/2012   ALBUMINELP 65.1 10/13/2012   A1GS 4.3 10/13/2012   A2GS 9.8 10/13/2012   BETS 6.2 10/13/2012   BETA2SER 4.1 10/13/2012   GAMS 10.5* 10/13/2012   MSPIKE NOT DET 10/13/2012   SPEI * 10/13/2012     Chemistry      Component Value Date/Time   NA 138 03/20/2014 2012   K 4.1 03/20/2014 2012   CL 107 03/20/2014 2012   CO2 21 03/20/2014 2012   BUN 17 03/20/2014 2012   CREATININE 1.19 03/20/2014 2012      Component Value Date/Time   CALCIUM 9.5 03/20/2014 2012   ALKPHOS 59 07/11/2011 1526   AST 24 07/11/2011 1526   ALT 38 07/11/2011 1526   BILITOT 0.4 07/11/2011 1526     Impression and Plan: David Hubbard is a 74 year old gentleman with a history of low-grade non-Hodgkin's lymphoma. He is doing well and is asymptomatic at this time.  His WBC count today was 13.3. He is on Keflex for a UTI.  No anemia. Dr, Marin Olp will view his smear later today.  He asked that we move his follow-up appointment out to 2 years. This will be fine.  He knows to contact us with any questions or concerns. We can certainly see him sooner if need be.   Eliezer Bottom, NP 6/3/201610:57 AM

## 2014-06-11 NOTE — Telephone Encounter (Signed)
Pt will call for future appt

## 2014-06-16 LAB — PROTEIN ELECTROPHORESIS, SERUM, WITH REFLEX
ALBUMIN ELP: 4.1 g/dL (ref 3.8–4.8)
ALPHA-1-GLOBULIN: 0.3 g/dL (ref 0.2–0.3)
Alpha-2-Globulin: 0.6 g/dL (ref 0.5–0.9)
BETA 2: 0.3 g/dL (ref 0.2–0.5)
Beta Globulin: 0.3 g/dL — ABNORMAL LOW (ref 0.4–0.6)
GAMMA GLOBULIN: 0.6 g/dL — AB (ref 0.8–1.7)
TOTAL PROTEIN, SERUM ELECTROPHOR: 6.1 g/dL (ref 6.1–8.1)

## 2014-06-16 LAB — LACTATE DEHYDROGENASE: LDH: 134 U/L (ref 94–250)

## 2016-05-08 ENCOUNTER — Encounter: Payer: Self-pay | Admitting: Hematology & Oncology

## 2018-08-04 ENCOUNTER — Inpatient Hospital Stay: Payer: Medicare HMO

## 2018-08-04 ENCOUNTER — Other Ambulatory Visit: Payer: Self-pay

## 2018-08-04 ENCOUNTER — Inpatient Hospital Stay: Payer: Medicare HMO | Attending: Hematology & Oncology | Admitting: Hematology & Oncology

## 2018-08-04 ENCOUNTER — Encounter: Payer: Self-pay | Admitting: Hematology & Oncology

## 2018-08-04 ENCOUNTER — Other Ambulatory Visit: Payer: Self-pay | Admitting: *Deleted

## 2018-08-04 VITALS — BP 128/75 | HR 58 | Temp 98.0°F | Resp 20 | Wt 285.6 lb

## 2018-08-04 DIAGNOSIS — Z96652 Presence of left artificial knee joint: Secondary | ICD-10-CM | POA: Insufficient documentation

## 2018-08-04 DIAGNOSIS — Z87442 Personal history of urinary calculi: Secondary | ICD-10-CM | POA: Diagnosis not present

## 2018-08-04 DIAGNOSIS — C8590 Non-Hodgkin lymphoma, unspecified, unspecified site: Secondary | ICD-10-CM | POA: Diagnosis not present

## 2018-08-04 DIAGNOSIS — E7800 Pure hypercholesterolemia, unspecified: Secondary | ICD-10-CM | POA: Diagnosis not present

## 2018-08-04 DIAGNOSIS — I252 Old myocardial infarction: Secondary | ICD-10-CM | POA: Diagnosis not present

## 2018-08-04 DIAGNOSIS — I10 Essential (primary) hypertension: Secondary | ICD-10-CM | POA: Insufficient documentation

## 2018-08-04 DIAGNOSIS — C8299 Follicular lymphoma, unspecified, extranodal and solid organ sites: Secondary | ICD-10-CM

## 2018-08-04 DIAGNOSIS — Z951 Presence of aortocoronary bypass graft: Secondary | ICD-10-CM | POA: Insufficient documentation

## 2018-08-04 DIAGNOSIS — R634 Abnormal weight loss: Secondary | ICD-10-CM | POA: Diagnosis not present

## 2018-08-04 DIAGNOSIS — Z7982 Long term (current) use of aspirin: Secondary | ICD-10-CM | POA: Diagnosis not present

## 2018-08-04 DIAGNOSIS — C4490 Unspecified malignant neoplasm of skin, unspecified: Secondary | ICD-10-CM

## 2018-08-04 DIAGNOSIS — Z85828 Personal history of other malignant neoplasm of skin: Secondary | ICD-10-CM | POA: Insufficient documentation

## 2018-08-04 DIAGNOSIS — F419 Anxiety disorder, unspecified: Secondary | ICD-10-CM | POA: Diagnosis not present

## 2018-08-04 DIAGNOSIS — Z79899 Other long term (current) drug therapy: Secondary | ICD-10-CM

## 2018-08-04 DIAGNOSIS — I2510 Atherosclerotic heart disease of native coronary artery without angina pectoris: Secondary | ICD-10-CM | POA: Diagnosis not present

## 2018-08-04 DIAGNOSIS — G4730 Sleep apnea, unspecified: Secondary | ICD-10-CM | POA: Insufficient documentation

## 2018-08-04 DIAGNOSIS — Z87891 Personal history of nicotine dependence: Secondary | ICD-10-CM | POA: Insufficient documentation

## 2018-08-04 DIAGNOSIS — D696 Thrombocytopenia, unspecified: Secondary | ICD-10-CM | POA: Insufficient documentation

## 2018-08-04 DIAGNOSIS — F329 Major depressive disorder, single episode, unspecified: Secondary | ICD-10-CM | POA: Diagnosis not present

## 2018-08-04 LAB — CBC WITH DIFFERENTIAL (CANCER CENTER ONLY)
Abs Immature Granulocytes: 0.07 10*3/uL (ref 0.00–0.07)
Basophils Absolute: 0.1 10*3/uL (ref 0.0–0.1)
Basophils Relative: 0 %
Eosinophils Absolute: 0.2 10*3/uL (ref 0.0–0.5)
Eosinophils Relative: 1 %
HCT: 47.8 % (ref 39.0–52.0)
Hemoglobin: 15.6 g/dL (ref 13.0–17.0)
Immature Granulocytes: 0 %
Lymphocytes Relative: 84 %
Lymphs Abs: 22.9 10*3/uL — ABNORMAL HIGH (ref 0.7–4.0)
MCH: 31 pg (ref 26.0–34.0)
MCHC: 32.6 g/dL (ref 30.0–36.0)
MCV: 94.8 fL (ref 80.0–100.0)
Monocytes Absolute: 0.7 10*3/uL (ref 0.1–1.0)
Monocytes Relative: 3 %
Neutro Abs: 3.1 10*3/uL (ref 1.7–7.7)
Neutrophils Relative %: 12 %
Platelet Count: 76 10*3/uL — ABNORMAL LOW (ref 150–400)
RBC: 5.04 MIL/uL (ref 4.22–5.81)
RDW: 14.2 % (ref 11.5–15.5)
WBC Count: 27 10*3/uL — ABNORMAL HIGH (ref 4.0–10.5)
nRBC: 0 % (ref 0.0–0.2)

## 2018-08-04 LAB — CMP (CANCER CENTER ONLY)
ALT: 9 U/L (ref 0–44)
AST: 12 U/L — ABNORMAL LOW (ref 15–41)
Albumin: 4 g/dL (ref 3.5–5.0)
Alkaline Phosphatase: 65 U/L (ref 38–126)
Anion gap: 8 (ref 5–15)
BUN: 15 mg/dL (ref 8–23)
CO2: 26 mmol/L (ref 22–32)
Calcium: 8.8 mg/dL — ABNORMAL LOW (ref 8.9–10.3)
Chloride: 106 mmol/L (ref 98–111)
Creatinine: 1.03 mg/dL (ref 0.61–1.24)
GFR, Est AFR Am: >60 mL/min (ref >60)
GFR, Estimated: >60 mL/min (ref >60)
Glucose, Bld: 112 mg/dL — ABNORMAL HIGH (ref 70–99)
Potassium: 4.2 mmol/L (ref 3.5–5.1)
Sodium: 140 mmol/L (ref 135–145)
Total Bilirubin: 0.5 mg/dL (ref 0.3–1.2)
Total Protein: 5.9 g/dL — ABNORMAL LOW (ref 6.5–8.1)

## 2018-08-04 NOTE — Progress Notes (Signed)
Referral MD  Reason for Referral: Progressive low-grade for her non-Hodgkin's lymphoma  Chief Complaint  Patient presents with  . New Patient (Initial Visit)    "Elevated white level"  : My doctor told me my white cell count is higher.  HPI: David Hubbard is well-known to me.  He is a 78 year old white male.  I have not seen him for 4 years.  We last saw him back in June 2016.  At that time, he had a diagnosis of low-grade non-Hodgkin's lymphoma.  He was told asymptomatic.  He did not have any follow-up that he kept with this.  He obviously has been doing fairly well.  However, he saw his family doctor recently.  His white count was 13,000.  Because of this, his family doctor felt that he needed to come back to see [place].  He comes in with his daughter.  He has had some weight loss.  This is not been a lot.  His weight loss is about 14 pounds since we saw him 4 years ago.  Is not noted any swollen lymph nodes.  He has had no abdominal pain.  He has had no cough.  He has had no obvious swollen lymph nodes.  He has had no change in bowel or bladder habits.  He has had no rashes.  There is been no surgery since we last saw him.  I will think his medications have really changed since we last saw him.  Currently, his performance status is ECOG 1.   Past Medical History:  Diagnosis Date  . Anginal pain ([place])   . Anxiety   . Chronic lower back pain   . Coronary artery disease   . Depression   . High cholesterol   . Hypertension   . Kidney stones   . Myocardial infarction ([place]) 2001  . Non-Hodgkin lymphoma ([place])    followed by Dr. Marin Olp for proable low grade non-Hodgkin lymphoma  . Osteoarthritis of right knee 05/26/2012  . Skin cancer    "arms & legs" (05/27/2012)  . Sleep apnea   :  Past Surgical History:  Procedure Laterality Date  . CARDIAC CATHETERIZATION    . CORONARY ARTERY BYPASS GRAFT  2001   CABG X3  . JOINT REPLACEMENT    . KNEE ARTHROSCOPY Bilateral   . LITHOTRIPSY    .  SKIN CANCER EXCISION     "arms and legs" (05/27/2012)  . TOTAL KNEE ARTHROPLASTY Left   . TOTAL KNEE ARTHROPLASTY Right 05/26/2012   Procedure: TOTAL KNEE ARTHROPLASTY- right;  Surgeon: Johnny Bridge, MD;  Location: [facility];  Service: Orthopedics;  Laterality: Right;  . TOTAL SHOULDER REPLACEMENT Left 06/2009   Archie Endo 06/21/2009 (05/27/2012)  :   Current Outpatient Medications:  .  aspirin (CVS ASPIRIN) 81 MG EC tablet, Take 81 mg by mouth daily. Swallow whole., Disp: , Rfl:  .  diphenhydrAMINE (BENADRYL) 25 mg capsule, Take 25 mg by mouth at bedtime as needed for allergies., Disp: , Rfl:  .  Evolocumab (REPATHA SURECLICK) 412 MG/ML SOAJ, INJECT 1ML INTO THE SKIN EVERY 14 DAYS., Disp: , Rfl:  .  metoprolol tartrate (LOPRESSOR) 25 MG tablet, Take 25 mg by mouth 2 (two) times daily. , Disp: , Rfl:  .  Ascorbic Acid (VITAMIN C CR) 1000 MG TBCR, Take 1 tablet by mouth every morning. , Disp: , Rfl:  .  B Complex Vitamins (VITAMIN B COMPLEX PO), Take 1 tablet by mouth every morning. , Disp: , Rfl:  .  Cholecalciferol (VITAMIN D-3) 5000 UNITS TABS, Take 1 tablet by mouth daily. , Disp: , Rfl:  .  Coenzyme Q10 (CO Q10) 100 MG TABS, Take 1 capsule by mouth every morning. , Disp: , Rfl:  .  Colesevelam HCl (WELCHOL) 3.75 G PACK, Take 1 packet by mouth daily., Disp: , Rfl:  .  Cranberry 250 MG TABS, Take 1 tablet by mouth every morning. , Disp: , Rfl:  .  Grape Seed (RA GRAPE SEED) 100 MG CAPS, Take 1 capsule by mouth every morning. , Disp: , Rfl:  .  loratadine (CLARITIN) 10 MG tablet, Take 10 mg by mouth daily., Disp: , Rfl:  .  Misc Natural Products (PROSTATE HEALTH) CAPS, Take 1 capsule by mouth every morning. , Disp: , Rfl:  .  Multiple Vitamins-Minerals (MENS MULTI VITAMIN & MINERAL PO), Take 1 tablet by mouth every morning. , Disp: , Rfl:  .  Omega-3 Fatty Acids (FISH OIL BURP-LESS) 1200 MG CAPS, Take 2 capsules by mouth 2 (two) times daily. , Disp: , Rfl:  .  Resveratrol 250 MG CAPS, Take 1 capsule  by mouth daily., Disp: , Rfl:  .  traZODone (DESYREL) 50 MG tablet, Take 50 mg by mouth at bedtime. , Disp: , Rfl:  .  trimethoprim (TRIMPEX) 100 MG tablet, Take 100 mg by mouth daily. , Disp: , Rfl: 2:  :  Allergies  Allergen Reactions  . Contrast Media [Iodinated Diagnostic Agents] Nausea And Vomiting  :  History reviewed. No pertinent family history.:  Social History   Socioeconomic History  . Marital status: Divorced    Spouse name: Not on file  . Number of children: Not on file  . Years of education: Not on file  . Highest education level: Not on file  Occupational History  . Not on file  Social Needs  . Financial resource strain: Not on file  . Food insecurity    Worry: Not on file    Inability: Not on file  . Transportation needs    Medical: Not on file    Non-medical: Not on file  Tobacco Use  . Smoking status: Former Smoker    Packs/day: 1.00    Years: 20.00    Pack years: 20.00    Types: Cigarettes    Start date: 11/13/1979    Quit date: 01/09/1999    Years since quitting: 19.5  . Smokeless tobacco: Former Systems developer    Quit date: 01/09/1999  . Tobacco comment: quit smoking 14 years ago  Substance and Sexual Activity  . Alcohol use: Yes    Alcohol/week: 2.0 standard drinks    Types: 1 Glasses of wine, 1 Cans of beer per week  . Drug use: No  . Sexual activity: Not on file  Lifestyle  . Physical activity    Days per week: Not on file    Minutes per session: Not on file  . Stress: Not on file  Relationships  . Social Herbalist on phone: Not on file    Gets together: Not on file    Attends religious service: Not on file    Active member of club or organization: Not on file    Attends meetings of clubs or organizations: Not on file    Relationship status: Not on file  . Intimate partner violence    Fear of current or ex partner: Not on file    Emotionally abused: Not on file    Physically abused: Not on file  Forced sexual activity: Not on  file  Other Topics Concern  . Not on file  Social History Narrative  . Not on file  :  Review of Systems  Constitutional: Negative.   HENT: Negative.   Eyes: Negative.   Respiratory: Negative.   Cardiovascular: Negative.   Gastrointestinal: Negative.   Genitourinary: Negative.   Musculoskeletal: Negative.   Skin: Negative.   Neurological: Negative.   Endo/Heme/Allergies: Negative.   Psychiatric/Behavioral: Negative.      Exam:   Fairly well-developed well-nourished white male in no obvious distress.  Vital signs show temperature of 98.  Pulse 58.  Blood pressure 128/75.  Weight is 285 pounds.  Head neck exam shows no ocular or oral lesions.  He has no scleral icterus.  He does have some slight fullness in both sides of his neck.  He probably has some enlarged lymph nodes in his neck.  He has some under the right jaw.  There is some in the left posterior cervical region.  I cannot palpate any supraclavicular lymph nodes.  Axillary exam shows no obvious axillary adenopathy.  Lungs are clear bilaterally.  Cardiac exam regular rate and rhythm with no murmurs, rubs or bruits.  Abdomen is soft.  He is slightly obese.  He has good bowel sounds.  There is no fluid wave.  There is no inguinal adenopathy bilaterally.  He has no palpable liver or spleen tip.  Back exam shows no tenderness over the spine, ribs or hips.  Extremities shows no clubbing, cyanosis or edema.  Neurological exam shows no focal neurological deficits.  Skin exam shows some hyperpigmented lesions.  Nothing looks suspicious.       08/04/18 1520  NA 140  K 4.2  CL 106  CO2 26  GLUCOSE 112*  BUN 15  CREATININE 1.03  CALCIUM 8.8*    Blood smear review: Normochromic and normocytic population of red blood cells.  He has no nucleated red blood cells.  There are no schistocytes or spherocytes.  He has no rouleaux formation.  White cells are markedly lymphocytic.  He has some atypical lymphocytes.  I see no blasts.  His  monocytes and neutrophils appear mature.  He has no hyper segmented polys.  Platelets are decreased in number.  Platelets are well granulated.  Pathology:  None    Assessment and Plan: David Hubbard is a nice 78 year old white male.  He has a long history of low-grade non-Hodgkin's lymphoma.  I think it is now to the point where he is going need to be treated.  He has thrombocytopenia which to me, is the most significant indicator for treatment.  I will have to get a CT scan of his neck and body.  I would like to see if he does have lymphadenopathy.  I do think that a bone marrow biopsy is necessary.  I pretty sure that his bone marrow is involved by this low-grade lymphoma process.  I think that we are probably going to have to start therapy on him.  I think that once we prove that he does have aggressive lymphoma, that we can probably use Gazyva/bendamustine.  I think this would be very reasonable to use I think success rate would be over 90%.  I spoke to he and his daughter for about an hour.  I reviewed his lab work.  I explained my recommendations.  I explained why I thought a bone marrow biopsy was indicated.  We will get the CAT scans this week.  We  will try to get the bone marrow biopsy next week.  I would like to see him back in about 3 weeks.  By then, I will have had results back today in the we can come up with a game plan.  Answered all of their questions.  I told him that after 4 years of not seen him, he really is not doing all that bad that we have very good treatment now so that we can get him into remission and get his blood counts normalized.

## 2018-08-05 LAB — LACTATE DEHYDROGENASE: LDH: 129 U/L (ref 98–192)

## 2018-08-07 ENCOUNTER — Ambulatory Visit (HOSPITAL_BASED_OUTPATIENT_CLINIC_OR_DEPARTMENT_OTHER)
Admission: RE | Admit: 2018-08-07 | Discharge: 2018-08-07 | Disposition: A | Discharge status: Home / Self Care | Payer: Medicare HMO | Source: Ambulatory Visit | Attending: Hematology & Oncology | Admitting: Hematology & Oncology

## 2018-08-07 ENCOUNTER — Other Ambulatory Visit: Payer: Self-pay

## 2018-08-07 DIAGNOSIS — C8299 Follicular lymphoma, unspecified, extranodal and solid organ sites: Secondary | ICD-10-CM | POA: Diagnosis not present

## 2018-08-08 ENCOUNTER — Telehealth: Payer: Self-pay | Admitting: *Deleted

## 2018-08-08 NOTE — Telephone Encounter (Signed)
As noted below by Dr. Marin Olp, I informed patient that there were some enlarged lymph nodes only in the abdomen; however, they aren't large enough that you would feel them or they would cause any blockages. Let's wait on the bone marrow study. He verbalized understanding.

## 2018-08-08 NOTE — Telephone Encounter (Signed)
-----   Message from [name], MD sent at 08/07/2018  4:50 PM EDT ----- Call - there are some enlarged lymph nodes in the abdomen only. They are not large enough that you would feel them or that they would cause any problems like blockages!!  Will now await the bone marrow study!!  David Hubbard

## 2018-08-11 ENCOUNTER — Other Ambulatory Visit: Payer: Self-pay | Admitting: Radiology

## 2018-08-12 ENCOUNTER — Other Ambulatory Visit: Payer: Self-pay

## 2018-08-12 ENCOUNTER — Ambulatory Visit (HOSPITAL_COMMUNITY)
Admission: RE | Admit: 2018-08-12 | Discharge: 2018-08-12 | Disposition: A | Discharge status: Home / Self Care | Payer: Medicare HMO | Source: Ambulatory Visit | Attending: Hematology & Oncology | Admitting: Hematology & Oncology

## 2018-08-12 ENCOUNTER — Encounter (HOSPITAL_COMMUNITY): Payer: Self-pay

## 2018-08-12 DIAGNOSIS — Z96612 Presence of left artificial shoulder joint: Secondary | ICD-10-CM | POA: Insufficient documentation

## 2018-08-12 DIAGNOSIS — Z951 Presence of aortocoronary bypass graft: Secondary | ICD-10-CM | POA: Diagnosis not present

## 2018-08-12 DIAGNOSIS — I252 Old myocardial infarction: Secondary | ICD-10-CM | POA: Diagnosis not present

## 2018-08-12 DIAGNOSIS — Z96653 Presence of artificial knee joint, bilateral: Secondary | ICD-10-CM | POA: Insufficient documentation

## 2018-08-12 DIAGNOSIS — G4730 Sleep apnea, unspecified: Secondary | ICD-10-CM | POA: Diagnosis not present

## 2018-08-12 DIAGNOSIS — E7800 Pure hypercholesterolemia, unspecified: Secondary | ICD-10-CM | POA: Diagnosis not present

## 2018-08-12 DIAGNOSIS — C9110 Chronic lymphocytic leukemia of B-cell type not having achieved remission: Secondary | ICD-10-CM | POA: Diagnosis not present

## 2018-08-12 DIAGNOSIS — Z87442 Personal history of urinary calculi: Secondary | ICD-10-CM | POA: Diagnosis not present

## 2018-08-12 DIAGNOSIS — C8299 Follicular lymphoma, unspecified, extranodal and solid organ sites: Secondary | ICD-10-CM | POA: Insufficient documentation

## 2018-08-12 DIAGNOSIS — Z87891 Personal history of nicotine dependence: Secondary | ICD-10-CM | POA: Insufficient documentation

## 2018-08-12 DIAGNOSIS — Z91041 Radiographic dye allergy status: Secondary | ICD-10-CM | POA: Insufficient documentation

## 2018-08-12 DIAGNOSIS — I2510 Atherosclerotic heart disease of native coronary artery without angina pectoris: Secondary | ICD-10-CM | POA: Diagnosis not present

## 2018-08-12 DIAGNOSIS — Z79899 Other long term (current) drug therapy: Secondary | ICD-10-CM | POA: Diagnosis not present

## 2018-08-12 DIAGNOSIS — Z7982 Long term (current) use of aspirin: Secondary | ICD-10-CM | POA: Diagnosis not present

## 2018-08-12 DIAGNOSIS — D696 Thrombocytopenia, unspecified: Secondary | ICD-10-CM | POA: Insufficient documentation

## 2018-08-12 DIAGNOSIS — I10 Essential (primary) hypertension: Secondary | ICD-10-CM | POA: Diagnosis not present

## 2018-08-12 DIAGNOSIS — Z85828 Personal history of other malignant neoplasm of skin: Secondary | ICD-10-CM | POA: Diagnosis not present

## 2018-08-12 LAB — CBC WITH DIFFERENTIAL/PLATELET
Abs Immature Granulocytes: 0.04 10*3/uL (ref 0.00–0.07)
Basophils Absolute: 0.1 10*3/uL (ref 0.0–0.1)
Basophils Relative: 0 %
Eosinophils Absolute: 0.1 10*3/uL (ref 0.0–0.5)
Eosinophils Relative: 1 %
HCT: 50 % (ref 39.0–52.0)
Hemoglobin: 16.3 g/dL (ref 13.0–17.0)
Immature Granulocytes: 0 %
Lymphocytes Relative: 84 %
Lymphs Abs: 21.7 10*3/uL — ABNORMAL HIGH (ref 0.7–4.0)
MCH: 31.1 pg (ref 26.0–34.0)
MCHC: 32.6 g/dL (ref 30.0–36.0)
MCV: 95.4 fL (ref 80.0–100.0)
Monocytes Absolute: 0.7 10*3/uL (ref 0.1–1.0)
Monocytes Relative: 3 %
Neutro Abs: 3.2 10*3/uL (ref 1.7–7.7)
Neutrophils Relative %: 12 %
Platelets: 77 10*3/uL — ABNORMAL LOW (ref 150–400)
RBC: 5.24 MIL/uL (ref 4.22–5.81)
RDW: 14.1 % (ref 11.5–15.5)
WBC: 25.9 10*3/uL — ABNORMAL HIGH (ref 4.0–10.5)
nRBC: 0 % (ref 0.0–0.2)

## 2018-08-12 LAB — PROTIME-INR
INR: 1 (ref 0.8–1.2)
Prothrombin Time: 12.8 seconds (ref 11.4–15.2)

## 2018-08-12 MED ORDER — SODIUM CHLORIDE 0.9 % IV SOLN
INTRAVENOUS | Status: DC
  Administered 2018-08-12: 08:00:00 via INTRAVENOUS

## 2018-08-12 MED ORDER — FENTANYL CITRATE (PF) 100 MCG/2ML IJ SOLN
INTRAMUSCULAR | Status: AC
  Filled 2018-08-12: qty 2

## 2018-08-12 MED ORDER — LIDOCAINE HCL (PF) 1 % IJ SOLN
INTRAMUSCULAR | Status: AC | PRN
  Administered 2018-08-12: 10 mL

## 2018-08-12 MED ORDER — FENTANYL CITRATE (PF) 100 MCG/2ML IJ SOLN
INTRAMUSCULAR | Status: AC | PRN
  Administered 2018-08-12 (×2): 50 ug via INTRAVENOUS

## 2018-08-12 MED ORDER — MIDAZOLAM HCL 2 MG/2ML IJ SOLN
INTRAMUSCULAR | Status: AC | PRN
  Administered 2018-08-12 (×2): 1 mg via INTRAVENOUS

## 2018-08-12 MED ORDER — MIDAZOLAM HCL 2 MG/2ML IJ SOLN
INTRAMUSCULAR | Status: AC
  Filled 2018-08-12: qty 4

## 2018-08-12 NOTE — Procedures (Signed)
Lymphoma  S/p CT BM asp and core bx  No comp Stable ebl min Path pending Full report in pacs

## 2018-08-12 NOTE — Discharge Instructions (Signed)
Bone Marrow Aspiration and Bone Marrow Biopsy, Adult, Care After °This sheet gives you information about how to care for yourself after your procedure. Your health care provider may also give you more specific instructions. If you have problems or questions, contact your health care provider. °What can I expect after the procedure? °After the procedure, it is common to have: °· Mild pain and tenderness. °· Swelling. °· Bruising. °Follow these instructions at home: °Puncture site care ° °  ° °· Follow instructions from your health care provider about how to take care of the puncture site. Make sure you: °? Wash your hands with soap and water before you change your bandage (dressing). If soap and water are not available, use hand sanitizer. °? Change your dressing as told by your health care provider. °· Check your puncture site every day for signs of infection. Check for: °? More redness, swelling, or pain. °? More fluid or blood. °? Warmth. °? Pus or a bad smell. °General instructions °· Take over-the-counter and prescription medicines only as told by your health care provider. °· Do not take baths, swim, or use a hot tub until your health care provider approves. Ask if you can take a shower or have a sponge bath. °· Return to your normal activities as told by your health care provider. Ask your health care provider what activities are safe for you. °· Do not drive for 24 hours if you were given a medicine to help you relax (sedative) during your procedure. °· Keep all follow-up visits as told by your health care provider. This is important. °Contact a health care provider if: °· Your pain is not controlled with medicine. °Get help right away if: °· You have a fever. °· You have more redness, swelling, or pain around the puncture site. °· You have more fluid or blood coming from the puncture site. °· Your puncture site feels warm to the touch. °· You have pus or a bad smell coming from the puncture site. °These  symptoms may represent a serious problem that is an emergency. Do not wait to see if the symptoms will go away. Get medical help right away. Call your local emergency services (911 in the [place] Do not drive yourself to the hospital. °Summary °· After the procedure, it is common to have mild pain, tenderness, swelling, and bruising. °· Follow instructions from your health care provider about how to take care of the puncture site. °· Get help right away if you have any symptoms of infection or if you have more blood or fluid coming from the puncture site. °This information is not intended to replace advice given to you by your health care provider. Make sure you discuss any questions you have with your health care provider. °Document Released: 07/14/2004 Document Revised: 04/09/2017 Document Reviewed: 06/08/2015 °Elsevier Patient Education © 2020 [employer] ° ° ° ° °Moderate Conscious Sedation, Adult, Care After °These instructions provide you with information about caring for yourself after your procedure. Your health care provider may also give you more specific instructions. Your treatment has been planned according to current medical practices, but problems sometimes occur. Call your health care provider if you have any problems or questions after your procedure. °What can I expect after the procedure? °After your procedure, it is common: °· To feel sleepy for several hours. °· To feel clumsy and have poor balance for several hours. °· To have poor judgment for several hours. °· To vomit if you eat too soon. °  Follow these instructions at home: °For at least 24 hours after the procedure: ° °· Do not: °? Participate in activities where you could fall or become injured. °? Drive. °? Use heavy machinery. °? Drink alcohol. °? Take sleeping pills or medicines that cause drowsiness. °? Make important decisions or sign legal documents. °? Take care of children on your own. °· Rest. °Eating and drinking °· Follow the  diet recommended by your health care provider. °· If you vomit: °? Drink water, juice, or soup when you can drink without vomiting. °? Make sure you have little or no nausea before eating solid foods. °General instructions °· Have a responsible adult stay with you until you are awake and alert. °· Take over-the-counter and prescription medicines only as told by your health care provider. °· If you smoke, do not smoke without supervision. °· Keep all follow-up visits as told by your health care provider. This is important. °Contact a health care provider if: °· You keep feeling nauseous or you keep vomiting. °· You feel light-headed. °· You develop a rash. °· You have a fever. °Get help right away if: °· You have trouble breathing. °This information is not intended to replace advice given to you by your health care provider. Make sure you discuss any questions you have with your health care provider. °Document Released: 10/15/2012 Document Revised: 12/07/2016 Document Reviewed: 04/16/2015 °Elsevier Patient Education © 2020 [employer] Inc. ° °

## 2018-08-12 NOTE — Consult Note (Signed)
Chief Complaint: Patient was seen in consultation today for CT-guided bone marrow biopsy  Referring Physician(s): Ennever,Peter R  Supervising Physician: Daryll Brod  Patient Status: [facility] - Out-pt  History of Present Illness: David Hubbard is a 78 y.o. 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  He is scheduled today for CT-guided bone marrow biopsy for further evaluation.  Additional medical history as below.  Past Medical History:  Diagnosis Date   Anginal pain (HCC)    Anxiety    Chronic lower back pain    Coronary artery disease    Depression    High cholesterol    Hypertension    Kidney stones    Myocardial infarction ([place]) 2001   Non-Hodgkin lymphoma ([place])    followed by Dr. Marin Olp for proable low grade non-Hodgkin lymphoma   Osteoarthritis of right knee 05/26/2012   Skin cancer    "arms & legs" (05/27/2012)   Sleep apnea     Past Surgical History:  Procedure Laterality Date   CARDIAC CATHETERIZATION     CORONARY ARTERY BYPASS GRAFT  2001   CABG X3   JOINT REPLACEMENT     KNEE ARTHROSCOPY Bilateral    LITHOTRIPSY     SKIN CANCER EXCISION     "arms and legs" (05/27/2012)   TOTAL KNEE ARTHROPLASTY Left    TOTAL KNEE ARTHROPLASTY Right 05/26/2012   Procedure: TOTAL KNEE ARTHROPLASTY- right;  Surgeon: Johnny Bridge, MD;  Location: [place];  Service: Orthopedics;  Laterality: Right;   TOTAL SHOULDER REPLACEMENT Left 06/2009   Archie Endo 06/21/2009 (05/27/2012)    Allergies: Contrast media [iodinated diagnostic agents]  Medications: Prior to Admission medications   Medication Sig Start Date End Date Taking? Authorizing Provider  Ascorbic Acid (VITAMIN C CR) 1000 MG TBCR Take 1 tablet by mouth every morning.     [provider]  aspirin ([employer] ASPIRIN) 81 MG EC tablet Take 81 mg by mouth daily. Swallow whole.    [provider]  B Complex Vitamins (VITAMIN B COMPLEX PO) Take 1 tablet by mouth every morning.     [provider]  Cholecalciferol (VITAMIN D-3) 5000 UNITS TABS Take 1 tablet by mouth daily.     [provider]  Coenzyme Q10 (CO Q10) 100 MG TABS Take 1 capsule by mouth every morning.     [provider]  Colesevelam HCl [facility]) 3.75 G PACK Take 1 packet by mouth daily.    [provider]  Cranberry 250 MG TABS Take 1 tablet by mouth every morning.     [provider]  diphenhydrAMINE (BENADRYL) 25 mg capsule Take 25 mg by mouth at bedtime as needed for allergies.    [provider]  Evolocumab (REPATHA SURECLICK) 419 MG/ML SOAJ INJECT 1ML INTO THE SKIN EVERY 14 DAYS. 09/03/16   [provider]  Grape Seed (RA GRAPE SEED) 100 MG CAPS Take 1 capsule by mouth every morning.     [provider]  loratadine (CLARITIN) 10 MG tablet Take 10 mg by mouth daily.    [provider]  metoprolol tartrate (LOPRESSOR) 25 MG tablet Take 25 mg by mouth 2 (two) times daily.  04/20/11   [provider]  Misc Natural Products (Bricelyn) CAPS Take 1 capsule by mouth every morning.     [provider]  Multiple Vitamins-Minerals (MENS MULTI VITAMIN & MINERAL PO) Take 1 tablet by mouth  every morning.     [provider]  Omega-3 Fatty Acids (FISH OIL BURP-LESS) 1200 MG CAPS Take 2 capsules by mouth 2 (two) times daily.     [provider]  Resveratrol 250 MG CAPS Take 1 capsule by mouth daily.    [provider]  traZODone (DESYREL) 50 MG tablet Take 50 mg by mouth at bedtime.  07/30/12   [provider]  trimethoprim (TRIMPEX) 100 MG tablet Take 100 mg by mouth daily.  06/15/15   [provider]     History reviewed. No pertinent family history.  Social History   Socioeconomic History   Marital status: Divorced    Spouse name: Not on file   Number of children:  Not on file   Years of education: Not on file   Highest education level: Not on file  Occupational History   Not on file  Social Needs   Financial resource strain: Not on file   Food insecurity    Worry: Not on file    Inability: Not on file   Transportation needs    Medical: Not on file    Non-medical: Not on file  Tobacco Use   Smoking status: Former Smoker    Packs/day: 1.00    Years: 20.00    Pack years: 20.00    Types: Cigarettes    Start date: 11/13/1979    Quit date: 01/09/1999    Years since quitting: 19.6   Smokeless tobacco: Former Systems developer    Quit date: 01/09/1999   Tobacco comment: quit smoking 14 years ago  Substance and Sexual Activity   Alcohol use: Yes    Alcohol/week: 2.0 standard drinks    Types: 1 Glasses of wine, 1 Cans of beer per week   Drug use: No   Sexual activity: Not on file  Lifestyle   Physical activity    Days per week: Not on file    Minutes per session: Not on file   Stress: Not on file  Relationships   Social connections    Talks on phone: Not on file    Gets together: Not on file    Attends religious service: Not on file    Active member of club or organization: Not on file    Attends meetings of clubs or organizations: Not on file    Relationship status: Not on file  Other Topics Concern   Not on file  Social History Narrative   Not on file      Review of Systems denies fever, headache, chest pain, dyspnea, cough, abdominal/back pain, nausea, vomiting or bleeding.  Vital Signs: BP (!) 146/88 (BP Location: Right Arm)    Pulse 63    Temp 98 F (36.7 C) (Oral)    Resp 18    Ht _0  (1.88 m)    Wt 187 lb (84.8 kg)    SpO2 94%    BMI 24.01 kg/m   Physical Exam awake, alert, flat affect.  Chest clear to auscultation bilat; heart with regular rate and rhythm.  Abdomen soft, positive bowel sounds, nontender.  Trace pretibial edema bilaterally.  Imaging: Ct Abdomen Pelvis Wo Contrast  Result Date: 08/07/2018 CLINICAL  DATA:  Follicular lymphoma. EXAM: CT CHEST, ABDOMEN AND PELVIS WITHOUT CONTRAST TECHNIQUE: Multidetector CT imaging of the chest, abdomen and pelvis was performed following the standard protocol without IV contrast. COMPARISON:  CTA chest 03/01/2017. CT abdomen/pelvis 03/01/2017 FINDINGS: CT CHEST FINDINGS Cardiovascular: The heart size is normal. No substantial pericardial  effusion. Coronary artery calcification is evident. Status post CABG. Atherosclerotic calcification is noted in the wall of the thoracic aorta. Mediastinum/Nodes: No mediastinal lymphadenopathy. No evidence for gross hilar lymphadenopathy although assessment is limited by the lack of intravenous contrast on today's study. The esophagus has normal imaging features. There is no axillary lymphadenopathy. Lungs/Pleura: No suspicious pulmonary nodule or mass. No focal airspace consolidation. Basilar predominant bronchiectasis associated with subpleural reticulation suggests a component of underlying fibrotic lung disease. No pleural effusion. Musculoskeletal: Left shoulder replacement. Degenerative changes noted right shoulder. No worrisome lytic or sclerotic osseous abnormality. CT ABDOMEN PELVIS FINDINGS Hepatobiliary: The liver shows diffusely decreased attenuation suggesting fat deposition. No focal abnormality in the liver on this study without intravenous contrast. There is no evidence for gallstones, gallbladder wall thickening, or pericholecystic fluid. No intrahepatic or extrahepatic biliary dilation. Pancreas: No focal mass lesion. No dilatation of the main duct. No intraparenchymal cyst. No peripancreatic edema. Spleen: Spleen is enlarged at 19.3 cm craniocaudal length, but stable in the interval. Adrenals/Urinary Tract: No adrenal nodule or mass. Right kidney unremarkable on this noncontrast study. 2.9 cm water density lesion upper pole left kidney has increased from 1.9 cm previously but remains compatible with a cyst. 6 mm nonobstructing  stone identified upper pole left kidney with 3 mm nonobstructing stone identified in the lower pole. No evidence for hydroureter. The urinary bladder appears normal for the degree of distention. Stomach/Bowel: Stomach decompressed. Duodenum is normally positioned as is the ligament of Treitz. No small bowel wall thickening. No small bowel dilatation. The terminal ileum is normal. The appendix is normal. No gross colonic mass. No colonic wall thickening. Diverticular changes are noted in the left colon without evidence of diverticulitis. Vascular/Lymphatic: There is abdominal aortic atherosclerosis without aneurysm. Small lymph nodes are seen in the gastrohepatic and hepato duodenal ligaments, similar to prior. Dominant lymph node in the hepato duodenal ligament is mildly enlarged and measures 2.1 cm short axis (61/3), stable. Mildly enlarged retroperitoneal lymph nodes are unchanged. 11 mm portal caval node on 71/3 is stable. 1.7 cm short axis left para-aortic node on 74/3 is unchanged and the 15 mm index preaortic node measured previously is 14 mm today on 80/3. No pelvic sidewall lymphadenopathy. Reproductive: The prostate gland and seminal vesicles are unremarkable. Other: No intraperitoneal free fluid. Musculoskeletal: No worrisome lytic or sclerotic osseous abnormality. Advanced degenerative disc disease noted at T12-L1, and L2-3. Mild spondylolisthesis noted L4-5. IMPRESSION: 1. Mild lymphadenopathy in the upper abdomen and retroperitoneal space is stable since 03/01/2017. No new or progressive findings on today's study to suggest lymphoma progression. 2. Stable splenomegaly. 3. Hepatic steatosis. 4. Left renal cyst with nonobstructing left renal stones. 5.  Aortic Atherosclerois (ICD10-170.0) 6. Degenerative disc disease in the lumbar spine. Electronically Signed   By: Misty Stanley M.D.   On: 08/07/2018 12:58   Ct Soft Tissue Neck Wo Contrast  Result Date: 08/07/2018 CLINICAL DATA:  78 year old male with  history of follicular lymphoma. EXAM: CT NECK WITHOUT CONTRAST TECHNIQUE: Multidetector CT imaging of the neck was performed following the standard protocol without intravenous contrast. COMPARISON:  CT Chest, Abdomen, and Pelvis today reported separately. FINDINGS: Pharynx and larynx: The glottis is closed. Laryngeal and pharyngeal soft tissue contours are within normal limits. Negative parapharyngeal and retropharyngeal spaces. Salivary glands: Negative sublingual space, noncontrast submandibular and parotid glands. Thyroid: Diminutive, negative. Lymph nodes: There are somewhat numerous small round lymph nodes in the bilateral level 3 nodal stations, the largest are 5-6 millimeters (series 8,  image 62), and these are more numerous on the right (sagittal image 16). By comparison, the level 1 and level 2 nodal stations demonstrate small nodes with more apparent fatty hila. There is a 7-8 millimeter short axis node at the left level 2 B station on series 8, image 46. Occasional level 5 nodes measure up to 6 millimeters such as on series 8, image 18. No cervical nodes are enlarged by size criteria. Vascular: Vascular patency is not evaluated in the absence of IV contrast. Calcified carotid atherosclerosis in the neck is greater on left. Limited intracranial: Negative. Visualized orbits: Negative. Mastoids and visualized paranasal sinuses: Right maxillary sinus mucoperiosteal thickening. Mild left maxillary alveolar recess mucosal thickening. Other visible paranasal sinuses and the mastoids are well pneumatized. Skeleton: Disc and endplate degeneration throughout the cervical spine. No acute or suspicious osseous lesion in the neck. Upper chest: Reported separately. IMPRESSION: 1. Difficult to exclude active lymphoma in the neck given mild lymphadenopathy in the form of an increased number of small round lymph nodes, most numerous at the right level 3 station. Considering the lack of previous neck imaging a 3 month  short interval follow-up study might be valuable. 2.  CT Chest, Abdomen, and Pelvis today are reported separately. 3. Chronic right maxillary sinusitis. Electronically Signed   By: Genevie Ann M.D.   On: 08/07/2018 17:01   Ct Chest Wo Contrast  Result Date: 08/07/2018 CLINICAL DATA:  Follicular lymphoma. EXAM: CT CHEST, ABDOMEN AND PELVIS WITHOUT CONTRAST TECHNIQUE: Multidetector CT imaging of the chest, abdomen and pelvis was performed following the standard protocol without IV contrast. COMPARISON:  CTA chest 03/01/2017. CT abdomen/pelvis 03/01/2017 FINDINGS: CT CHEST FINDINGS Cardiovascular: The heart size is normal. No substantial pericardial effusion. Coronary artery calcification is evident. Status post CABG. Atherosclerotic calcification is noted in the wall of the thoracic aorta. Mediastinum/Nodes: No mediastinal lymphadenopathy. No evidence for gross hilar lymphadenopathy although assessment is limited by the lack of intravenous contrast on today's study. The esophagus has normal imaging features. There is no axillary lymphadenopathy. Lungs/Pleura: No suspicious pulmonary nodule or mass. No focal airspace consolidation. Basilar predominant bronchiectasis associated with subpleural reticulation suggests a component of underlying fibrotic lung disease. No pleural effusion. Musculoskeletal: Left shoulder replacement. Degenerative changes noted right shoulder. No worrisome lytic or sclerotic osseous abnormality. CT ABDOMEN PELVIS FINDINGS Hepatobiliary: The liver shows diffusely decreased attenuation suggesting fat deposition. No focal abnormality in the liver on this study without intravenous contrast. There is no evidence for gallstones, gallbladder wall thickening, or pericholecystic fluid. No intrahepatic or extrahepatic biliary dilation. Pancreas: No focal mass lesion. No dilatation of the main duct. No intraparenchymal cyst. No peripancreatic edema. Spleen: Spleen is enlarged at 19.3 cm craniocaudal  length, but stable in the interval. Adrenals/Urinary Tract: No adrenal nodule or mass. Right kidney unremarkable on this noncontrast study. 2.9 cm water density lesion upper pole left kidney has increased from 1.9 cm previously but remains compatible with a cyst. 6 mm nonobstructing stone identified upper pole left kidney with 3 mm nonobstructing stone identified in the lower pole. No evidence for hydroureter. The urinary bladder appears normal for the degree of distention. Stomach/Bowel: Stomach decompressed. Duodenum is normally positioned as is the ligament of Treitz. No small bowel wall thickening. No small bowel dilatation. The terminal ileum is normal. The appendix is normal. No gross colonic mass. No colonic wall thickening. Diverticular changes are noted in the left colon without evidence of diverticulitis. Vascular/Lymphatic: There is abdominal aortic atherosclerosis without aneurysm. Small lymph  nodes are seen in the gastrohepatic and hepato duodenal ligaments, similar to prior. Dominant lymph node in the hepato duodenal ligament is mildly enlarged and measures 2.1 cm short axis (61/3), stable. Mildly enlarged retroperitoneal lymph nodes are unchanged. 11 mm portal caval node on 71/3 is stable. 1.7 cm short axis left para-aortic node on 74/3 is unchanged and the 15 mm index preaortic node measured previously is 14 mm today on 80/3. No pelvic sidewall lymphadenopathy. Reproductive: The prostate gland and seminal vesicles are unremarkable. Other: No intraperitoneal free fluid. Musculoskeletal: No worrisome lytic or sclerotic osseous abnormality. Advanced degenerative disc disease noted at T12-L1, and L2-3. Mild spondylolisthesis noted L4-5. IMPRESSION: 1. Mild lymphadenopathy in the upper abdomen and retroperitoneal space is stable since 03/01/2017. No new or progressive findings on today's study to suggest lymphoma progression. 2. Stable splenomegaly. 3. Hepatic steatosis. 4. Left renal cyst with  nonobstructing left renal stones. 5.  Aortic Atherosclerois (ICD10-170.0) 6. Degenerative disc disease in the lumbar spine. Electronically Signed   By: Misty Stanley M.D.   On: 08/07/2018 12:58    Labs:  CBC: Recent Labs    08/04/18 1520 08/12/18 0730  WBC 27.0* 25.9*  HGB 15.6 16.3  HCT 47.8 50.0  PLT 76* 77*    COAGS: Recent Labs    08/12/18 0730  INR 1.0    BMP: Recent Labs    08/04/18 1520  NA 140  K 4.2  CL 106  CO2 26  GLUCOSE 112*  BUN 15  CALCIUM 8.8*  CREATININE 1.03  GFRNONAA >60  GFRAA >60    LIVER FUNCTION TESTS: Recent Labs    08/04/18 1520  BILITOT 0.5  AST 12*  ALT 9  ALKPHOS 65  PROT 5.9*  ALBUMIN 4.0    TUMOR MARKERS: No results for input(s): AFPTM, CEA, CA199, CHROMGRNA in the last 8760 hours.  Assessment and Plan: 78 y.o. 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  He is scheduled today for CT-guided bone marrow biopsy for further evaluation. Risks and benefits of procedure was discussed with the patient  including, but not limited to bleeding, infection, damage to adjacent structures or low yield requiring additional tests.  All of the questions were answered and there is agreement to proceed.  Consent signed and in chart.     Thank you for this interesting consult.  I greatly enjoyed meeting David Hubbard and look forward to participating in their care.  A copy of this report was sent to the requesting provider on this date.  Electronically Signed: D. Rowe Robert, PA-C 08/12/2018, 8:41 AM   I spent a total of 20 minutes    in face to face in clinical consultation, greater than 50% of which was counseling/coordinating care for CT-guided bone marrow biopsy

## 2018-08-18 ENCOUNTER — Encounter (HOSPITAL_COMMUNITY): Payer: Self-pay | Admitting: Hematology & Oncology

## 2018-08-25 ENCOUNTER — Other Ambulatory Visit: Payer: Self-pay | Admitting: Hematology & Oncology

## 2018-08-25 ENCOUNTER — Inpatient Hospital Stay: Payer: Medicare HMO

## 2018-08-25 ENCOUNTER — Telehealth: Payer: Self-pay | Admitting: Hematology & Oncology

## 2018-08-25 ENCOUNTER — Inpatient Hospital Stay: Payer: Medicare HMO | Attending: Hematology & Oncology | Admitting: Hematology & Oncology

## 2018-08-25 ENCOUNTER — Encounter: Payer: Self-pay | Admitting: Hematology & Oncology

## 2018-08-25 ENCOUNTER — Other Ambulatory Visit: Payer: Self-pay

## 2018-08-25 DIAGNOSIS — Z7189 Other specified counseling: Secondary | ICD-10-CM

## 2018-08-25 DIAGNOSIS — Z5111 Encounter for antineoplastic chemotherapy: Secondary | ICD-10-CM | POA: Insufficient documentation

## 2018-08-25 DIAGNOSIS — C8590 Non-Hodgkin lymphoma, unspecified, unspecified site: Secondary | ICD-10-CM | POA: Diagnosis present

## 2018-08-25 DIAGNOSIS — C8308 Small cell B-cell lymphoma, lymph nodes of multiple sites: Secondary | ICD-10-CM

## 2018-08-25 DIAGNOSIS — Z79899 Other long term (current) drug therapy: Secondary | ICD-10-CM | POA: Diagnosis not present

## 2018-08-25 DIAGNOSIS — C8299 Follicular lymphoma, unspecified, extranodal and solid organ sites: Secondary | ICD-10-CM

## 2018-08-25 HISTORY — DX: Small cell b-cell lymphoma, lymph nodes of multiple sites: C83.08

## 2018-08-25 HISTORY — DX: Other specified counseling: Z71.89

## 2018-08-25 LAB — CMP (CANCER CENTER ONLY)
ALT: 11 U/L (ref 0–44)
AST: 12 U/L — ABNORMAL LOW (ref 15–41)
Albumin: 4 g/dL (ref 3.5–5.0)
Alkaline Phosphatase: 68 U/L (ref 38–126)
Anion gap: 11 (ref 5–15)
BUN: 20 mg/dL (ref 8–23)
CO2: 23 mmol/L (ref 22–32)
Calcium: 8.9 mg/dL (ref 8.9–10.3)
Chloride: 106 mmol/L (ref 98–111)
Creatinine: 1.1 mg/dL (ref 0.61–1.24)
GFR, Est AFR Am: >60 mL/min (ref >60)
GFR, Estimated: >60 mL/min (ref >60)
Glucose, Bld: 136 mg/dL — ABNORMAL HIGH (ref 70–99)
Potassium: 4 mmol/L (ref 3.5–5.1)
Sodium: 140 mmol/L (ref 135–145)
Total Bilirubin: 0.5 mg/dL (ref 0.3–1.2)
Total Protein: 5.9 g/dL — ABNORMAL LOW (ref 6.5–8.1)

## 2018-08-25 LAB — CBC WITH DIFFERENTIAL (CANCER CENTER ONLY)
Abs Immature Granulocytes: 0.05 10*3/uL (ref 0.00–0.07)
Basophils Absolute: 0.1 10*3/uL (ref 0.0–0.1)
Basophils Relative: 0 %
Eosinophils Absolute: 0.2 10*3/uL (ref 0.0–0.5)
Eosinophils Relative: 1 %
HCT: 48.1 % (ref 39.0–52.0)
Hemoglobin: 15.6 g/dL (ref 13.0–17.0)
Immature Granulocytes: 0 %
Lymphocytes Relative: 84 %
Lymphs Abs: 20.6 10*3/uL — ABNORMAL HIGH (ref 0.7–4.0)
MCH: 30.5 pg (ref 26.0–34.0)
MCHC: 32.4 g/dL (ref 30.0–36.0)
MCV: 94.1 fL (ref 80.0–100.0)
Monocytes Absolute: 0.4 10*3/uL (ref 0.1–1.0)
Monocytes Relative: 2 %
Neutro Abs: 3.1 10*3/uL (ref 1.7–7.7)
Neutrophils Relative %: 13 %
Platelet Count: 78 10*3/uL — ABNORMAL LOW (ref 150–400)
RBC: 5.11 MIL/uL (ref 4.22–5.81)
RDW: 14 % (ref 11.5–15.5)
WBC Count: 24.4 10*3/uL — ABNORMAL HIGH (ref 4.0–10.5)
nRBC: 0 % (ref 0.0–0.2)

## 2018-08-25 LAB — LACTATE DEHYDROGENASE: LDH: 141 U/L (ref 98–192)

## 2018-08-25 LAB — SAVE SMEAR(SSMR), FOR PROVIDER SLIDE REVIEW

## 2018-08-25 MED ORDER — ALLOPURINOL 100 MG PO TABS: 100.0000 mg | ORAL_TABLET | Freq: Every day | ORAL | 0 refills | Status: DC

## 2018-08-25 MED ORDER — FAMCICLOVIR 250 MG PO TABS: 250.0000 mg | ORAL_TABLET | Freq: Every day | ORAL | 6 refills | Status: DC

## 2018-08-25 NOTE — Progress Notes (Signed)
Hematology and Oncology Follow Up Visit  David Hubbard [id number] 04-Nov-1940 78 y.o. 08/25/2018   Principle Diagnosis:   Diffuse small lymphocytic lymphoma  Current Therapy:    Rituxan/bendamustine --cycle #1 to start on 09/02/2018     Interim History:  David Hubbard is back for follow-up.  We did do our work-up on him.  Ultimately, the bone marrow biopsy that David Hubbard had done was important.  This was done on 08/12/2018.  The pathology report ([id number]) showed a hypercellular marrow with involvement of a B-cell lymphoproliferative process.  It was felt this was consistent with small cell lymphocytic lymphoma.  David Hubbard does have thrombocytopenia.  I think based on the thrombocytopenia, if I have to institute therapy on him.  We did do a CT scan of his body and neck.  This showed adenopathy which was not bulky by any means.  The largest adenopathy was probably in his upper abdomen measuring 2.3 cm.  I do think that David Hubbard would be a good candidate for therapy.  I forgot to mention that cytogenetics that we sent off on the bone marrow came back normal.  I think that Rituxan/bendamustine would be a good idea for him.  I think the response rate should be 95%.  I think the chance of toxicity would be minimal.  David Hubbard would not need to have a Port-A-Cath placed.  David Hubbard has a decent peripheral access.  I talked to David Hubbard and his daughter for about 45 minutes or so.  I gave him information sheets about the treatment protocol.  I explained the side effects.  I told him that the first time that David Hubbard gets the Rituxan it will take probably 6-8 hours.  The bendamustine should hopefully take no more than 30 minutes.  I think that we will certainly know how well things are going are by his white cell count and platelet count.  David Hubbard overall feels well.  David Hubbard will need allopurinol to help prevent tumor lysis.  David Hubbard will also need Famvir to help prevent shingles.  David Hubbard has had no cough.  There is been no change in bowel or bladder habits.   David Hubbard has had no mouth sores.  David Hubbard has had no dysphasia or odynophagia.  Overall, his performance status is ECOG 1.  Medications:  Current Outpatient Medications:    allopurinol (ZYLOPRIM) 100 MG tablet, Take 1 tablet (100 mg total) by mouth daily., Disp: 30 tablet, Rfl: 0   Ascorbic Acid (VITAMIN C CR) 1000 MG TBCR, Take 1 tablet by mouth every morning. , Disp: , Rfl:    aspirin ([employer] ASPIRIN) 81 MG EC tablet, Take 81 mg by mouth daily. Swallow whole., Disp: , Rfl:    B Complex Vitamins (VITAMIN B COMPLEX PO), Take 1 tablet by mouth every morning. , Disp: , Rfl:    Cholecalciferol (VITAMIN D-3) 5000 UNITS TABS, Take 1 tablet by mouth daily. , Disp: , Rfl:    Coenzyme Q10 (CO Q10) 100 MG TABS, Take 1 capsule by mouth every morning. , Disp: , Rfl:    Colesevelam HCl (WELCHOL) 3.75 G PACK, Take 1 packet by mouth daily., Disp: , Rfl:    Cranberry 250 MG TABS, Take 1 tablet by mouth every morning. , Disp: , Rfl:    diphenhydrAMINE (BENADRYL) 25 mg capsule, Take 25 mg by mouth at bedtime as needed for allergies., Disp: , Rfl:    Evolocumab (REPATHA SURECLICK) 194 MG/ML SOAJ, INJECT 1ML INTO THE SKIN EVERY 14 DAYS., Disp: , Rfl:  famciclovir (FAMVIR) 250 MG tablet, Take 1 tablet (250 mg total) by mouth daily., Disp: 30 tablet, Rfl: 6   Grape Seed (RA GRAPE SEED) 100 MG CAPS, Take 1 capsule by mouth every morning. , Disp: , Rfl:    loratadine (CLARITIN) 10 MG tablet, Take 10 mg by mouth daily., Disp: , Rfl:    metoprolol tartrate (LOPRESSOR) 25 MG tablet, Take 25 mg by mouth 2 (two) times daily. , Disp: , Rfl:    Misc Natural Products (PROSTATE HEALTH) CAPS, Take 1 capsule by mouth every morning. , Disp: , Rfl:    Multiple Vitamins-Minerals (MENS MULTI VITAMIN & MINERAL PO), Take 1 tablet by mouth every morning. , Disp: , Rfl:    Omega-3 Fatty Acids (FISH OIL BURP-LESS) 1200 MG CAPS, Take 2 capsules by mouth 2 (two) times daily. , Disp: , Rfl:    Resveratrol 250 MG CAPS, Take 1 capsule  by mouth daily., Disp: , Rfl:    traZODone (DESYREL) 50 MG tablet, Take 50 mg by mouth at bedtime. , Disp: , Rfl:    trimethoprim (TRIMPEX) 100 MG tablet, Take 100 mg by mouth daily. , Disp: , Rfl: 2  Allergies:  Allergies  Allergen Reactions   Iodinated Diagnostic Agents Nausea And Vomiting    Pt now reports David Hubbard experiences nausea and vomiting with IV contrast agents after contrast was given tonight     Other Nausea And Vomiting    Pt now reports David Hubbard experiences nausea and vomiting with IV contrast agents after contrast was given tonight    Statins Other (See Comments)    myalgias    Past Medical History, Surgical history, Social history, and Family History were reviewed and updated.  Review of Systems: Review of Systems  Unable to perform ROS: Age  Constitutional: Negative.   HENT:  Negative.   Eyes: Negative.   Respiratory: Negative.   Cardiovascular: Negative.   Gastrointestinal: Negative.   Endocrine: Negative.   Genitourinary: Negative.    Musculoskeletal: Negative.   Skin: Negative.   Neurological: Negative.   Hematological: Negative.   Psychiatric/Behavioral: Negative.     Physical Exam:  weight is 285 lb (129.3 kg). His oral temperature is 97.1 F (36.2 C) (abnormal). His blood pressure is 131/63 and his pulse is 64. His respiration is 20 and oxygen saturation is 94%.   Wt Readings from Last 3 Encounters:  08/25/18 285 lb (129.3 kg)  08/12/18 187 lb (84.8 kg)  08/04/18 285 lb 9.6 oz (129.5 kg)    Physical Exam Vitals signs reviewed.  HENT:     Head: Normocephalic and atraumatic.  Eyes:     Pupils: Pupils are equal, round, and reactive to light.  Neck:     Musculoskeletal: Normal range of motion.  Cardiovascular:     Rate and Rhythm: Normal rate and regular rhythm.     Heart sounds: Normal heart sounds.  Pulmonary:     Effort: Pulmonary effort is normal.     Breath sounds: Normal breath sounds.  Abdominal:     General: Bowel sounds are normal.       Palpations: Abdomen is soft.  Musculoskeletal: Normal range of motion.        General: No tenderness or deformity.  Lymphadenopathy:     Cervical: No cervical adenopathy.  Skin:    General: Skin is warm and dry.     Findings: No erythema or rash.  Neurological:     Mental Status: David Hubbard is alert and oriented to person, place, and  time.  Psychiatric:        Behavior: Behavior normal.        Thought Content: Thought content normal.        Judgment: Judgment normal.      Lab Results  Component Value Date   WBC 24.4 (H) 08/25/2018   HGB 15.6 08/25/2018   HCT 48.1 08/25/2018   MCV 94.1 08/25/2018   PLT 78 (L) 08/25/2018     Chemistry      Component Value Date/Time   NA 140 08/25/2018 1107   K 4.0 08/25/2018 1107   CL 106 08/25/2018 1107   CO2 23 08/25/2018 1107   BUN 20 08/25/2018 1107   CREATININE 1.10 08/25/2018 1107      Component Value Date/Time   CALCIUM 8.9 08/25/2018 1107   ALKPHOS 68 08/25/2018 1107   AST 12 (L) 08/25/2018 1107   ALT 11 08/25/2018 1107   BILITOT 0.5 08/25/2018 1107       Impression and Plan: Mr. Sangiovanni is a 78 year old white male.  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  I think that we would go ahead with treatment on him.  I think Rituxan/bendamustine would be a very reasonable option.  We will be able to I think treat him in 4 cycles and then we assess him.  I will plan for a follow-up CT scan after his second cycle of treatment.  I answered all their questions.  I reviewed the bone marrow biopsy with them.  I reviewed the CT scan with them.  We will go ahead and get started with treatment next week.  I really think that the risk of tumor lysis will be quite low.  However, I still think allopurinol would not be a bad idea.  I will plan to see him back when David Hubbard starts his second cycle of treatment.  This will be in September.   [name], MD 8/17/20202:45 PM

## 2018-08-25 NOTE — Telephone Encounter (Signed)
Appointments scheduled calendar printed per 8/17 los 

## 2018-08-25 NOTE — Progress Notes (Signed)
START OFF PATHWAY REGIMEN - Lymphoma and CLL   [id number]:Bendamustine 90 mg/m2 IV D1,2 + Rituximab (IV/SUBQ) D1 q28 Days x 6 Cycles:   Cycle 1: A cycle is 28 days:     Rituximab-xxxx      Bendamustine    Cycles 2 through 6: A cycle is every 28 days:     Rituximab and hyaluronidase human      Bendamustine   **Always confirm dose/schedule in your pharmacy ordering system**  Patient Characteristics: Small Lymphocytic Lymphoma (SLL), First Line, Treatment Indicated, 17p del(-)/Unknown and ATM Mutation Negative/Unknown and TP53 Mutation Negative/Unknown, Age ? 90 or Frail (Any Age) Disease Type: Small Lymphocytic Lymphoma (SLL) Disease Type: Not Applicable Disease Type: Not Applicable [place] Stage: III Line of Therapy: First Line Treatment Indicated<= Treatment Indicated ATM Mutation Status: Negative 17p Deletion Status: Negative TP53 Mutation Status: Negative Patient Age: ? 57 Patient Condition: Fit Patient Intent of Therapy: Curative Intent, Discussed with Patient

## 2018-08-26 ENCOUNTER — Encounter: Payer: Self-pay | Admitting: *Deleted

## 2018-08-26 ENCOUNTER — Inpatient Hospital Stay: Payer: Medicare HMO

## 2018-08-26 LAB — HEPATITIS PANEL, ACUTE
HCV Ab: <0.1 s/co ratio (ref 0.0–0.9)
Hep A IgM: NEGATIVE
Hep B C IgM: NEGATIVE
Hepatitis B Surface Ag: NEGATIVE

## 2018-08-26 LAB — KAPPA/LAMBDA LIGHT CHAINS
Kappa free light chain: 12.1 mg/L (ref 3.3–19.4)
Kappa, lambda light chain ratio: 0.7 (ref 0.26–1.65)
Lambda free light chains: 17.4 mg/L (ref 5.7–26.3)

## 2018-08-26 LAB — BETA 2 MICROGLOBULIN, SERUM: Beta-2 Microglobulin: 2.9 mg/L — ABNORMAL HIGH (ref 0.6–2.4)

## 2018-08-26 LAB — IGG, IGA, IGM
IgA: 71 mg/dL (ref 61–437)
IgG (Immunoglobin G), Serum: 528 mg/dL — ABNORMAL LOW (ref 603–1613)
IgM (Immunoglobulin M), Srm: 14 mg/dL — ABNORMAL LOW (ref 15–143)

## 2018-08-26 NOTE — Patient Instructions (Signed)
Patient in chemotherapy education class with wife.   Discussed side effects of Rituxan and Treanda which include but are not limited to myelosuppression, decreased appetite, fatigue, fever, allergic or infusional reaction, mucositis, cardiac toxicity, cough, SOB, altered taste, nausea and vomiting, diarrhea, constipation, elevated LFTs myalgia and arthralgias, hair loss or thinning, rash, skin dryness, nail changes, peripheral neuropathy, discolored urine, delayed wound healing, mental changes (Chemo brain), increased risk of infections, weight loss.  Reviewed infusion room and office policy and procedure and phone numbers 24 hours x 7 days a week.  Reviewed when to call the office with any concerns or problems.  Scientist, clinical (histocompatibility and immunogenetics) given.  Discussed portacath insertion and EMLA cream administration.  Antiemetic protocol and chemotherapy schedule reviewed. Patient verbalized understanding of chemotherapy indications and possible side effects.  Teachback done

## 2018-08-27 LAB — PROTEIN ELECTROPHORESIS, SERUM, WITH REFLEX
A/G Ratio: 1.5 (ref 0.7–1.7)
Albumin ELP: 3.7 g/dL (ref 2.9–4.4)
Alpha-1-Globulin: 0.2 g/dL (ref 0.0–0.4)
Alpha-2-Globulin: 0.7 g/dL (ref 0.4–1.0)
Beta Globulin: 1 g/dL (ref 0.7–1.3)
Gamma Globulin: 0.5 g/dL (ref 0.4–1.8)
Globulin, Total: 2.5 g/dL (ref 2.2–3.9)
M-Spike, %: 0.1 g/dL — ABNORMAL HIGH
SPEP Interpretation: 0
Total Protein ELP: 6.2 g/dL (ref 6.0–8.5)

## 2018-08-27 LAB — IMMUNOFIXATION REFLEX, SERUM
IgA: 70 mg/dL (ref 61–437)
IgG (Immunoglobin G), Serum: 517 mg/dL — ABNORMAL LOW (ref 603–1613)
IgM (Immunoglobulin M), Srm: 16 mg/dL (ref 15–143)

## 2018-09-01 LAB — FLOW CYTOMETRY

## 2018-09-02 ENCOUNTER — Other Ambulatory Visit: Payer: Self-pay | Admitting: *Deleted

## 2018-09-02 DIAGNOSIS — C8308 Small cell B-cell lymphoma, lymph nodes of multiple sites: Secondary | ICD-10-CM

## 2018-09-02 MED ORDER — PROCHLORPERAZINE MALEATE 10 MG PO TABS: 10.0000 mg | ORAL_TABLET | Freq: Four times a day (QID) | ORAL | 1 refills | Status: DC | PRN

## 2018-09-02 MED ORDER — ONDANSETRON HCL 8 MG PO TABS: 8.0000 mg | ORAL_TABLET | Freq: Two times a day (BID) | ORAL | 1 refills | Status: DC | PRN

## 2018-09-04 ENCOUNTER — Inpatient Hospital Stay: Payer: Medicare HMO

## 2018-09-04 ENCOUNTER — Inpatient Hospital Stay (HOSPITAL_BASED_OUTPATIENT_CLINIC_OR_DEPARTMENT_OTHER): Payer: Medicare HMO | Admitting: Hematology & Oncology

## 2018-09-04 ENCOUNTER — Telehealth: Payer: Self-pay | Admitting: *Deleted

## 2018-09-04 ENCOUNTER — Encounter: Payer: Self-pay | Admitting: Hematology & Oncology

## 2018-09-04 ENCOUNTER — Other Ambulatory Visit: Payer: Self-pay

## 2018-09-04 VITALS — BP 140/90 | HR 83

## 2018-09-04 VITALS — BP 156/74 | HR 66 | Temp 98.1°F | Resp 20 | Wt 286.0 lb

## 2018-09-04 DIAGNOSIS — R112 Nausea with vomiting, unspecified: Secondary | ICD-10-CM

## 2018-09-04 DIAGNOSIS — C8308 Small cell B-cell lymphoma, lymph nodes of multiple sites: Secondary | ICD-10-CM

## 2018-09-04 DIAGNOSIS — R6889 Other general symptoms and signs: Secondary | ICD-10-CM

## 2018-09-04 DIAGNOSIS — Z5111 Encounter for antineoplastic chemotherapy: Secondary | ICD-10-CM | POA: Diagnosis not present

## 2018-09-04 LAB — CMP (CANCER CENTER ONLY)
ALT: 12 U/L (ref 0–44)
AST: 13 U/L — ABNORMAL LOW (ref 15–41)
Albumin: 4.4 g/dL (ref 3.5–5.0)
Alkaline Phosphatase: 78 U/L (ref 38–126)
Anion gap: 9 (ref 5–15)
BUN: 20 mg/dL (ref 8–23)
CO2: 24 mmol/L (ref 22–32)
Calcium: 9.1 mg/dL (ref 8.9–10.3)
Chloride: 106 mmol/L (ref 98–111)
Creatinine: 1.2 mg/dL (ref 0.61–1.24)
GFR, Est AFR Am: >60 mL/min (ref >60)
GFR, Estimated: 59 mL/min — ABNORMAL LOW (ref >60)
Glucose, Bld: 120 mg/dL — ABNORMAL HIGH (ref 70–99)
Potassium: 4.3 mmol/L (ref 3.5–5.1)
Sodium: 139 mmol/L (ref 135–145)
Total Bilirubin: 0.6 mg/dL (ref 0.3–1.2)
Total Protein: 6.6 g/dL (ref 6.5–8.1)

## 2018-09-04 LAB — CBC WITH DIFFERENTIAL (CANCER CENTER ONLY)
Abs Immature Granulocytes: 0.05 10*3/uL (ref 0.00–0.07)
Basophils Absolute: 0.1 10*3/uL (ref 0.0–0.1)
Basophils Relative: 0 %
Eosinophils Absolute: 0.2 10*3/uL (ref 0.0–0.5)
Eosinophils Relative: 1 %
HCT: 49.6 % (ref 39.0–52.0)
Hemoglobin: 16.1 g/dL (ref 13.0–17.0)
Immature Granulocytes: 0 %
Lymphocytes Relative: 83 %
Lymphs Abs: 22.2 10*3/uL — ABNORMAL HIGH (ref 0.7–4.0)
MCH: 30.7 pg (ref 26.0–34.0)
MCHC: 32.5 g/dL (ref 30.0–36.0)
MCV: 94.7 fL (ref 80.0–100.0)
Monocytes Absolute: 0.6 10*3/uL (ref 0.1–1.0)
Monocytes Relative: 2 %
Neutro Abs: 3.7 10*3/uL (ref 1.7–7.7)
Neutrophils Relative %: 14 %
Platelet Count: 73 10*3/uL — ABNORMAL LOW (ref 150–400)
RBC: 5.24 MIL/uL (ref 4.22–5.81)
RDW: 13.9 % (ref 11.5–15.5)
WBC Count: 26.8 10*3/uL — ABNORMAL HIGH (ref 4.0–10.5)
nRBC: 0 % (ref 0.0–0.2)

## 2018-09-04 LAB — LACTATE DEHYDROGENASE: LDH: 137 U/L (ref 98–192)

## 2018-09-04 LAB — SAVE SMEAR(SSMR), FOR PROVIDER SLIDE REVIEW

## 2018-09-04 MED ORDER — MEPERIDINE HCL 25 MG/ML IJ SOLN
25.0000 mg | Freq: Once | INTRAMUSCULAR | Status: AC
  Administered 2018-09-04: 25 mg via INTRAVENOUS

## 2018-09-04 MED ORDER — METHYLPREDNISOLONE SODIUM SUCC 125 MG IJ SOLR
INTRAMUSCULAR | Status: AC
  Filled 2018-09-04: qty 2

## 2018-09-04 MED ORDER — LORAZEPAM 2 MG/ML IJ SOLN
0.5000 mg | Freq: Once | INTRAMUSCULAR | Status: AC
  Administered 2018-09-04: 11:00:00 0.5 mg via INTRAVENOUS

## 2018-09-04 MED ORDER — ACETAMINOPHEN 325 MG PO TABS
650.0000 mg | ORAL_TABLET | Freq: Once | ORAL | Status: AC
  Administered 2018-09-04: 650 mg via ORAL

## 2018-09-04 MED ORDER — ONDANSETRON HCL 4 MG/2ML IJ SOLN: 8.0000 mg | Freq: Once | INTRAMUSCULAR | Status: DC

## 2018-09-04 MED ORDER — ACETAMINOPHEN 325 MG PO TABS
ORAL_TABLET | ORAL | Status: AC
  Filled 2018-09-04: qty 2

## 2018-09-04 MED ORDER — SODIUM CHLORIDE 0.9 % IV SOLN
81.0000 mg/m2 | Freq: Once | INTRAVENOUS | Status: DC
  Filled 2018-09-04: qty 8

## 2018-09-04 MED ORDER — MEPERIDINE HCL 25 MG/ML IJ SOLN
INTRAMUSCULAR | Status: AC
  Filled 2018-09-04: qty 1

## 2018-09-04 MED ORDER — SODIUM CHLORIDE 0.9 % IV SOLN
375.0000 mg/m2 | Freq: Once | INTRAVENOUS | Status: AC
  Administered 2018-09-04: 1000 mg via INTRAVENOUS
  Filled 2018-09-04: qty 100

## 2018-09-04 MED ORDER — PALONOSETRON HCL INJECTION 0.25 MG/5ML: 0.2500 mg | Freq: Once | INTRAVENOUS | Status: DC

## 2018-09-04 MED ORDER — METHYLPREDNISOLONE SODIUM SUCC 125 MG IJ SOLR
80.0000 mg | Freq: Once | INTRAMUSCULAR | Status: AC
  Administered 2018-09-04: 10:00:00 80 mg via INTRAVENOUS

## 2018-09-04 MED ORDER — DEXAMETHASONE SODIUM PHOSPHATE 10 MG/ML IJ SOLN
INTRAMUSCULAR | Status: AC
  Filled 2018-09-04: qty 1

## 2018-09-04 MED ORDER — DIPHENHYDRAMINE HCL 25 MG PO CAPS
50.0000 mg | ORAL_CAPSULE | Freq: Once | ORAL | Status: AC
  Administered 2018-09-04: 09:00:00 50 mg via ORAL

## 2018-09-04 MED ORDER — DEXAMETHASONE SODIUM PHOSPHATE 10 MG/ML IJ SOLN: 10.0000 mg | Freq: Once | INTRAMUSCULAR | Status: DC

## 2018-09-04 MED ORDER — DIPHENHYDRAMINE HCL 25 MG PO CAPS
ORAL_CAPSULE | ORAL | Status: AC
  Filled 2018-09-04: qty 2

## 2018-09-04 MED ORDER — LORAZEPAM 2 MG/ML IJ SOLN
INTRAMUSCULAR | Status: AC
  Filled 2018-09-04: qty 1

## 2018-09-04 MED ORDER — SODIUM CHLORIDE 0.9 % IV SOLN: 10.0000 mg | Freq: Once | INTRAVENOUS | Status: DC

## 2018-09-04 MED ORDER — PALONOSETRON HCL INJECTION 0.25 MG/5ML
INTRAVENOUS | Status: AC
  Filled 2018-09-04: qty 5

## 2018-09-04 MED ORDER — SODIUM CHLORIDE 0.9 % IV SOLN
Freq: Once | INTRAVENOUS | Status: AC
  Administered 2018-09-04: 09:00:00 via INTRAVENOUS
  Filled 2018-09-04: qty 250

## 2018-09-04 MED ORDER — ONDANSETRON HCL 4 MG/2ML IJ SOLN
8.0000 mg | Freq: Once | INTRAMUSCULAR | Status: AC
  Administered 2018-09-04: 8 mg via INTRAVENOUS

## 2018-09-04 NOTE — Progress Notes (Signed)
1000 patient c/o feeling light headed and SOB. rituxan stoopped. NS  Wide open. BP 118/71 P-91. Solumedrol 80 mg given at 1005 1050 patient c/o shaking chills. 02 sat was 90. Oxygen given at 4l/m via nasal canula. Demerol 25 mg given iv push. 1120 Given ativan 0.5 mg IV 1130 given zofran 8 mg IV for n/v  per dr Marin Olp will will not try to challenge rituxan again.

## 2018-09-04 NOTE — Progress Notes (Signed)
Patient feeling much better. Patient only received about 9 cc of Rituxan approximatly.   Patient discharged via wheelchair to home.  Appt in 2 weeks to reevaluate with Dr. Marin Olp treatment plan.

## 2018-09-04 NOTE — Progress Notes (Signed)
Hematology and Oncology Follow Up Visit  David Hubbard [id number] 05-Mar-1940 78 y.o. 09/04/2018   Principle Diagnosis:   Diffuse small lymphocytic lymphoma  Current Therapy:    Rituxan/bendamustine --cycle #1 to start on 09/02/2018     Interim History:  David Hubbard is back for follow-up.  We did do our work-up on him.  Ultimately, the bone marrow biopsy that he had done was important.  This was done on 08/12/2018.  The pathology report ([id number]) showed a hypercellular marrow with involvement of a B-cell lymphoproliferative process.  It was felt this was consistent with small cell lymphocytic lymphoma.  He does have thrombocytopenia.  I think based on the thrombocytopenia, if I have to institute therapy on him.  We did do a CT scan of his body and neck.  This showed adenopathy which was not bulky by any means.  The largest adenopathy was probably in his upper abdomen measuring 2.3 cm.  I do think that he would be a good candidate for therapy.  I forgot to mention that cytogenetics that we sent off on the bone marrow came back normal.  I think that Rituxan/bendamustine would be a good idea for him.  I think the response rate should be 95%.  I think the chance of toxicity would be minimal.  He would not need to have a Port-A-Cath placed.  He has a decent peripheral access.  I talked to he and his daughter for about 45 minutes or so.  I gave him information sheets about the treatment protocol.  I explained the side effects.  I told him that the first time that he gets the Rituxan it will take probably 6-8 hours.  The bendamustine should hopefully take no more than 30 minutes.  I think that we will certainly know how well things are going are by his white cell count and platelet count.  He overall feels well.  He will need allopurinol to help prevent tumor lysis.  He will also need Famvir to help prevent shingles.  He has had no cough.  There is been no change in bowel or bladder habits.   He has had no mouth sores.  He has had no dysphasia or odynophagia.  Overall, his performance status is ECOG 1.  Medications:  Current Outpatient Medications:  .  allopurinol (ZYLOPRIM) 100 MG tablet, TAKE 1 TABLET(100 MG) BY MOUTH DAILY, Disp: 90 tablet, Rfl: 0 .  Ascorbic Acid (VITAMIN C CR) 1000 MG TBCR, Take 1 tablet by mouth every morning. , Disp: , Rfl:  .  aspirin ([employer] ASPIRIN) 81 MG EC tablet, Take 81 mg by mouth daily. Swallow whole., Disp: , Rfl:  .  B Complex Vitamins (VITAMIN B COMPLEX PO), Take 1 tablet by mouth every morning. , Disp: , Rfl:  .  Cholecalciferol (VITAMIN D-3) 5000 UNITS TABS, Take 1 tablet by mouth daily. , Disp: , Rfl:  .  Coenzyme Q10 (CO Q10) 100 MG TABS, Take 1 capsule by mouth every morning. , Disp: , Rfl:  .  Colesevelam HCl (WELCHOL) 3.75 G PACK, Take 1 packet by mouth daily., Disp: , Rfl:  .  Cranberry 250 MG TABS, Take 1 tablet by mouth every morning. , Disp: , Rfl:  .  diphenhydrAMINE (BENADRYL) 25 mg capsule, Take 25 mg by mouth at bedtime as needed for allergies., Disp: , Rfl:  .  Evolocumab (REPATHA SURECLICK) 250 MG/ML SOAJ, INJECT 1ML INTO THE SKIN EVERY 14 DAYS., Disp: , Rfl:  .  famciclovir (FAMVIR)  250 MG tablet, Take 1 tablet (250 mg total) by mouth daily., Disp: 30 tablet, Rfl: 6 .  Grape Seed (RA GRAPE SEED) 100 MG CAPS, Take 1 capsule by mouth every morning. , Disp: , Rfl:  .  loratadine (CLARITIN) 10 MG tablet, Take 10 mg by mouth daily., Disp: , Rfl:  .  metoprolol tartrate (LOPRESSOR) 25 MG tablet, Take 25 mg by mouth 2 (two) times daily. , Disp: , Rfl:  .  Misc Natural Products (PROSTATE HEALTH) CAPS, Take 1 capsule by mouth every morning. , Disp: , Rfl:  .  Multiple Vitamins-Minerals (MENS MULTI VITAMIN & MINERAL PO), Take 1 tablet by mouth every morning. , Disp: , Rfl:  .  Omega-3 Fatty Acids (FISH OIL BURP-LESS) 1200 MG CAPS, Take 2 capsules by mouth 2 (two) times daily. , Disp: , Rfl:  .  ondansetron (ZOFRAN) 8 MG tablet, Take 1 tablet  (8 mg total) by mouth 2 (two) times daily as needed for refractory nausea / vomiting. Start on day 2 after bendamustine chemotherapy., Disp: 30 tablet, Rfl: 1 .  prochlorperazine (COMPAZINE) 10 MG tablet, Take 1 tablet (10 mg total) by mouth every 6 (six) hours as needed (Nausea or vomiting)., Disp: 30 tablet, Rfl: 1 .  Resveratrol 250 MG CAPS, Take 1 capsule by mouth daily., Disp: , Rfl:  .  traZODone (DESYREL) 50 MG tablet, Take 50 mg by mouth at bedtime. , Disp: , Rfl:  .  trimethoprim (TRIMPEX) 100 MG tablet, Take 100 mg by mouth daily. , Disp: , Rfl: 2  Allergies:  Allergies  Allergen Reactions  . Iodinated Diagnostic Agents Nausea And Vomiting    Pt now reports he experiences nausea and vomiting with IV contrast agents after contrast was given tonight    . Other Nausea And Vomiting    Pt now reports he experiences nausea and vomiting with IV contrast agents after contrast was given tonight   . Statins Other (See Comments)    myalgias    Past Medical History, Surgical history, Social history, and Family History were reviewed and updated.  Review of Systems: Review of Systems  Unable to perform ROS: Age  Constitutional: Negative.   HENT:  Negative.   Eyes: Negative.   Respiratory: Negative.   Cardiovascular: Negative.   Gastrointestinal: Negative.   Endocrine: Negative.   Genitourinary: Negative.    Musculoskeletal: Negative.   Skin: Negative.   Neurological: Negative.   Hematological: Negative.   Psychiatric/Behavioral: Negative.     Physical Exam:  weight is 286 lb (129.7 kg). His oral temperature is 98.1 F (36.7 C). His blood pressure is 156/74 (abnormal) and his pulse is 66. His respiration is 20 and oxygen saturation is 94%.   Wt Readings from Last 3 Encounters:  09/04/18 286 lb (129.7 kg)  08/25/18 285 lb (129.3 kg)  08/12/18 187 lb (84.8 kg)    Physical Exam Vitals signs reviewed.  HENT:     Head: Normocephalic and atraumatic.  Eyes:     Pupils: Pupils  are equal, round, and reactive to light.  Neck:     Musculoskeletal: Normal range of motion.  Cardiovascular:     Rate and Rhythm: Normal rate and regular rhythm.     Heart sounds: Normal heart sounds.  Pulmonary:     Effort: Pulmonary effort is normal.     Breath sounds: Normal breath sounds.  Abdominal:     General: Bowel sounds are normal.     Palpations: Abdomen is soft.  Musculoskeletal: Normal  range of motion.        General: No tenderness or deformity.  Lymphadenopathy:     Cervical: No cervical adenopathy.  Skin:    General: Skin is warm and dry.     Findings: No erythema or rash.  Neurological:     Mental Status: He is alert and oriented to person, place, and time.  Psychiatric:        Behavior: Behavior normal.        Thought Content: Thought content normal.        Judgment: Judgment normal.      Lab Results  Component Value Date   WBC 26.8 (H) 09/04/2018   HGB 16.1 09/04/2018   HCT 49.6 09/04/2018   MCV 94.7 09/04/2018   PLT 73 (L) 09/04/2018     Chemistry      Component Value Date/Time   NA 139 09/04/2018 0748   K 4.3 09/04/2018 0748   CL 106 09/04/2018 0748   CO2 24 09/04/2018 0748   BUN 20 09/04/2018 0748   CREATININE 1.20 09/04/2018 0748      Component Value Date/Time   CALCIUM 9.1 09/04/2018 0748   ALKPHOS 78 09/04/2018 0748   AST 13 (L) 09/04/2018 0748   ALT 12 09/04/2018 0748   BILITOT 0.6 09/04/2018 0748       Impression and Plan: David Hubbard is a 78 year old white male.  He has progressive diffuse small B-cell lymphocytic lymphoma.  I think that we would go ahead with treatment on him.  I think Rituxan/bendamustine would be a very reasonable option.  We will be able to I think treat him in 4 cycles and then we assess him.  I will plan for a follow-up CT scan after his second cycle of treatment.  I answered all their questions.  I reviewed the bone marrow biopsy with them.  I reviewed the CT scan with them.  We will go ahead and get  started with treatment next week.  I really think that the risk of tumor lysis will be quite low.  However, I still think allopurinol would not be a bad idea.  I will plan to see him back when he starts his second cycle of treatment.  This will be in September.   [name], MD 8/27/20208:38 AM

## 2018-09-04 NOTE — Progress Notes (Signed)
Okay to treat with pltc = 73 per Dr. Marin Olp

## 2018-09-04 NOTE — Telephone Encounter (Signed)
Message received from pt.'s daughter, Shirlean Mylar requesting a call back.  Call placed back to patient's daughter and she is concerned regarding pt.'s increased confusion.  She states that pt.'s girlfriend stated that pt was slightly more disoriented this AM prior to arrival to this office and is much more confused this afternoon after treatment.  Informed pt.'s daughter that pt did have medications, (Benadryl and Demerol) in the office today which could possibly be the reason of pt.'s confusion.  Instructed pt.'s daughter to call office back tomorrow if pt.'s confusion has not improved.  Pt.'s daughter appreciative of call back.

## 2018-09-04 NOTE — Telephone Encounter (Signed)
Call placed to patient's girlfriend Vivien Rota to inform her that patient does not need to come in for infusion appt tomorrow per Dr. Marin Olp.  Instructed Vivien Rota to call this office back tomorrow if patient's confusion does not improve. Vivien Rota is appreciative of call and has no questions or concerns at this time.

## 2018-09-04 NOTE — Patient Instructions (Signed)
[facility] Discharge Instructions for Patients Receiving Chemotherapy  Today you received the following chemotherapy agents Rituxan, Bendeka  To help prevent nausea and vomiting after your treatment, we encourage you to take your nausea medication   1) Tonight, if you experience nausea, you can take Prochlorperazine (Compazine) by mouth    If you develop nausea and vomiting that is not controlled by your nausea medication, call the clinic.   BELOW ARE SYMPTOMS THAT SHOULD BE REPORTED IMMEDIATELY:  *FEVER GREATER THAN 100.5 F  *CHILLS WITH OR WITHOUT FEVER  NAUSEA AND VOMITING THAT IS NOT CONTROLLED WITH YOUR NAUSEA MEDICATION  *UNUSUAL SHORTNESS OF BREATH  *UNUSUAL BRUISING OR BLEEDING  TENDERNESS IN MOUTH AND THROAT WITH OR WITHOUT PRESENCE OF ULCERS  *URINARY PROBLEMS  *BOWEL PROBLEMS  UNUSUAL RASH Items with * indicate a potential emergency and should be followed up as soon as possible.  Feel free to call the clinic should you have any questions or concerns. The clinic phone number is [phone].  Please show the [facility] at check-in to the Emergency Department and triage nurse.

## 2018-09-05 ENCOUNTER — Inpatient Hospital Stay: Payer: Medicare HMO

## 2018-09-05 LAB — BETA 2 MICROGLOBULIN, SERUM: Beta-2 Microglobulin: 3.1 mg/L — ABNORMAL HIGH (ref 0.6–2.4)

## 2018-09-09 ENCOUNTER — Other Ambulatory Visit: Payer: Self-pay | Admitting: Hematology & Oncology

## 2018-09-28 ENCOUNTER — Other Ambulatory Visit: Payer: Self-pay | Admitting: Hematology & Oncology

## 2018-09-30 ENCOUNTER — Telehealth: Payer: Self-pay | Admitting: *Deleted

## 2018-09-30 NOTE — Telephone Encounter (Signed)
Call received from patient's daughter Shirlean Mylar stating that pt does not have transportation home after his appts this Thursday and is wondering if this office can assist with transportation home. Pt.'s daughter notified that we will set up transportation for pt to get home on Thursday and that someone from transportation will contact her regarding transportation.   Pt.'s daughter appreciative of assistance and has no further questions or concerns at this time.

## 2018-10-01 ENCOUNTER — Ambulatory Visit: Payer: Medicare HMO | Admitting: Hematology & Oncology

## 2018-10-02 ENCOUNTER — Inpatient Hospital Stay: Payer: Medicare HMO

## 2018-10-02 ENCOUNTER — Encounter: Payer: Self-pay | Admitting: Hematology & Oncology

## 2018-10-02 ENCOUNTER — Other Ambulatory Visit: Payer: Self-pay

## 2018-10-02 ENCOUNTER — Inpatient Hospital Stay (HOSPITAL_BASED_OUTPATIENT_CLINIC_OR_DEPARTMENT_OTHER): Payer: Medicare HMO | Admitting: Hematology & Oncology

## 2018-10-02 ENCOUNTER — Inpatient Hospital Stay: Payer: Medicare HMO | Attending: Hematology & Oncology

## 2018-10-02 VITALS — BP 148/73 | HR 61 | Temp 98.7°F | Resp 18 | Wt 289.0 lb

## 2018-10-02 DIAGNOSIS — Z79899 Other long term (current) drug therapy: Secondary | ICD-10-CM | POA: Diagnosis not present

## 2018-10-02 DIAGNOSIS — C8308 Small cell B-cell lymphoma, lymph nodes of multiple sites: Secondary | ICD-10-CM | POA: Diagnosis not present

## 2018-10-02 DIAGNOSIS — Z5111 Encounter for antineoplastic chemotherapy: Secondary | ICD-10-CM | POA: Insufficient documentation

## 2018-10-02 DIAGNOSIS — C8590 Non-Hodgkin lymphoma, unspecified, unspecified site: Secondary | ICD-10-CM | POA: Insufficient documentation

## 2018-10-02 LAB — CBC WITH DIFFERENTIAL (CANCER CENTER ONLY)
Abs Immature Granulocytes: 0.06 10*3/uL (ref 0.00–0.07)
Basophils Absolute: 0.1 10*3/uL (ref 0.0–0.1)
Basophils Relative: 0 %
Eosinophils Absolute: 0.2 10*3/uL (ref 0.0–0.5)
Eosinophils Relative: 1 %
HCT: 47.7 % (ref 39.0–52.0)
Hemoglobin: 15.6 g/dL (ref 13.0–17.0)
Immature Granulocytes: 0 %
Lymphocytes Relative: 83 %
Lymphs Abs: 22.1 10*3/uL — ABNORMAL HIGH (ref 0.7–4.0)
MCH: 30.6 pg (ref 26.0–34.0)
MCHC: 32.7 g/dL (ref 30.0–36.0)
MCV: 93.5 fL (ref 80.0–100.0)
Monocytes Absolute: 0.8 10*3/uL (ref 0.1–1.0)
Monocytes Relative: 3 %
Neutro Abs: 3.5 10*3/uL (ref 1.7–7.7)
Neutrophils Relative %: 13 %
Platelet Count: 75 10*3/uL — ABNORMAL LOW (ref 150–400)
RBC: 5.1 MIL/uL (ref 4.22–5.81)
RDW: 13.9 % (ref 11.5–15.5)
WBC Count: 26.8 10*3/uL — ABNORMAL HIGH (ref 4.0–10.5)
nRBC: 0 % (ref 0.0–0.2)

## 2018-10-02 LAB — CMP (CANCER CENTER ONLY)
ALT: 10 U/L (ref 0–44)
AST: 12 U/L — ABNORMAL LOW (ref 15–41)
Albumin: 4.1 g/dL (ref 3.5–5.0)
Alkaline Phosphatase: 79 U/L (ref 38–126)
Anion gap: 9 (ref 5–15)
BUN: 19 mg/dL (ref 8–23)
CO2: 26 mmol/L (ref 22–32)
Calcium: 9.4 mg/dL (ref 8.9–10.3)
Chloride: 104 mmol/L (ref 98–111)
Creatinine: 1.26 mg/dL — ABNORMAL HIGH (ref 0.61–1.24)
GFR, Est AFR Am: >60 mL/min (ref >60)
GFR, Estimated: 55 mL/min — ABNORMAL LOW (ref >60)
Glucose, Bld: 103 mg/dL — ABNORMAL HIGH (ref 70–99)
Potassium: 4.3 mmol/L (ref 3.5–5.1)
Sodium: 139 mmol/L (ref 135–145)
Total Bilirubin: 0.5 mg/dL (ref 0.3–1.2)
Total Protein: 6.5 g/dL (ref 6.5–8.1)

## 2018-10-02 LAB — LACTATE DEHYDROGENASE: LDH: 146 U/L (ref 98–192)

## 2018-10-02 MED ORDER — SODIUM CHLORIDE 0.9 % IV SOLN: 81.0000 mg/m2 | Freq: Once | INTRAVENOUS | Status: DC

## 2018-10-02 MED ORDER — SODIUM CHLORIDE 0.9 % IV SOLN
Freq: Once | INTRAVENOUS | Status: AC
  Administered 2018-10-02: 09:00:00 via INTRAVENOUS
  Filled 2018-10-02: qty 250

## 2018-10-02 MED ORDER — SODIUM CHLORIDE 0.9 % IV SOLN: 10.0000 mg | Freq: Once | INTRAVENOUS | Status: DC

## 2018-10-02 MED ORDER — DEXAMETHASONE SODIUM PHOSPHATE 10 MG/ML IJ SOLN
10.0000 mg | Freq: Once | INTRAMUSCULAR | Status: AC
  Administered 2018-10-02: 10 mg via INTRAVENOUS

## 2018-10-02 MED ORDER — SODIUM CHLORIDE 0.9 % IV SOLN
Freq: Once | INTRAVENOUS | Status: DC
  Filled 2018-10-02: qty 250

## 2018-10-02 MED ORDER — DEXAMETHASONE SODIUM PHOSPHATE 10 MG/ML IJ SOLN
INTRAMUSCULAR | Status: AC
  Filled 2018-10-02: qty 1

## 2018-10-02 MED ORDER — PALONOSETRON HCL INJECTION 0.25 MG/5ML
0.2500 mg | Freq: Once | INTRAVENOUS | Status: AC
  Administered 2018-10-02: 09:00:00 0.25 mg via INTRAVENOUS

## 2018-10-02 MED ORDER — SODIUM CHLORIDE 0.9 % IV SOLN
81.0000 mg/m2 | Freq: Once | INTRAVENOUS | Status: AC
  Administered 2018-10-02: 200 mg via INTRAVENOUS
  Filled 2018-10-02: qty 8

## 2018-10-02 MED ORDER — PALONOSETRON HCL INJECTION 0.25 MG/5ML
INTRAVENOUS | Status: AC
  Filled 2018-10-02: qty 5

## 2018-10-02 NOTE — Progress Notes (Signed)
Ok to treat with labs and PLT count today per MD Ennever. NO Rituxan, just [employer]

## 2018-10-02 NOTE — Progress Notes (Signed)
Hematology and Oncology Follow Up Visit  David Hubbard CL:[id number] [birth year] 78 y.o. 10/02/2018   Principle Diagnosis:   Diffuse small lymphocytic lymphoma  Current Therapy:    Bendamustine --cycle #1 to start on 10/02/2018     Interim History:  David Hubbard is back for follow-up.  Unfortunately, he really had a bad time with the Rituxan.  We cannot give him the Rituxan.  He just had a lot of toxicity with it.  I think that we can probably just go with single agent bendamustine for his lymphoma.  He has a low-grade lymphoma.  I think that bendamustine by itself should be able to do the job for [place].  He has had no complaints.  He has had no nausea or vomiting.  He does have a lot of perspiration.  This is happened chronically for him.  He has had no cough.  There is been no change in bowel or bladder habits.  He has had no nausea or vomiting.  Overall, I would have to say that his performance status is ECOG 1. .  Medications:  Current Outpatient Medications:  .  allopurinol (ZYLOPRIM) 100 MG tablet, TAKE 1 TABLET(100 MG) BY MOUTH DAILY, Disp: 90 tablet, Rfl: 0 .  Ascorbic Acid (VITAMIN C CR) 1000 MG TBCR, Take 1 tablet by mouth every morning. , Disp: , Rfl:  .  aspirin ([employer] ASPIRIN) 81 MG EC tablet, Take 81 mg by mouth daily. Swallow whole., Disp: , Rfl:  .  B Complex Vitamins (VITAMIN B COMPLEX PO), Take 1 tablet by mouth every morning. , Disp: , Rfl:  .  Cholecalciferol (VITAMIN D-3) 5000 UNITS TABS, Take 1 tablet by mouth daily. , Disp: , Rfl:  .  Coenzyme Q10 (CO Q10) 100 MG TABS, Take 1 capsule by mouth every morning. , Disp: , Rfl:  .  Colesevelam HCl (WELCHOL) 3.75 G PACK, Take 1 packet by mouth daily., Disp: , Rfl:  .  Cranberry 250 MG TABS, Take 1 tablet by mouth every morning. , Disp: , Rfl:  .  diphenhydrAMINE (BENADRYL) 25 mg capsule, Take 25 mg by mouth at bedtime as needed for allergies., Disp: , Rfl:  .  Evolocumab (REPATHA SURECLICK) XX123456 MG/ML SOAJ, INJECT 1ML INTO THE  SKIN EVERY 14 DAYS., Disp: , Rfl:  .  famciclovir (FAMVIR) 250 MG tablet, Take 1 tablet (250 mg total) by mouth daily., Disp: 30 tablet, Rfl: 6 .  Grape Seed (RA GRAPE SEED) 100 MG CAPS, Take 1 capsule by mouth every morning. , Disp: , Rfl:  .  loratadine (CLARITIN) 10 MG tablet, Take 10 mg by mouth daily., Disp: , Rfl:  .  metoprolol tartrate (LOPRESSOR) 25 MG tablet, Take 25 mg by mouth 2 (two) times daily. , Disp: , Rfl:  .  Misc Natural Products (PROSTATE HEALTH) CAPS, Take 1 capsule by mouth every morning. , Disp: , Rfl:  .  Multiple Vitamins-Minerals (MENS MULTI VITAMIN & MINERAL PO), Take 1 tablet by mouth every morning. , Disp: , Rfl:  .  Omega-3 Fatty Acids (FISH OIL BURP-LESS) 1200 MG CAPS, Take 2 capsules by mouth 2 (two) times daily. , Disp: , Rfl:  .  ondansetron (ZOFRAN) 8 MG tablet, Take 1 tablet (8 mg total) by mouth 2 (two) times daily as needed for refractory nausea / vomiting. Start on day 2 after bendamustine chemotherapy., Disp: 30 tablet, Rfl: 1 .  prochlorperazine (COMPAZINE) 10 MG tablet, Take 1 tablet (10 mg total) by mouth every 6 (six)  hours as needed (Nausea or vomiting)., Disp: 30 tablet, Rfl: 1 .  Resveratrol 250 MG CAPS, Take 1 capsule by mouth daily., Disp: , Rfl:  .  traZODone (DESYREL) 50 MG tablet, Take 50 mg by mouth at bedtime. , Disp: , Rfl:  .  trimethoprim (TRIMPEX) 100 MG tablet, Take 100 mg by mouth daily. , Disp: , Rfl: 2 No current facility-administered medications for this visit.   Facility-Administered Medications Ordered in Other Visits:  .  0.9 %  sodium chloride infusion, , Intravenous, Once, Ennever, Rudell Cobb, MD .  bendamustine [facility]) 200 mg in sodium chloride 0.9 % 50 mL (3.4483 mg/mL) chemo infusion, 81 mg/m2 (Treatment Plan Recorded), Intravenous, Once, Ennever, Rudell Cobb, MD  Allergies:  Allergies  Allergen Reactions  . Iodinated Diagnostic Agents Nausea And Vomiting    Pt now reports he experiences nausea and vomiting with IV contrast  agents after contrast was given tonight    . Other Nausea And Vomiting    Pt now reports he experiences nausea and vomiting with IV contrast agents after contrast was given tonight   . Statins Other (See Comments)    myalgias    Past Medical History, Surgical history, Social history, and Family History were reviewed and updated.  Review of Systems: Review of Systems  Unable to perform ROS: Age  Constitutional: Negative.   HENT:  Negative.   Eyes: Negative.   Respiratory: Negative.   Cardiovascular: Negative.   Gastrointestinal: Negative.   Endocrine: Negative.   Genitourinary: Negative.    Musculoskeletal: Negative.   Skin: Negative.   Neurological: Negative.   Hematological: Negative.   Psychiatric/Behavioral: Negative.     Physical Exam:  weight is 289 lb (131.1 kg). His temporal temperature is 98.7 F (37.1 C). His blood pressure is 148/73 (abnormal) and his pulse is 61. His respiration is 18 and oxygen saturation is 93%.   Wt Readings from Last 3 Encounters:  10/02/18 289 lb (131.1 kg)  09/04/18 286 lb (129.7 kg)  08/25/18 285 lb (129.3 kg)    Physical Exam Vitals signs reviewed.  HENT:     Head: Normocephalic and atraumatic.  Eyes:     Pupils: Pupils are equal, round, and reactive to light.  Neck:     Musculoskeletal: Normal range of motion.  Cardiovascular:     Rate and Rhythm: Normal rate and regular rhythm.     Heart sounds: Normal heart sounds.  Pulmonary:     Effort: Pulmonary effort is normal.     Breath sounds: Normal breath sounds.  Abdominal:     General: Bowel sounds are normal.     Palpations: Abdomen is soft.  Musculoskeletal: Normal range of motion.        General: No tenderness or deformity.  Lymphadenopathy:     Cervical: No cervical adenopathy.  Skin:    General: Skin is warm and dry.     Findings: No erythema or rash.  Neurological:     Mental Status: He is alert and oriented to person, place, and time.  Psychiatric:         Behavior: Behavior normal.        Thought Content: Thought content normal.        Judgment: Judgment normal.      Lab Results  Component Value Date   WBC 26.8 (H) 10/02/2018   HGB 15.6 10/02/2018   HCT 47.7 10/02/2018   MCV 93.5 10/02/2018   PLT 75 (L) 10/02/2018     Chemistry  Component Value Date/Time   NA 139 10/02/2018 0759   K 4.3 10/02/2018 0759   CL 104 10/02/2018 0759   CO2 26 10/02/2018 0759   BUN 19 10/02/2018 0759   CREATININE 1.26 (H) 10/02/2018 0759      Component Value Date/Time   CALCIUM 9.4 10/02/2018 0759   ALKPHOS 79 10/02/2018 0759   AST 12 (L) 10/02/2018 0759   ALT 10 10/02/2018 0759   BILITOT 0.5 10/02/2018 0759       Impression and Plan: David Hubbard is a 78 year old white male.  He has progressive diffuse small B-cell lymphocytic lymphoma.  We will have to see how he does with the single agent Rituxan.  If we do not see that he is responding adequately, then we will make a change to something different.  I will see him back in 1 month.  We should see that his white cell count should be down nicely when we see him back.  We are dealing with quality of life for David Hubbard.  This is what he wants to focus on.     [name], MD 9/24/20209:19 AM

## 2018-10-03 ENCOUNTER — Other Ambulatory Visit: Payer: Self-pay | Admitting: *Deleted

## 2018-10-03 ENCOUNTER — Inpatient Hospital Stay: Payer: Medicare HMO

## 2018-10-03 VITALS — BP 123/54 | HR 71 | Temp 97.5°F | Resp 20

## 2018-10-03 DIAGNOSIS — Z5111 Encounter for antineoplastic chemotherapy: Secondary | ICD-10-CM | POA: Diagnosis not present

## 2018-10-03 DIAGNOSIS — C8308 Small cell B-cell lymphoma, lymph nodes of multiple sites: Secondary | ICD-10-CM

## 2018-10-03 LAB — IGG, IGA, IGM
IgA: 69 mg/dL (ref 61–437)
IgG (Immunoglobin G), Serum: 517 mg/dL — ABNORMAL LOW (ref 603–1613)
IgM (Immunoglobulin M), Srm: 15 mg/dL (ref 15–143)

## 2018-10-03 MED ORDER — ONDANSETRON HCL 8 MG PO TABS: 8.0000 mg | ORAL_TABLET | Freq: Two times a day (BID) | ORAL | 1 refills | Status: DC | PRN

## 2018-10-03 MED ORDER — DEXAMETHASONE SODIUM PHOSPHATE 10 MG/ML IJ SOLN
10.0000 mg | Freq: Once | INTRAMUSCULAR | Status: AC
  Administered 2018-10-03: 10 mg via INTRAVENOUS

## 2018-10-03 MED ORDER — SODIUM CHLORIDE 0.9 % IV SOLN
81.0000 mg/m2 | Freq: Once | INTRAVENOUS | Status: AC
  Administered 2018-10-03: 200 mg via INTRAVENOUS
  Filled 2018-10-03: qty 8

## 2018-10-03 MED ORDER — SODIUM CHLORIDE 0.9 % IV SOLN
Freq: Once | INTRAVENOUS | Status: AC
  Administered 2018-10-03: 13:00:00 via INTRAVENOUS
  Filled 2018-10-03: qty 250

## 2018-10-03 MED ORDER — DEXAMETHASONE SODIUM PHOSPHATE 10 MG/ML IJ SOLN
INTRAMUSCULAR | Status: AC
  Filled 2018-10-03: qty 1

## 2018-10-03 NOTE — Patient Instructions (Signed)
Bendamustine Injection What is this medicine? BENDAMUSTINE (BEN da MUS teen) is a chemotherapy drug. It is used to treat chronic lymphocytic leukemia and non-Hodgkin lymphoma. This medicine may be used for other purposes; ask your health care provider or pharmacist if you have questions. COMMON BRAND NAME(S): BELRAPZO, BENDEKA, Treanda What should I tell my health care provider before I take this medicine? They need to know if you have any of these conditions:  infection (especially a virus infection such as chickenpox, cold sores, or herpes)  kidney disease  liver disease  an unusual or allergic reaction to bendamustine, mannitol, other medicines, foods, dyes, or preservatives  pregnant or trying to get pregnant  breast-feeding How should I use this medicine? This medicine is for infusion into a vein. It is given by a health care professional in a hospital or clinic setting. Talk to your pediatrician regarding the use of this medicine in children. Special care may be needed. Overdosage: If you think you have taken too much of this medicine contact a poison control center or emergency room at once. NOTE: This medicine is only for you. Do not share this medicine with others. What if I miss a dose? It is important not to miss your dose. Call your doctor or health care professional if you are unable to keep an appointment. What may interact with this medicine? Do not take this medicine with any of the following medications:  clozapine This medicine may also interact with the following medications:  atazanavir  cimetidine  ciprofloxacin  enoxacin  fluvoxamine  medicines for seizures like carbamazepine and phenobarbital  mexiletine  rifampin  tacrine  thiabendazole  zileuton This list may not describe all possible interactions. Give your health care provider a list of all the medicines, herbs, non-prescription drugs, or dietary supplements you use. Also tell them if  you smoke, drink alcohol, or use illegal drugs. Some items may interact with your medicine. What should I watch for while using this medicine? This drug may make you feel generally unwell. This is not uncommon, as chemotherapy can affect healthy cells as well as cancer cells. Report any side effects. Continue your course of treatment even though you feel ill unless your doctor tells you to stop. You may need blood work done while you are taking this medicine. Call your doctor or healthcare provider for advice if you get a fever, chills or sore throat, or other symptoms of a cold or flu. Do not treat yourself. This drug decreases your body's ability to fight infections. Try to avoid being around people who are sick. This medicine may cause serious skin reactions. They can happen weeks to months after starting the medicine. Contact your healthcare provider right away if you notice fevers or flu-like symptoms with a rash. The rash may be red or purple and then turn into blisters or peeling of the skin. Or, you might notice a red rash with swelling of the face, lips or lymph nodes in your neck or under your arms. This medicine may increase your risk to bruise or bleed. Call your doctor or healthcare provider if you notice any unusual bleeding. Talk to your doctor about your risk of cancer. You may be more at risk for certain types of cancers if you take this medicine. Do not become pregnant while taking this medicine or for at least 6 months after stopping it. Women should inform their doctor if they wish to become pregnant or think they might be pregnant. Men should not   father a child while taking this medicine and for at least 3 months after stopping it. There is a potential for serious side effects to an unborn child. Talk to your healthcare provider or pharmacist for more information. Do not breast-feed an infant while taking this medicine or for at least 1 week after stopping it. This medicine may make it  more difficult to father a child. You should talk with your doctor or healthcare provider if you are concerned about your fertility. What side effects may I notice from receiving this medicine? Side effects that you should report to your doctor or health care professional as soon as possible:  allergic reactions like skin rash, itching or hives, swelling of the face, lips, or tongue  low blood counts - this medicine may decrease the number of white blood cells, red blood cells and platelets. You may be at increased risk for infections and bleeding.  rash, fever, and swollen lymph nodes  redness, blistering, peeling, or loosening of the skin, including inside the mouth  signs of infection like fever or chills, cough, sore throat, pain or difficulty passing urine  signs of decreased platelets or bleeding like bruising, pinpoint red spots on the skin, black, tarry stools, blood in the urine  signs of decreased red blood cells like being unusually weak or tired, fainting spells, lightheadedness  signs and symptoms of kidney injury like trouble passing urine or change in the amount of urine  signs and symptoms of liver injury like dark yellow or brown urine; general ill feeling or flu-like symptoms; light-colored stools; loss of appetite; nausea; right upper belly pain; unusually weak or tired; yellowing of the eyes or skin Side effects that usually do not require medical attention (report to your doctor or health care professional if they continue or are bothersome):  constipation  decreased appetite  diarrhea  headache  mouth sores  nausea, vomiting  tiredness This list may not describe all possible side effects. Call your doctor for medical advice about side effects. You may report side effects to FDA at 1-800-FDA-1088. Where should I keep my medicine? This drug is given in a hospital or clinic and will not be stored at home. NOTE: This sheet is a summary. It may not cover all  possible information. If you have questions about this medicine, talk to your doctor, pharmacist, or health care provider.  2020 Elsevier/Gold Standard (2018-03-18 10:26:46)  

## 2018-10-07 LAB — PROTEIN ELECTROPHORESIS, SERUM, WITH REFLEX
A/G Ratio: 1.6 (ref 0.7–1.7)
Albumin ELP: 3.8 g/dL (ref 2.9–4.4)
Alpha-1-Globulin: 0.2 g/dL (ref 0.0–0.4)
Alpha-2-Globulin: 0.9 g/dL (ref 0.4–1.0)
Beta Globulin: 0.9 g/dL (ref 0.7–1.3)
Gamma Globulin: 0.4 g/dL (ref 0.4–1.8)
Globulin, Total: 2.4 g/dL (ref 2.2–3.9)
M-Spike, %: 0.1 g/dL — ABNORMAL HIGH
SPEP Interpretation: 0
Total Protein ELP: 6.2 g/dL (ref 6.0–8.5)

## 2018-10-07 LAB — IMMUNOFIXATION REFLEX, SERUM
IgA: 76 mg/dL (ref 61–437)
IgG (Immunoglobin G), Serum: 541 mg/dL — ABNORMAL LOW (ref 603–1613)
IgM (Immunoglobulin M), Srm: 17 mg/dL (ref 15–143)

## 2018-10-09 ENCOUNTER — Telehealth: Payer: Self-pay | Admitting: *Deleted

## 2018-10-09 NOTE — Telephone Encounter (Addendum)
Received call from patients daughter stating that since chemotherapy, patients significant other has said he has been going to the bathroom a lot and witnessed about 20 pair of underwear that was "messy" in the close hamper.  Clarified that patient having diarrhea.  Told Robin to have patient start taking Imodium as soon as possible and reviewed instructions.  She stated that patient is drinking ok.  Not eating as much.  She is going to get him outside today and keep me posted with progress.  Above shared with Dr. Marin Olp. Said patient could add Pepto Bismol as well.

## 2018-10-30 ENCOUNTER — Other Ambulatory Visit: Payer: Medicare HMO

## 2018-10-30 ENCOUNTER — Ambulatory Visit: Payer: Medicare HMO

## 2018-10-30 ENCOUNTER — Ambulatory Visit: Payer: Medicare HMO | Admitting: Family

## 2018-10-31 ENCOUNTER — Ambulatory Visit: Payer: Medicare HMO

## 2018-11-06 ENCOUNTER — Inpatient Hospital Stay (HOSPITAL_BASED_OUTPATIENT_CLINIC_OR_DEPARTMENT_OTHER): Payer: Medicare HMO | Admitting: Hematology & Oncology

## 2018-11-06 ENCOUNTER — Encounter: Payer: Self-pay | Admitting: Hematology & Oncology

## 2018-11-06 ENCOUNTER — Inpatient Hospital Stay: Payer: Medicare HMO

## 2018-11-06 ENCOUNTER — Inpatient Hospital Stay: Payer: Medicare HMO | Attending: Hematology & Oncology

## 2018-11-06 ENCOUNTER — Other Ambulatory Visit: Payer: Self-pay

## 2018-11-06 VITALS — BP 143/71 | HR 84 | Temp 96.0°F | Resp 19 | Wt 280.0 lb

## 2018-11-06 DIAGNOSIS — Z5111 Encounter for antineoplastic chemotherapy: Secondary | ICD-10-CM | POA: Diagnosis not present

## 2018-11-06 DIAGNOSIS — C8308 Small cell B-cell lymphoma, lymph nodes of multiple sites: Secondary | ICD-10-CM

## 2018-11-06 DIAGNOSIS — Z79899 Other long term (current) drug therapy: Secondary | ICD-10-CM | POA: Insufficient documentation

## 2018-11-06 DIAGNOSIS — Z7982 Long term (current) use of aspirin: Secondary | ICD-10-CM | POA: Insufficient documentation

## 2018-11-06 LAB — CBC WITH DIFFERENTIAL (CANCER CENTER ONLY)
Abs Immature Granulocytes: 0.01 10*3/uL (ref 0.00–0.07)
Basophils Absolute: 0 10*3/uL (ref 0.0–0.1)
Basophils Relative: 1 %
Eosinophils Absolute: 0.2 10*3/uL (ref 0.0–0.5)
Eosinophils Relative: 6 %
HCT: 43.6 % (ref 39.0–52.0)
Hemoglobin: 14.9 g/dL (ref 13.0–17.0)
Immature Granulocytes: 0 %
Lymphocytes Relative: 19 %
Lymphs Abs: 0.7 10*3/uL (ref 0.7–4.0)
MCH: 31.7 pg (ref 26.0–34.0)
MCHC: 34.2 g/dL (ref 30.0–36.0)
MCV: 92.8 fL (ref 80.0–100.0)
Monocytes Absolute: 0.5 10*3/uL (ref 0.1–1.0)
Monocytes Relative: 12 %
Neutro Abs: 2.4 10*3/uL (ref 1.7–7.7)
Neutrophils Relative %: 62 %
Platelet Count: 79 10*3/uL — ABNORMAL LOW (ref 150–400)
RBC: 4.7 MIL/uL (ref 4.22–5.81)
RDW: 13.7 % (ref 11.5–15.5)
WBC Count: 3.8 10*3/uL — ABNORMAL LOW (ref 4.0–10.5)
nRBC: 0 % (ref 0.0–0.2)

## 2018-11-06 LAB — CMP (CANCER CENTER ONLY)
ALT: 10 U/L (ref 0–44)
AST: 12 U/L — ABNORMAL LOW (ref 15–41)
Albumin: 4.3 g/dL (ref 3.5–5.0)
Alkaline Phosphatase: 72 U/L (ref 38–126)
Anion gap: 7 (ref 5–15)
BUN: 19 mg/dL (ref 8–23)
CO2: 26 mmol/L (ref 22–32)
Calcium: 9.7 mg/dL (ref 8.9–10.3)
Chloride: 106 mmol/L (ref 98–111)
Creatinine: 1.23 mg/dL (ref 0.61–1.24)
GFR, Est AFR Am: >60 mL/min (ref >60)
GFR, Estimated: 57 mL/min — ABNORMAL LOW (ref >60)
Glucose, Bld: 95 mg/dL (ref 70–99)
Potassium: 4.5 mmol/L (ref 3.5–5.1)
Sodium: 139 mmol/L (ref 135–145)
Total Bilirubin: 0.5 mg/dL (ref 0.3–1.2)
Total Protein: 6.3 g/dL — ABNORMAL LOW (ref 6.5–8.1)

## 2018-11-06 LAB — LACTATE DEHYDROGENASE: LDH: 113 U/L (ref 98–192)

## 2018-11-06 MED ORDER — SODIUM CHLORIDE 0.9 % IV SOLN
81.0000 mg/m2 | Freq: Once | INTRAVENOUS | Status: AC
  Administered 2018-11-06: 200 mg via INTRAVENOUS
  Filled 2018-11-06: qty 8

## 2018-11-06 MED ORDER — DEXAMETHASONE SODIUM PHOSPHATE 10 MG/ML IJ SOLN
10.0000 mg | Freq: Once | INTRAMUSCULAR | Status: AC
  Administered 2018-11-06: 10 mg via INTRAVENOUS

## 2018-11-06 MED ORDER — SODIUM CHLORIDE 0.9 % IV SOLN
Freq: Once | INTRAVENOUS | Status: AC
  Administered 2018-11-06: 09:00:00 via INTRAVENOUS
  Filled 2018-11-06: qty 250

## 2018-11-06 MED ORDER — PALONOSETRON HCL INJECTION 0.25 MG/5ML
0.2500 mg | Freq: Once | INTRAVENOUS | Status: AC
  Administered 2018-11-06: 0.25 mg via INTRAVENOUS

## 2018-11-06 MED ORDER — DEXAMETHASONE SODIUM PHOSPHATE 10 MG/ML IJ SOLN
INTRAMUSCULAR | Status: AC
  Filled 2018-11-06: qty 1

## 2018-11-06 MED ORDER — PALONOSETRON HCL INJECTION 0.25 MG/5ML
INTRAVENOUS | Status: AC
  Filled 2018-11-06: qty 5

## 2018-11-06 NOTE — Progress Notes (Signed)
Ok to treat with platelets of 79 per Dr Marin Olp. dph

## 2018-11-06 NOTE — Progress Notes (Signed)
Hematology and Oncology Follow Up Visit  David Hubbard CL:[id number] 1940/09/29 78 y.o. 11/06/2018   Principle Diagnosis:   Diffuse small lymphocytic lymphoma  Current Therapy:    Bendamustine -- s/p cycle #2 - start on 10/02/2018     Interim History:  David Hubbard is back for follow-up.  It is clear that the bendamustine by itself is working quite nicely.  His white cell count has come down from [phone].  He feels okay.  He really has had no problems with the bendamustine.  There is been no nausea or vomiting.  He has had no cough.  He has had no change in bowel or bladder habits.  There is been no issues with fever.  He has had no bleeding.  He is not noted any lymphadenopathy.  Overall, his performance status is ECOG 1.  Medications:  Current Outpatient Medications:  .  donepezil (ARICEPT) 5 MG tablet, Take 5 mg by mouth daily., Disp: , Rfl:  .  allopurinol (ZYLOPRIM) 100 MG tablet, TAKE 1 TABLET(100 MG) BY MOUTH DAILY, Disp: 90 tablet, Rfl: 0 .  Ascorbic Acid (VITAMIN C CR) 1000 MG TBCR, Take 1 tablet by mouth every morning. , Disp: , Rfl:  .  aspirin ([employer] ASPIRIN) 81 MG EC tablet, Take 81 mg by mouth daily. Swallow whole., Disp: , Rfl:  .  B Complex Vitamins (VITAMIN B COMPLEX PO), Take 1 tablet by mouth every morning. , Disp: , Rfl:  .  Cholecalciferol (VITAMIN D-3) 5000 UNITS TABS, Take 1 tablet by mouth daily. , Disp: , Rfl:  .  Coenzyme Q10 (CO Q10) 100 MG TABS, Take 1 capsule by mouth every morning. , Disp: , Rfl:  .  Colesevelam HCl (WELCHOL) 3.75 G PACK, Take 1 packet by mouth daily., Disp: , Rfl:  .  Cranberry 250 MG TABS, Take 1 tablet by mouth every morning. , Disp: , Rfl:  .  diphenhydrAMINE (BENADRYL) 25 mg capsule, Take 25 mg by mouth at bedtime as needed for allergies., Disp: , Rfl:  .  Evolocumab (REPATHA SURECLICK) [id number] MG/ML SOAJ, INJECT 1ML INTO THE SKIN EVERY 14 DAYS., Disp: , Rfl:  .  famciclovir (FAMVIR) 250 MG tablet, Take 1 tablet (250 mg total) by mouth  daily., Disp: 30 tablet, Rfl: 6 .  Grape Seed (RA GRAPE SEED) 100 MG CAPS, Take 1 capsule by mouth every morning. , Disp: , Rfl:  .  loratadine (CLARITIN) 10 MG tablet, Take 10 mg by mouth daily., Disp: , Rfl:  .  metoprolol tartrate (LOPRESSOR) 25 MG tablet, Take 25 mg by mouth 2 (two) times daily. , Disp: , Rfl:  .  Misc Natural Products (PROSTATE HEALTH) CAPS, Take 1 capsule by mouth every morning. , Disp: , Rfl:  .  Multiple Vitamins-Minerals (MENS MULTI VITAMIN & MINERAL PO), Take 1 tablet by mouth every morning. , Disp: , Rfl:  .  Omega-3 Fatty Acids (FISH OIL BURP-LESS) 1200 MG CAPS, Take 2 capsules by mouth 2 (two) times daily. , Disp: , Rfl:  .  ondansetron (ZOFRAN) 8 MG tablet, Take 1 tablet (8 mg total) by mouth 2 (two) times daily as needed for refractory nausea / vomiting. Start on day 2 after bendamustine chemotherapy., Disp: 30 tablet, Rfl: 1 .  prochlorperazine (COMPAZINE) 10 MG tablet, Take 1 tablet (10 mg total) by mouth every 6 (six) hours as needed (Nausea or vomiting)., Disp: 30 tablet, Rfl: 1 .  Resveratrol 250 MG CAPS, Take 1 capsule by mouth daily., Disp: ,  Rfl:  .  traZODone (DESYREL) 50 MG tablet, Take 50 mg by mouth at bedtime. , Disp: , Rfl:  .  trimethoprim (TRIMPEX) 100 MG tablet, Take 100 mg by mouth daily. , Disp: , Rfl: 2  Allergies:  Allergies  Allergen Reactions  . Iodinated Diagnostic Agents Nausea And Vomiting    Pt now reports he experiences nausea and vomiting with IV contrast agents after contrast was given tonight    . Other Nausea And Vomiting    Pt now reports he experiences nausea and vomiting with IV contrast agents after contrast was given tonight   . Statins Other (See Comments)    myalgias    Past Medical History, Surgical history, Social history, and Family History were reviewed and updated.  Review of Systems: Review of Systems  Unable to perform ROS: Age  Constitutional: Negative.   HENT:  Negative.   Eyes: Negative.   Respiratory:  Negative.   Cardiovascular: Negative.   Gastrointestinal: Negative.   Endocrine: Negative.   Genitourinary: Negative.    Musculoskeletal: Negative.   Skin: Negative.   Neurological: Negative.   Hematological: Negative.   Psychiatric/Behavioral: Negative.     Physical Exam:  weight is 280 lb (127 kg). His temporal temperature is 96 F (35.6 C) (abnormal). His blood pressure is 143/71 (abnormal) and his pulse is 84. His respiration is 19 and oxygen saturation is 94%.   Wt Readings from Last 3 Encounters:  11/06/18 280 lb (127 kg)  10/02/18 289 lb (131.1 kg)  09/04/18 286 lb (129.7 kg)    Physical Exam Vitals signs reviewed.  HENT:     Head: Normocephalic and atraumatic.  Eyes:     Pupils: Pupils are equal, round, and reactive to light.  Neck:     Musculoskeletal: Normal range of motion.  Cardiovascular:     Rate and Rhythm: Normal rate and regular rhythm.     Heart sounds: Normal heart sounds.  Pulmonary:     Effort: Pulmonary effort is normal.     Breath sounds: Normal breath sounds.  Abdominal:     General: Bowel sounds are normal.     Palpations: Abdomen is soft.  Musculoskeletal: Normal range of motion.        General: No tenderness or deformity.  Lymphadenopathy:     Cervical: No cervical adenopathy.  Skin:    General: Skin is warm and dry.     Findings: No erythema or rash.  Neurological:     Mental Status: He is alert and oriented to person, place, and time.  Psychiatric:        Behavior: Behavior normal.        Thought Content: Thought content normal.        Judgment: Judgment normal.      Lab Results  Component Value Date   WBC 3.8 (L) 11/06/2018   HGB 14.9 11/06/2018   HCT 43.6 11/06/2018   MCV 92.8 11/06/2018   PLT 79 (L) 11/06/2018     Chemistry      Component Value Date/Time   NA 139 10/02/2018 0759   K 4.3 10/02/2018 0759   CL 104 10/02/2018 0759   CO2 26 10/02/2018 0759   BUN 19 10/02/2018 0759   CREATININE 1.26 (H) 10/02/2018 0759       Component Value Date/Time   CALCIUM 9.4 10/02/2018 0759   ALKPHOS 79 10/02/2018 0759   AST 12 (L) 10/02/2018 0759   ALT 10 10/02/2018 0759   BILITOT 0.5 10/02/2018 0759  Impression and Plan: David Hubbard is a 78 year old white male.  He has progressive diffuse small B-cell lymphocytic lymphoma.  We will continue him on the bendamustine by itself.  Again, his response has been quite good.  I will see what we can do to try to make sure that we do not treat him before Thanksgiving with his next cycle.  This will be as second cycle of chemotherapy with the bendamustine.  I am just happy that his quality of life is doing okay.    [name], MD 10/29/20208:25 AM

## 2018-11-06 NOTE — Patient Instructions (Signed)
Bendamustine Injection What is this medicine? BENDAMUSTINE (BEN da MUS teen) is a chemotherapy drug. It is used to treat chronic lymphocytic leukemia and non-Hodgkin lymphoma. This medicine may be used for other purposes; ask your health care provider or pharmacist if you have questions. COMMON BRAND NAME(S): BELRAPZO, BENDEKA, Treanda What should I tell my health care provider before I take this medicine? They need to know if you have any of these conditions:  infection (especially a virus infection such as chickenpox, cold sores, or herpes)  kidney disease  liver disease  an unusual or allergic reaction to bendamustine, mannitol, other medicines, foods, dyes, or preservatives  pregnant or trying to get pregnant  breast-feeding How should I use this medicine? This medicine is for infusion into a vein. It is given by a health care professional in a hospital or clinic setting. Talk to your pediatrician regarding the use of this medicine in children. Special care may be needed. Overdosage: If you think you have taken too much of this medicine contact a poison control center or emergency room at once. NOTE: This medicine is only for you. Do not share this medicine with others. What if I miss a dose? It is important not to miss your dose. Call your doctor or health care professional if you are unable to keep an appointment. What may interact with this medicine? Do not take this medicine with any of the following medications:  clozapine This medicine may also interact with the following medications:  atazanavir  cimetidine  ciprofloxacin  enoxacin  fluvoxamine  medicines for seizures like carbamazepine and phenobarbital  mexiletine  rifampin  tacrine  thiabendazole  zileuton This list may not describe all possible interactions. Give your health care provider a list of all the medicines, herbs, non-prescription drugs, or dietary supplements you use. Also tell them if  you smoke, drink alcohol, or use illegal drugs. Some items may interact with your medicine. What should I watch for while using this medicine? This drug may make you feel generally unwell. This is not uncommon, as chemotherapy can affect healthy cells as well as cancer cells. Report any side effects. Continue your course of treatment even though you feel ill unless your doctor tells you to stop. You may need blood work done while you are taking this medicine. Call your doctor or healthcare provider for advice if you get a fever, chills or sore throat, or other symptoms of a cold or flu. Do not treat yourself. This drug decreases your body's ability to fight infections. Try to avoid being around people who are sick. This medicine may cause serious skin reactions. They can happen weeks to months after starting the medicine. Contact your healthcare provider right away if you notice fevers or flu-like symptoms with a rash. The rash may be red or purple and then turn into blisters or peeling of the skin. Or, you might notice a red rash with swelling of the face, lips or lymph nodes in your neck or under your arms. This medicine may increase your risk to bruise or bleed. Call your doctor or healthcare provider if you notice any unusual bleeding. Talk to your doctor about your risk of cancer. You may be more at risk for certain types of cancers if you take this medicine. Do not become pregnant while taking this medicine or for at least 6 months after stopping it. Women should inform their doctor if they wish to become pregnant or think they might be pregnant. Men should not   father a child while taking this medicine and for at least 3 months after stopping it. There is a potential for serious side effects to an unborn child. Talk to your healthcare provider or pharmacist for more information. Do not breast-feed an infant while taking this medicine or for at least 1 week after stopping it. This medicine may make it  more difficult to father a child. You should talk with your doctor or healthcare provider if you are concerned about your fertility. What side effects may I notice from receiving this medicine? Side effects that you should report to your doctor or health care professional as soon as possible:  allergic reactions like skin rash, itching or hives, swelling of the face, lips, or tongue  low blood counts - this medicine may decrease the number of white blood cells, red blood cells and platelets. You may be at increased risk for infections and bleeding.  rash, fever, and swollen lymph nodes  redness, blistering, peeling, or loosening of the skin, including inside the mouth  signs of infection like fever or chills, cough, sore throat, pain or difficulty passing urine  signs of decreased platelets or bleeding like bruising, pinpoint red spots on the skin, black, tarry stools, blood in the urine  signs of decreased red blood cells like being unusually weak or tired, fainting spells, lightheadedness  signs and symptoms of kidney injury like trouble passing urine or change in the amount of urine  signs and symptoms of liver injury like dark yellow or brown urine; general ill feeling or flu-like symptoms; light-colored stools; loss of appetite; nausea; right upper belly pain; unusually weak or tired; yellowing of the eyes or skin Side effects that usually do not require medical attention (report to your doctor or health care professional if they continue or are bothersome):  constipation  decreased appetite  diarrhea  headache  mouth sores  nausea, vomiting  tiredness This list may not describe all possible side effects. Call your doctor for medical advice about side effects. You may report side effects to FDA at 1-800-FDA-1088. Where should I keep my medicine? This drug is given in a hospital or clinic and will not be stored at home. NOTE: This sheet is a summary. It may not cover all  possible information. If you have questions about this medicine, talk to your doctor, pharmacist, or health care provider.  2020 Elsevier/Gold Standard (2018-03-18 10:26:46)  

## 2018-11-07 ENCOUNTER — Other Ambulatory Visit: Payer: Self-pay

## 2018-11-07 ENCOUNTER — Inpatient Hospital Stay: Payer: Medicare HMO

## 2018-11-07 VITALS — BP 138/64 | HR 45 | Temp 97.1°F | Resp 17

## 2018-11-07 DIAGNOSIS — C8308 Small cell B-cell lymphoma, lymph nodes of multiple sites: Secondary | ICD-10-CM

## 2018-11-07 DIAGNOSIS — Z5111 Encounter for antineoplastic chemotherapy: Secondary | ICD-10-CM | POA: Diagnosis not present

## 2018-11-07 LAB — BETA 2 MICROGLOBULIN, SERUM: Beta-2 Microglobulin: 2.3 mg/L (ref 0.6–2.4)

## 2018-11-07 MED ORDER — DEXAMETHASONE SODIUM PHOSPHATE 10 MG/ML IJ SOLN
INTRAMUSCULAR | Status: AC
  Filled 2018-11-07: qty 1

## 2018-11-07 MED ORDER — SODIUM CHLORIDE 0.9 % IV SOLN
Freq: Once | INTRAVENOUS | Status: AC
  Administered 2018-11-07: 09:00:00 via INTRAVENOUS
  Filled 2018-11-07: qty 250

## 2018-11-07 MED ORDER — DEXAMETHASONE SODIUM PHOSPHATE 10 MG/ML IJ SOLN
10.0000 mg | Freq: Once | INTRAMUSCULAR | Status: AC
  Administered 2018-11-07: 10 mg via INTRAVENOUS

## 2018-11-07 MED ORDER — SODIUM CHLORIDE 0.9 % IV SOLN
81.0000 mg/m2 | Freq: Once | INTRAVENOUS | Status: AC
  Administered 2018-11-07: 200 mg via INTRAVENOUS
  Filled 2018-11-07: qty 8

## 2018-11-07 NOTE — Patient Instructions (Addendum)
  [facility] Discharge Instructions for Patients Receiving Chemotherapy  Today you received the following chemotherapy agents  Bendeka  To help prevent nausea and vomiting after your treatment, we encourage you to take your nausea medication as prescribed by MD.  If you develop nausea and vomiting that is not controlled by your nausea medication, call the clinic.   BELOW ARE SYMPTOMS THAT SHOULD BE REPORTED IMMEDIATELY:  *FEVER GREATER THAN 100.5 F  *CHILLS WITH OR WITHOUT FEVER  NAUSEA AND VOMITING THAT IS NOT CONTROLLED WITH YOUR NAUSEA MEDICATION  *UNUSUAL SHORTNESS OF BREATH  *UNUSUAL BRUISING OR BLEEDING  TENDERNESS IN MOUTH AND THROAT WITH OR WITHOUT PRESENCE OF ULCERS  *URINARY PROBLEMS  *BOWEL PROBLEMS  UNUSUAL RASH Items with * indicate a potential emergency and should be followed up as soon as possible.  Feel free to call the clinic should you have any questions or concerns. The clinic phone number is [phone].  Please show the [facility] at check-in to the Emergency Department and triage nurse.

## 2018-12-11 ENCOUNTER — Other Ambulatory Visit: Payer: Self-pay

## 2018-12-11 ENCOUNTER — Encounter: Payer: Self-pay | Admitting: Hematology & Oncology

## 2018-12-11 ENCOUNTER — Inpatient Hospital Stay: Payer: Medicare HMO | Attending: Hematology & Oncology | Admitting: Hematology & Oncology

## 2018-12-11 ENCOUNTER — Other Ambulatory Visit: Payer: Self-pay | Admitting: Hematology & Oncology

## 2018-12-11 ENCOUNTER — Inpatient Hospital Stay: Payer: Medicare HMO

## 2018-12-11 VITALS — BP 125/68 | HR 50 | Temp 97.0°F | Resp 20 | Wt 277.0 lb

## 2018-12-11 DIAGNOSIS — Z79899 Other long term (current) drug therapy: Secondary | ICD-10-CM | POA: Insufficient documentation

## 2018-12-11 DIAGNOSIS — C8308 Small cell B-cell lymphoma, lymph nodes of multiple sites: Secondary | ICD-10-CM | POA: Insufficient documentation

## 2018-12-11 DIAGNOSIS — Z5111 Encounter for antineoplastic chemotherapy: Secondary | ICD-10-CM | POA: Insufficient documentation

## 2018-12-11 LAB — CBC WITH DIFFERENTIAL (CANCER CENTER ONLY)
Abs Immature Granulocytes: 0.02 10*3/uL (ref 0.00–0.07)
Basophils Absolute: 0 10*3/uL (ref 0.0–0.1)
Basophils Relative: 0 %
Eosinophils Absolute: 0.3 10*3/uL (ref 0.0–0.5)
Eosinophils Relative: 5 %
HCT: 43.7 % (ref 39.0–52.0)
Hemoglobin: 15.6 g/dL (ref 13.0–17.0)
Immature Granulocytes: 0 %
Lymphocytes Relative: 12 %
Lymphs Abs: 0.7 10*3/uL (ref 0.7–4.0)
MCH: 32.2 pg (ref 26.0–34.0)
MCHC: 35.7 g/dL (ref 30.0–36.0)
MCV: 90.3 fL (ref 80.0–100.0)
Monocytes Absolute: 0.7 10*3/uL (ref 0.1–1.0)
Monocytes Relative: 14 %
Neutro Abs: 3.8 10*3/uL (ref 1.7–7.7)
Neutrophils Relative %: 69 %
Platelet Count: 113 10*3/uL — ABNORMAL LOW (ref 150–400)
RBC: 4.84 MIL/uL (ref 4.22–5.81)
RDW: 14.6 % (ref 11.5–15.5)
WBC Count: 5.5 10*3/uL (ref 4.0–10.5)
nRBC: 0 % (ref 0.0–0.2)

## 2018-12-11 LAB — CMP (CANCER CENTER ONLY)
ALT: 13 U/L (ref 0–44)
AST: 13 U/L — ABNORMAL LOW (ref 15–41)
Albumin: 4.4 g/dL (ref 3.5–5.0)
Alkaline Phosphatase: 78 U/L (ref 38–126)
Anion gap: 10 (ref 5–15)
BUN: 20 mg/dL (ref 8–23)
CO2: 23 mmol/L (ref 22–32)
Calcium: 9.4 mg/dL (ref 8.9–10.3)
Chloride: 106 mmol/L (ref 98–111)
Creatinine: 1.18 mg/dL (ref 0.61–1.24)
GFR, Est AFR Am: >60 mL/min (ref >60)
GFR, Estimated: >60 mL/min (ref >60)
Glucose, Bld: 98 mg/dL (ref 70–99)
Potassium: 4.1 mmol/L (ref 3.5–5.1)
Sodium: 139 mmol/L (ref 135–145)
Total Bilirubin: 0.6 mg/dL (ref 0.3–1.2)
Total Protein: 6.6 g/dL (ref 6.5–8.1)

## 2018-12-11 LAB — LACTATE DEHYDROGENASE: LDH: 150 U/L (ref 98–192)

## 2018-12-11 MED ORDER — SODIUM CHLORIDE 0.9 % IV SOLN
Freq: Once | INTRAVENOUS | Status: AC
  Administered 2018-12-11: 09:00:00 via INTRAVENOUS
  Filled 2018-12-11: qty 250

## 2018-12-11 MED ORDER — PALONOSETRON HCL INJECTION 0.25 MG/5ML
INTRAVENOUS | Status: AC
  Filled 2018-12-11: qty 5

## 2018-12-11 MED ORDER — DEXAMETHASONE SODIUM PHOSPHATE 10 MG/ML IJ SOLN
INTRAMUSCULAR | Status: AC
  Filled 2018-12-11: qty 1

## 2018-12-11 MED ORDER — SODIUM CHLORIDE 0.9 % IV SOLN
81.0000 mg/m2 | Freq: Once | INTRAVENOUS | Status: AC
  Administered 2018-12-11: 200 mg via INTRAVENOUS
  Filled 2018-12-11: qty 8

## 2018-12-11 MED ORDER — PALONOSETRON HCL INJECTION 0.25 MG/5ML
0.2500 mg | Freq: Once | INTRAVENOUS | Status: AC
  Administered 2018-12-11: 0.25 mg via INTRAVENOUS

## 2018-12-11 MED ORDER — DEXAMETHASONE SODIUM PHOSPHATE 10 MG/ML IJ SOLN
10.0000 mg | Freq: Once | INTRAMUSCULAR | Status: AC
  Administered 2018-12-11: 09:00:00 10 mg via INTRAVENOUS

## 2018-12-11 NOTE — Patient Instructions (Signed)
[facility] Discharge Instructions for Patients Receiving Chemotherapy  Today you received the following chemotherapy agents Bendeka.   To help prevent nausea and vomiting after your treatment, we encourage you to take your nausea medication as prescribed.    If you develop nausea and vomiting that is not controlled by your nausea medication, call the clinic.   BELOW ARE SYMPTOMS THAT SHOULD BE REPORTED IMMEDIATELY:  *FEVER GREATER THAN 100.5 F  *CHILLS WITH OR WITHOUT FEVER  NAUSEA AND VOMITING THAT IS NOT CONTROLLED WITH YOUR NAUSEA MEDICATION  *UNUSUAL SHORTNESS OF BREATH  *UNUSUAL BRUISING OR BLEEDING  TENDERNESS IN MOUTH AND THROAT WITH OR WITHOUT PRESENCE OF ULCERS  *URINARY PROBLEMS  *BOWEL PROBLEMS  UNUSUAL RASH Items with * indicate a potential emergency and should be followed up as soon as possible.  Feel free to call the clinic should you have any questions or concerns. The clinic phone number is [phone].  Please show the CHEMO ALERT CARD at check-in to the Emergency Department and triage nurse.  

## 2018-12-11 NOTE — Progress Notes (Signed)
Hematology and Oncology Follow Up Visit  David Hubbard [id number] 07-20-1940 78 y.o. 12/11/2018   Principle Diagnosis:   Diffuse small lymphocytic lymphoma  Current Therapy:    Bendamustine -- s/p cycle #3 - start on 10/02/2018     Interim History:  David Hubbard is back for follow-up.  He is a little bit under the weather today.  This is more emotionally than anything else.  He really does not like the cold weather.  Otherwise, he had a good Thanksgiving.  He ate quite a bit.  He has had no problems with fever.  He has had no problems with nausea or vomiting.  Is been no obvious change in bowel or bladder habits.  There has been no headache.  Overall, his performance status is ECOG 1.  Medications:  Current Outpatient Medications:  .  allopurinol (ZYLOPRIM) 100 MG tablet, TAKE 1 TABLET(100 MG) BY MOUTH DAILY, Disp: 90 tablet, Rfl: 0 .  Ascorbic Acid (VITAMIN C CR) 1000 MG TBCR, Take 1 tablet by mouth every morning. , Disp: , Rfl:  .  aspirin (CVS ASPIRIN) 81 MG EC tablet, Take 81 mg by mouth daily. Swallow whole., Disp: , Rfl:  .  B Complex Vitamins (VITAMIN B COMPLEX PO), Take 1 tablet by mouth every morning. , Disp: , Rfl:  .  Cholecalciferol (VITAMIN D-3) 5000 UNITS TABS, Take 1 tablet by mouth daily. , Disp: , Rfl:  .  Coenzyme Q10 (CO Q10) 100 MG TABS, Take 1 capsule by mouth every morning. , Disp: , Rfl:  .  Colesevelam HCl (WELCHOL) 3.75 G PACK, Take 1 packet by mouth daily., Disp: , Rfl:  .  Cranberry 250 MG TABS, Take 1 tablet by mouth every morning. , Disp: , Rfl:  .  diphenhydrAMINE (BENADRYL) 25 mg capsule, Take 25 mg by mouth at bedtime as needed for allergies., Disp: , Rfl:  .  donepezil (ARICEPT) 5 MG tablet, Take 5 mg by mouth daily., Disp: , Rfl:  .  Evolocumab (REPATHA SURECLICK) XX123456 MG/ML SOAJ, INJECT 1ML INTO THE SKIN EVERY 14 DAYS., Disp: , Rfl:  .  famciclovir (FAMVIR) 250 MG tablet, Take 1 tablet (250 mg total) by mouth daily., Disp: 30 tablet, Rfl: 6 .   Grape Seed (RA GRAPE SEED) 100 MG CAPS, Take 1 capsule by mouth every morning. , Disp: , Rfl:  .  loratadine (CLARITIN) 10 MG tablet, Take 10 mg by mouth daily., Disp: , Rfl:  .  metoprolol tartrate (LOPRESSOR) 25 MG tablet, Take 25 mg by mouth 2 (two) times daily. , Disp: , Rfl:  .  Misc Natural Products (PROSTATE HEALTH) CAPS, Take 1 capsule by mouth every morning. , Disp: , Rfl:  .  Multiple Vitamins-Minerals (MENS MULTI VITAMIN & MINERAL PO), Take 1 tablet by mouth every morning. , Disp: , Rfl:  .  Omega-3 Fatty Acids (FISH OIL BURP-LESS) 1200 MG CAPS, Take 2 capsules by mouth 2 (two) times daily. , Disp: , Rfl:  .  ondansetron (ZOFRAN) 8 MG tablet, Take 1 tablet (8 mg total) by mouth 2 (two) times daily as needed for refractory nausea / vomiting. Start on day 2 after bendamustine chemotherapy., Disp: 30 tablet, Rfl: 1 .  prochlorperazine (COMPAZINE) 10 MG tablet, Take 1 tablet (10 mg total) by mouth every 6 (six) hours as needed (Nausea or vomiting)., Disp: 30 tablet, Rfl: 1 .  Resveratrol 250 MG CAPS, Take 1 capsule by mouth daily., Disp: , Rfl:  .  traZODone (DESYREL) 50 MG  tablet, Take 50 mg by mouth at bedtime. , Disp: , Rfl:  .  trimethoprim (TRIMPEX) 100 MG tablet, Take 100 mg by mouth daily. , Disp: , Rfl: 2  Allergies:  Allergies  Allergen Reactions  . Iodinated Diagnostic Agents Nausea And Vomiting    Pt now reports he experiences nausea and vomiting with IV contrast agents after contrast was given tonight    . Other Nausea And Vomiting    Pt now reports he experiences nausea and vomiting with IV contrast agents after contrast was given tonight   . Statins Other (See Comments)    myalgias    Past Medical History, Surgical history, Social history, and Family History were reviewed and updated.  Review of Systems: Review of Systems  Unable to perform ROS: Age  Constitutional: Negative.   HENT:  Negative.   Eyes: Negative.   Respiratory: Negative.   Cardiovascular:  Negative.   Gastrointestinal: Negative.   Endocrine: Negative.   Genitourinary: Negative.    Musculoskeletal: Negative.   Skin: Negative.   Neurological: Negative.   Hematological: Negative.   Psychiatric/Behavioral: Negative.     Physical Exam:  vitals were not taken for this visit.   Wt Readings from Last 3 Encounters:  11/06/18 280 lb (127 kg)  10/02/18 289 lb (131.1 kg)  09/04/18 286 lb (129.7 kg)    Physical Exam Vitals signs reviewed.  HENT:     Head: Normocephalic and atraumatic.  Eyes:     Pupils: Pupils are equal, round, and reactive to light.  Neck:     Musculoskeletal: Normal range of motion.  Cardiovascular:     Rate and Rhythm: Normal rate and regular rhythm.     Heart sounds: Normal heart sounds.  Pulmonary:     Effort: Pulmonary effort is normal.     Breath sounds: Normal breath sounds.  Abdominal:     General: Bowel sounds are normal.     Palpations: Abdomen is soft.  Musculoskeletal: Normal range of motion.        General: No tenderness or deformity.  Lymphadenopathy:     Cervical: No cervical adenopathy.  Skin:    General: Skin is warm and dry.     Findings: No erythema or rash.  Neurological:     Mental Status: He is alert and oriented to person, place, and time.  Psychiatric:        Behavior: Behavior normal.        Thought Content: Thought content normal.        Judgment: Judgment normal.      Lab Results  Component Value Date   WBC 5.5 12/11/2018   HGB 15.6 12/11/2018   HCT 43.7 12/11/2018   MCV 90.3 12/11/2018   PLT 113 (L) 12/11/2018     Chemistry      Component Value Date/Time   NA 139 11/06/2018 0746   K 4.5 11/06/2018 0746   CL 106 11/06/2018 0746   CO2 26 11/06/2018 0746   BUN 19 11/06/2018 0746   CREATININE 1.23 11/06/2018 0746      Component Value Date/Time   CALCIUM 9.7 11/06/2018 0746   ALKPHOS 72 11/06/2018 0746   AST 12 (L) 11/06/2018 0746   ALT 10 11/06/2018 0746   BILITOT 0.5 11/06/2018 0746        Impression and Plan: David Hubbard is a 78 year old white male.  He has progressive diffuse small B-cell lymphocytic lymphoma.  This will be his last cycle of treatment.  I think that 4  cycles would be enough for him.  I really think that he is doing well.    He could not tolerate Rituxan.    I will going get him set up with scans in about 6 weeks.  I do not think we have to do another bone marrow test on him right now.  I does want to try to "ease up" on our evaluation and try to get him back to his lifestyle and his quality of life.     [name], MD 12/3/20208:27 AM

## 2018-12-12 ENCOUNTER — Inpatient Hospital Stay: Payer: Medicare HMO

## 2018-12-12 VITALS — BP 120/57 | HR 57 | Temp 97.8°F | Resp 17 | Ht 74.0 in | Wt 283.0 lb

## 2018-12-12 DIAGNOSIS — C8308 Small cell B-cell lymphoma, lymph nodes of multiple sites: Secondary | ICD-10-CM

## 2018-12-12 DIAGNOSIS — Z5111 Encounter for antineoplastic chemotherapy: Secondary | ICD-10-CM | POA: Diagnosis not present

## 2018-12-12 LAB — BETA 2 MICROGLOBULIN, SERUM: Beta-2 Microglobulin: 2.4 mg/L (ref 0.6–2.4)

## 2018-12-12 LAB — KAPPA/LAMBDA LIGHT CHAINS
Kappa free light chain: 15.1 mg/L (ref 3.3–19.4)
Kappa, lambda light chain ratio: 1.02 (ref 0.26–1.65)
Lambda free light chains: 14.8 mg/L (ref 5.7–26.3)

## 2018-12-12 LAB — IGG, IGA, IGM
IgA: 80 mg/dL (ref 61–437)
IgG (Immunoglobin G), Serum: 531 mg/dL — ABNORMAL LOW (ref 603–1613)
IgM (Immunoglobulin M), Srm: 20 mg/dL (ref 15–143)

## 2018-12-12 MED ORDER — SODIUM CHLORIDE 0.9 % IV SOLN
Freq: Once | INTRAVENOUS | Status: AC
  Administered 2018-12-12: 10:00:00 via INTRAVENOUS
  Filled 2018-12-12: qty 250

## 2018-12-12 MED ORDER — SODIUM CHLORIDE 0.9 % IV SOLN
81.0000 mg/m2 | Freq: Once | INTRAVENOUS | Status: AC
  Administered 2018-12-12: 200 mg via INTRAVENOUS
  Filled 2018-12-12: qty 8

## 2018-12-12 MED ORDER — DEXAMETHASONE SODIUM PHOSPHATE 10 MG/ML IJ SOLN
INTRAMUSCULAR | Status: AC
  Filled 2018-12-12: qty 1

## 2018-12-12 MED ORDER — DEXAMETHASONE SODIUM PHOSPHATE 10 MG/ML IJ SOLN
10.0000 mg | Freq: Once | INTRAMUSCULAR | Status: AC
  Administered 2018-12-12: 10 mg via INTRAVENOUS

## 2018-12-12 NOTE — Progress Notes (Signed)
Discussed with pt placement of Portacath due to 3 day treatment and difficulty finding a vein. Pt verbalized understanding and agreed to a PAC. Order entered

## 2018-12-12 NOTE — Patient Instructions (Signed)
[facility] Discharge Instructions for Patients Receiving Chemotherapy  Today you received the following chemotherapy agents Bendeka.   To help prevent nausea and vomiting after your treatment, we encourage you to take your nausea medication as prescribed.    If you develop nausea and vomiting that is not controlled by your nausea medication, call the clinic.   BELOW ARE SYMPTOMS THAT SHOULD BE REPORTED IMMEDIATELY:  *FEVER GREATER THAN 100.5 F  *CHILLS WITH OR WITHOUT FEVER  NAUSEA AND VOMITING THAT IS NOT CONTROLLED WITH YOUR NAUSEA MEDICATION  *UNUSUAL SHORTNESS OF BREATH  *UNUSUAL BRUISING OR BLEEDING  TENDERNESS IN MOUTH AND THROAT WITH OR WITHOUT PRESENCE OF ULCERS  *URINARY PROBLEMS  *BOWEL PROBLEMS  UNUSUAL RASH Items with * indicate a potential emergency and should be followed up as soon as possible.  Feel free to call the clinic should you have any questions or concerns. The clinic phone number is [phone].  Please show the CHEMO ALERT CARD at check-in to the Emergency Department and triage nurse.  

## 2018-12-18 ENCOUNTER — Telehealth: Payer: Self-pay | Admitting: *Deleted

## 2018-12-18 ENCOUNTER — Telehealth: Payer: Self-pay | Admitting: Hematology & Oncology

## 2018-12-18 NOTE — Telephone Encounter (Signed)
Call received from patient's daughter requesting that appt scheduled for 02/05/19 be moved later in the day so she can accompany her father to appts.  Message sent to scheduling.  Port placement canceled d/t pt.'s treatment is complete per order of Dr. Marin Olp.

## 2018-12-18 NOTE — Telephone Encounter (Signed)
Appointment for port has been cancelled per verbal from Dr Marin Olp.  Treatment has been completed

## 2018-12-22 ENCOUNTER — Telehealth: Payer: Self-pay | Admitting: *Deleted

## 2018-12-22 NOTE — Telephone Encounter (Signed)
Call received from [place] with [facility] to verify doses of patient's Bendeka.  Doses reviewed with Elmyra Ricks.  No further assistance needed at this time per [place].

## 2018-12-25 ENCOUNTER — Ambulatory Visit (HOSPITAL_COMMUNITY): Payer: Medicare HMO

## 2018-12-25 ENCOUNTER — Other Ambulatory Visit (HOSPITAL_COMMUNITY): Payer: Medicare HMO

## 2019-01-22 ENCOUNTER — Ambulatory Visit (HOSPITAL_BASED_OUTPATIENT_CLINIC_OR_DEPARTMENT_OTHER): Payer: Medicare HMO

## 2019-02-04 ENCOUNTER — Encounter: Payer: Self-pay | Admitting: Hematology & Oncology

## 2019-02-04 ENCOUNTER — Inpatient Hospital Stay (HOSPITAL_BASED_OUTPATIENT_CLINIC_OR_DEPARTMENT_OTHER): Payer: Medicare HMO | Admitting: Family

## 2019-02-04 ENCOUNTER — Inpatient Hospital Stay: Payer: Medicare HMO | Attending: Hematology & Oncology

## 2019-02-04 ENCOUNTER — Other Ambulatory Visit: Payer: Self-pay

## 2019-02-04 VITALS — BP 136/80 | HR 48 | Temp 97.1°F | Resp 20 | Wt 272.0 lb

## 2019-02-04 DIAGNOSIS — C8308 Small cell B-cell lymphoma, lymph nodes of multiple sites: Secondary | ICD-10-CM

## 2019-02-04 DIAGNOSIS — Z9221 Personal history of antineoplastic chemotherapy: Secondary | ICD-10-CM | POA: Insufficient documentation

## 2019-02-04 LAB — CBC WITH DIFFERENTIAL (CANCER CENTER ONLY)
Abs Immature Granulocytes: 0.09 10*3/uL — ABNORMAL HIGH (ref 0.00–0.07)
Basophils Absolute: 0.1 10*3/uL (ref 0.0–0.1)
Basophils Relative: 1 %
Eosinophils Absolute: 0.2 10*3/uL (ref 0.0–0.5)
Eosinophils Relative: 4 %
HCT: 42.5 % (ref 39.0–52.0)
Hemoglobin: 14.4 g/dL (ref 13.0–17.0)
Immature Granulocytes: 1 %
Lymphocytes Relative: 15 %
Lymphs Abs: 0.9 10*3/uL (ref 0.7–4.0)
MCH: 33 pg (ref 26.0–34.0)
MCHC: 33.9 g/dL (ref 30.0–36.0)
MCV: 97.5 fL (ref 80.0–100.0)
Monocytes Absolute: 1 10*3/uL (ref 0.1–1.0)
Monocytes Relative: 15 %
Neutro Abs: 4 10*3/uL (ref 1.7–7.7)
Neutrophils Relative %: 64 %
Platelet Count: 139 10*3/uL — ABNORMAL LOW (ref 150–400)
RBC: 4.36 MIL/uL (ref 4.22–5.81)
RDW: 15.6 % — ABNORMAL HIGH (ref 11.5–15.5)
WBC Count: 6.3 10*3/uL (ref 4.0–10.5)
nRBC: 0 % (ref 0.0–0.2)

## 2019-02-04 LAB — CMP (CANCER CENTER ONLY)
ALT: 9 U/L (ref 0–44)
AST: 12 U/L — ABNORMAL LOW (ref 15–41)
Albumin: 4.2 g/dL (ref 3.5–5.0)
Alkaline Phosphatase: 88 U/L (ref 38–126)
Anion gap: 9 (ref 5–15)
BUN: 16 mg/dL (ref 8–23)
CO2: 27 mmol/L (ref 22–32)
Calcium: 10 mg/dL (ref 8.9–10.3)
Chloride: 103 mmol/L (ref 98–111)
Creatinine: 1.14 mg/dL (ref 0.61–1.24)
GFR, Est AFR Am: >60 mL/min (ref >60)
GFR, Estimated: >60 mL/min (ref >60)
Glucose, Bld: 93 mg/dL (ref 70–99)
Potassium: 4.2 mmol/L (ref 3.5–5.1)
Sodium: 139 mmol/L (ref 135–145)
Total Bilirubin: 0.6 mg/dL (ref 0.3–1.2)
Total Protein: 6.6 g/dL (ref 6.5–8.1)

## 2019-02-04 NOTE — Progress Notes (Signed)
Hematology and Oncology Follow Up Visit  David Hubbard [id number] July 10, 1940 79 y.o. 02/04/2019   Principle Diagnosis:  Diffuse small lymphocytic lymphoma  Current Therapy:   Bendamustine -- s/p cycle 4 - completed 12/12/2018   Interim History:  David Hubbard is here today with his daughter for follow-up. He is doing well but has been having a lower heart rate of 48 - 50's since December. Thankfully he is asymptomatic with this so far. He is on Lopressor 25 mg PO BID and they understand that lower heart rate can come from this medication. I suggested they call the prescriber (unsure if it is PCP or cardiology) and make adjustments if needed.   He completed his 4th and final cycle of treatment in December and has recuperated nicely.  No issue with swelling or tenderness in his extremities.  No falls or syncopal episodes.  No fever, chill, n/v, cough, rash, dizziness, lethargy, SOB, chest pain, palpitations, abdominal pain or changes in bowel or bladder habits.  No episodes of bleeding. No bruising or petechiae.  He is eating well and staying hydrated. His weight is stable.   ECOG Performance Status: 0 - Asymptomatic  Medications:  Allergies as of 02/04/2019      Reactions   Iodinated Diagnostic Agents Nausea And Vomiting   Pt now reports he experiences nausea and vomiting with IV contrast agents after contrast was given tonight    Other Nausea And Vomiting   Pt now reports he experiences nausea and vomiting with IV contrast agents after contrast was given tonight    Statins Other (See Comments)   myalgias      Medication List       Accurate as of February 04, 2019  3:23 PM. If you have any questions, ask your nurse or doctor.        STOP taking these medications   Co Q10 100 MG Tabs Generic drug: Coenzyme Q10 Stopped by: Laverna Peace, NP   Cranberry 250 MG Tabs Stopped by: Laverna Peace, NP   diphenhydrAMINE 25 mg capsule Commonly known as: BENADRYL Stopped by: Laverna Peace, NP   Fish Oil Burp-Less [place] by: Laverna Peace, NP   loratadine 10 MG tablet Commonly known as: CLARITIN Stopped by: Laverna Peace, NP   MENS [place] by: Laverna Peace, NP   ondansetron 8 MG tablet Commonly known as: Zofran Stopped by: Laverna Peace, NP   prochlorperazine 10 MG tablet Commonly known as: COMPAZINE Stopped by: Laverna Peace, NP   Prostate Health Caps Stopped by: Laverna Peace, NP   RA Grape Seed 100 MG Caps Generic drug: Grape Seed Stopped by: Laverna Peace, NP   Resveratrol 250 MG Caps Stopped by: Laverna Peace, NP   traZODone 50 MG tablet Commonly known as: DESYREL Stopped by: Laverna Peace, NP   trimethoprim 100 MG tablet Commonly known as: TRIMPEX Stopped by: Laverna Peace, NP   VITAMIN B COMPLEX PO Stopped by: Laverna Peace, NP   Vitamin C CR 1000 MG Tbcr Stopped by: Laverna Peace, NP   Vitamin D-3 125 MCG (5000 UT) Tabs Stopped by: Laverna Peace, NP   Welchol 3.75 g Pack Generic drug: Colesevelam HCl Stopped by: Laverna Peace, NP     TAKE these medications   allopurinol 100 MG tablet Commonly known as: ZYLOPRIM TAKE 1 TABLET(100 MG) BY MOUTH DAILY   CVS Aspirin 81 MG EC tablet Generic drug: aspirin Take 81 mg by mouth daily. Swallow whole.   donepezil  5 MG tablet Commonly known as: ARICEPT Take 5 mg by mouth daily.   famciclovir 250 MG tablet Commonly known as: FAMVIR Take 1 tablet (250 mg total) by mouth daily.   loperamide 2 MG capsule Commonly known as: IMODIUM Take by mouth as needed for diarrhea or loose stools.   metoprolol tartrate 25 MG tablet Commonly known as: LOPRESSOR Take 25 mg by mouth 2 (two) times daily.   Repatha SureClick XX123456 MG/ML Soaj Generic drug: Evolocumab INJECT 1ML INTO THE SKIN EVERY 14 DAYS.       Allergies:  Allergies  Allergen Reactions  . Iodinated Diagnostic Agents Nausea And Vomiting     Pt now reports he experiences nausea and vomiting with IV contrast agents after contrast was given tonight    . Other Nausea And Vomiting    Pt now reports he experiences nausea and vomiting with IV contrast agents after contrast was given tonight   . Statins Other (See Comments)    myalgias    Past Medical History, Surgical history, Social history, and Family History were reviewed and updated.  Review of Systems: All other 10 point review of systems is negative.   Physical Exam:  weight is 272 lb (123.4 kg). His temporal temperature is 97.1 F (36.2 C) (abnormal). His blood pressure is 136/80 and his pulse is 48 (abnormal). His respiration is 20 and oxygen saturation is 95%.   Wt Readings from Last 3 Encounters:  02/04/19 272 lb (123.4 kg)  12/12/18 283 lb (128.4 kg)  12/11/18 277 lb (125.6 kg)    Ocular: Sclerae unicteric, pupils equal, round and reactive to light Ear-nose-throat: Oropharynx clear, dentition fair Lymphatic: No cervical or supraclavicular adenopathy Lungs no rales or rhonchi, good excursion bilaterally Heart regular rate and rhythm, no murmur appreciated Abd soft, nontender, positive bowel sounds, no liver or spleen tip palpated on exam, no fluid wave  MSK no focal spinal tenderness, no joint edema Neuro: non-focal, well-oriented, appropriate affect Breasts: Deferred   Lab Results  Component Value Date   WBC 6.3 02/04/2019   HGB 14.4 02/04/2019   HCT 42.5 02/04/2019   MCV 97.5 02/04/2019   PLT 139 (L) 02/04/2019   No results found for: FERRITIN, IRON, TIBC, UIBC, IRONPCTSAT Lab Results  Component Value Date   RETICCTPCT 0.8 07/11/2011   RBC 4.36 02/04/2019   RETICCTABS 39.7 07/11/2011   Lab Results  Component Value Date   KPAFRELGTCHN 15.1 12/11/2018   LAMBDASER 14.8 12/11/2018   KAPLAMBRATIO 1.02 12/11/2018   Lab Results  Component Value Date   IGGSERUM 531 (L) 12/11/2018   IGA 80 12/11/2018   IGMSERUM 20 12/11/2018   Lab Results    Component Value Date   TOTALPROTELP 6.2 10/02/2018   ALBUMINELP 3.8 10/02/2018   A1GS 0.2 10/02/2018   A2GS 0.9 10/02/2018   BETS 0.9 10/02/2018   BETA2SER 0.3 06/11/2014   GAMS 0.4 10/02/2018   MSPIKE 0.1 (H) 10/02/2018   SPEI * 06/11/2014     Chemistry      Component Value Date/Time   NA 139 02/04/2019 1421   K 4.2 02/04/2019 1421   CL 103 02/04/2019 1421   CO2 27 02/04/2019 1421   BUN 16 02/04/2019 1421   CREATININE 1.14 02/04/2019 1421      Component Value Date/Time   CALCIUM 10.0 02/04/2019 1421   ALKPHOS 88 02/04/2019 1421   AST 12 (L) 02/04/2019 1421   ALT 9 02/04/2019 1421   BILITOT 0.6 02/04/2019 1421  Impression and Plan: David Hubbard is a very pleasant 79 yo caucasian gentleman with progressive diffuse small B-cell lymphocytic lymphoma.  He completed 4 cycles of Bendamustine in December 2020 and is doing well.  For some reason his CT scans that were scheduled for 1/14 were cancelled. We will check into this and see about getting them rescheduled.  We will plan to see him back in another 3 months.  They will contact our office with any questions or concerns. We can certainly see her sooner if needed.   Laverna Peace, NP 1/27/20213:23 PM

## 2019-02-04 NOTE — Progress Notes (Signed)
3:18 PM Spoke with patient's caregiver via phone to verify medications.

## 2019-02-05 ENCOUNTER — Ambulatory Visit: Payer: Medicare HMO | Admitting: Hematology & Oncology

## 2019-02-05 ENCOUNTER — Other Ambulatory Visit: Payer: Medicare HMO

## 2019-02-05 LAB — LACTATE DEHYDROGENASE: LDH: 175 U/L (ref 98–192)

## 2019-02-19 ENCOUNTER — Other Ambulatory Visit: Payer: Self-pay | Admitting: Hematology & Oncology

## 2019-03-08 ENCOUNTER — Ambulatory Visit: Payer: Medicare HMO | Attending: Internal Medicine

## 2019-03-08 DIAGNOSIS — Z23 Encounter for immunization: Secondary | ICD-10-CM

## 2019-03-08 NOTE — Progress Notes (Signed)
   Covid-19 Vaccination Clinic  Name:  David Hubbard    MRN: [id number] DOB: [birth year]  03/08/2019  Mr. Lundstrom was observed post Covid-19 immunization for 15 minutes without incidence. He was provided with Vaccine Information Sheet and instruction to access the V-Safe system.   Mr. Lazur was instructed to call 911 with any severe reactions post vaccine: Marland Kitchen Difficulty breathing  . Swelling of your face and throat  . A fast heartbeat  . A bad rash all over your body  . Dizziness and weakness    Immunizations Administered    Name Date Dose VIS Date Route   Pfizer COVID-19 Vaccine 03/08/2019  2:02 PM 0.3 mL 12/19/2018 Intramuscular   Manufacturer: [place]   Lot: [id number]   [place]: [id number]

## 2019-04-07 ENCOUNTER — Ambulatory Visit: Payer: Medicare HMO | Attending: Internal Medicine

## 2019-04-07 DIAGNOSIS — Z23 Encounter for immunization: Secondary | ICD-10-CM

## 2019-04-07 NOTE — Progress Notes (Signed)
   Covid-19 Vaccination Clinic  Name:  David Hubbard    MRN: [id number] DOB: 03/24/1940  04/07/2019  David Hubbard was observed post Covid-19 immunization for 15 minutes without incident. He was provided with Vaccine Information Sheet and instruction to access the V-Safe system.   David Hubbard was instructed to call 911 with any severe reactions post vaccine: Marland Kitchen Difficulty breathing  . Swelling of face and throat  . A fast heartbeat  . A bad rash all over body  . Dizziness and weakness   Immunizations Administered    Name Date Dose VIS Date Route   Pfizer COVID-19 Vaccine 04/07/2019  4:59 PM 0.3 mL 12/19/2018 Intramuscular   Manufacturer: [place]   Lot: [id number]   [place]: [id number]

## 2019-05-06 ENCOUNTER — Inpatient Hospital Stay: Payer: Medicare HMO | Attending: Hematology & Oncology | Admitting: Hematology & Oncology

## 2019-05-06 ENCOUNTER — Inpatient Hospital Stay: Payer: Medicare HMO

## 2019-05-17 ENCOUNTER — Inpatient Hospital Stay (HOSPITAL_COMMUNITY)
Admission: EM | Admit: 2019-05-17 | Discharge: 2019-05-21 | DRG: 872 | Disposition: A | Discharge status: Home Health | Payer: Medicare HMO | Attending: Internal Medicine | Admitting: Internal Medicine

## 2019-05-17 ENCOUNTER — Encounter (HOSPITAL_COMMUNITY): Payer: Self-pay

## 2019-05-17 ENCOUNTER — Emergency Department (HOSPITAL_COMMUNITY): Payer: Medicare HMO

## 2019-05-17 ENCOUNTER — Other Ambulatory Visit: Payer: Self-pay

## 2019-05-17 DIAGNOSIS — F329 Major depressive disorder, single episode, unspecified: Secondary | ICD-10-CM | POA: Diagnosis present

## 2019-05-17 DIAGNOSIS — E785 Hyperlipidemia, unspecified: Secondary | ICD-10-CM | POA: Diagnosis present

## 2019-05-17 DIAGNOSIS — K5732 Diverticulitis of large intestine without perforation or abscess without bleeding: Secondary | ICD-10-CM | POA: Diagnosis present

## 2019-05-17 DIAGNOSIS — G4733 Obstructive sleep apnea (adult) (pediatric): Secondary | ICD-10-CM | POA: Diagnosis present

## 2019-05-17 DIAGNOSIS — I252 Old myocardial infarction: Secondary | ICD-10-CM | POA: Diagnosis not present

## 2019-05-17 DIAGNOSIS — Z951 Presence of aortocoronary bypass graft: Secondary | ICD-10-CM

## 2019-05-17 DIAGNOSIS — E860 Dehydration: Secondary | ICD-10-CM | POA: Diagnosis present

## 2019-05-17 DIAGNOSIS — K5792 Diverticulitis of intestine, part unspecified, without perforation or abscess without bleeding: Secondary | ICD-10-CM | POA: Diagnosis not present

## 2019-05-17 DIAGNOSIS — Z85828 Personal history of other malignant neoplasm of skin: Secondary | ICD-10-CM

## 2019-05-17 DIAGNOSIS — A419 Sepsis, unspecified organism: Principal | ICD-10-CM | POA: Diagnosis present

## 2019-05-17 DIAGNOSIS — N200 Calculus of kidney: Secondary | ICD-10-CM | POA: Diagnosis present

## 2019-05-17 DIAGNOSIS — G8929 Other chronic pain: Secondary | ICD-10-CM | POA: Diagnosis present

## 2019-05-17 DIAGNOSIS — F0390 Unspecified dementia without behavioral disturbance: Secondary | ICD-10-CM | POA: Diagnosis present

## 2019-05-17 DIAGNOSIS — N1831 Chronic kidney disease, stage 3a: Secondary | ICD-10-CM | POA: Diagnosis present

## 2019-05-17 DIAGNOSIS — C8308 Small cell B-cell lymphoma, lymph nodes of multiple sites: Secondary | ICD-10-CM | POA: Diagnosis present

## 2019-05-17 DIAGNOSIS — I129 Hypertensive chronic kidney disease with stage 1 through stage 4 chronic kidney disease, or unspecified chronic kidney disease: Secondary | ICD-10-CM | POA: Diagnosis present

## 2019-05-17 DIAGNOSIS — C8590 Non-Hodgkin lymphoma, unspecified, unspecified site: Secondary | ICD-10-CM | POA: Diagnosis present

## 2019-05-17 DIAGNOSIS — F419 Anxiety disorder, unspecified: Secondary | ICD-10-CM | POA: Diagnosis present

## 2019-05-17 DIAGNOSIS — Z87442 Personal history of urinary calculi: Secondary | ICD-10-CM

## 2019-05-17 DIAGNOSIS — Z79899 Other long term (current) drug therapy: Secondary | ICD-10-CM

## 2019-05-17 DIAGNOSIS — R32 Unspecified urinary incontinence: Secondary | ICD-10-CM | POA: Diagnosis present

## 2019-05-17 DIAGNOSIS — Z20822 Contact with and (suspected) exposure to covid-19: Secondary | ICD-10-CM | POA: Diagnosis present

## 2019-05-17 DIAGNOSIS — Z87440 Personal history of urinary (tract) infections: Secondary | ICD-10-CM

## 2019-05-17 DIAGNOSIS — I2510 Atherosclerotic heart disease of native coronary artery without angina pectoris: Secondary | ICD-10-CM | POA: Diagnosis present

## 2019-05-17 DIAGNOSIS — Z96653 Presence of artificial knee joint, bilateral: Secondary | ICD-10-CM | POA: Diagnosis present

## 2019-05-17 DIAGNOSIS — K5780 Diverticulitis of intestine, part unspecified, with perforation and abscess without bleeding: Secondary | ICD-10-CM

## 2019-05-17 DIAGNOSIS — Z96612 Presence of left artificial shoulder joint: Secondary | ICD-10-CM | POA: Diagnosis present

## 2019-05-17 DIAGNOSIS — D696 Thrombocytopenia, unspecified: Secondary | ICD-10-CM | POA: Diagnosis present

## 2019-05-17 DIAGNOSIS — G4730 Sleep apnea, unspecified: Secondary | ICD-10-CM | POA: Diagnosis present

## 2019-05-17 DIAGNOSIS — Z87891 Personal history of nicotine dependence: Secondary | ICD-10-CM

## 2019-05-17 DIAGNOSIS — N183 Chronic kidney disease, stage 3 unspecified: Secondary | ICD-10-CM

## 2019-05-17 HISTORY — DX: Diverticulitis of intestine, part unspecified, without perforation or abscess without bleeding: K57.92

## 2019-05-17 LAB — PROTIME-INR
INR: 1.1 (ref 0.8–1.2)
Prothrombin Time: 13.7 seconds (ref 11.4–15.2)

## 2019-05-17 LAB — URINALYSIS, ROUTINE W REFLEX MICROSCOPIC
Bilirubin Urine: NEGATIVE
Glucose, UA: NEGATIVE mg/dL
Hgb urine dipstick: NEGATIVE
Ketones, ur: NEGATIVE mg/dL
Leukocytes,Ua: NEGATIVE
Nitrite: NEGATIVE
Protein, ur: NEGATIVE mg/dL
Specific Gravity, Urine: 1.014 (ref 1.005–1.030)
pH: 5 (ref 5.0–8.0)

## 2019-05-17 LAB — CBC WITH DIFFERENTIAL/PLATELET
Abs Immature Granulocytes: 0.05 10*3/uL (ref 0.00–0.07)
Basophils Absolute: 0 10*3/uL (ref 0.0–0.1)
Basophils Relative: 0 %
Eosinophils Absolute: 0.1 10*3/uL (ref 0.0–0.5)
Eosinophils Relative: 1 %
HCT: 46.6 % (ref 39.0–52.0)
Hemoglobin: 15.4 g/dL (ref 13.0–17.0)
Immature Granulocytes: 1 %
Lymphocytes Relative: 12 %
Lymphs Abs: 1.3 10*3/uL (ref 0.7–4.0)
MCH: 31.4 pg (ref 26.0–34.0)
MCHC: 33 g/dL (ref 30.0–36.0)
MCV: 94.9 fL (ref 80.0–100.0)
Monocytes Absolute: 1.3 10*3/uL — ABNORMAL HIGH (ref 0.1–1.0)
Monocytes Relative: 12 %
Neutro Abs: 7.6 10*3/uL (ref 1.7–7.7)
Neutrophils Relative %: 74 %
Platelets: 123 10*3/uL — ABNORMAL LOW (ref 150–400)
RBC: 4.91 MIL/uL (ref 4.22–5.81)
RDW: 14.1 % (ref 11.5–15.5)
WBC: 10.3 10*3/uL (ref 4.0–10.5)
nRBC: 0 % (ref 0.0–0.2)

## 2019-05-17 LAB — COMPREHENSIVE METABOLIC PANEL
ALT: 16 U/L (ref 0–44)
AST: 18 U/L (ref 15–41)
Albumin: 3.9 g/dL (ref 3.5–5.0)
Alkaline Phosphatase: 80 U/L (ref 38–126)
Anion gap: 10 (ref 5–15)
BUN: 15 mg/dL (ref 8–23)
CO2: 26 mmol/L (ref 22–32)
Calcium: 9.8 mg/dL (ref 8.9–10.3)
Chloride: 103 mmol/L (ref 98–111)
Creatinine, Ser: 1.21 mg/dL (ref 0.61–1.24)
GFR calc Af Amer: >60 mL/min (ref >60)
GFR calc non Af Amer: 58 mL/min — ABNORMAL LOW (ref >60)
Glucose, Bld: 89 mg/dL (ref 70–99)
Potassium: 4.4 mmol/L (ref 3.5–5.1)
Sodium: 139 mmol/L (ref 135–145)
Total Bilirubin: 0.7 mg/dL (ref 0.3–1.2)
Total Protein: 6.4 g/dL — ABNORMAL LOW (ref 6.5–8.1)

## 2019-05-17 LAB — RESPIRATORY PANEL BY RT PCR (FLU A&B, COVID)
Influenza A by PCR: NEGATIVE
Influenza B by PCR: NEGATIVE
SARS Coronavirus 2 by RT PCR: NEGATIVE

## 2019-05-17 LAB — LACTIC ACID, PLASMA: Lactic Acid, Venous: 2 mmol/L (ref 0.5–1.9)

## 2019-05-17 MED ORDER — DONEPEZIL HCL 10 MG PO TABS
10.0000 mg | ORAL_TABLET | Freq: Every day | ORAL | Status: DC
  Administered 2019-05-17 – 2019-05-20 (×4): 10 mg via ORAL
  Filled 2019-05-17 (×5): qty 1

## 2019-05-17 MED ORDER — METRONIDAZOLE IN NACL 5-0.79 MG/ML-% IV SOLN: 500.0000 mg | Freq: Three times a day (TID) | INTRAVENOUS | Status: DC

## 2019-05-17 MED ORDER — SODIUM CHLORIDE 0.9 % IV BOLUS
1000.0000 mL | Freq: Once | INTRAVENOUS | Status: AC
  Administered 2019-05-17: 1000 mL via INTRAVENOUS

## 2019-05-17 MED ORDER — DEXTROSE 5 % IV SOLN
10.0000 mg/kg | Freq: Three times a day (TID) | INTRAVENOUS | Status: DC
  Filled 2019-05-17 (×2): qty 19.9

## 2019-05-17 MED ORDER — SODIUM CHLORIDE 0.9 % IV SOLN: 2.0000 g | Freq: Once | INTRAVENOUS | Status: DC

## 2019-05-17 MED ORDER — SODIUM CHLORIDE 0.9 % IV SOLN
1.0000 g | Freq: Once | INTRAVENOUS | Status: AC
  Administered 2019-05-17: 19:00:00 1 g via INTRAVENOUS
  Filled 2019-05-17: qty 10

## 2019-05-17 MED ORDER — MORPHINE SULFATE (PF) 2 MG/ML IV SOLN: 2.0000 mg | INTRAVENOUS | Status: DC | PRN

## 2019-05-17 MED ORDER — METRONIDAZOLE IN NACL 5-0.79 MG/ML-% IV SOLN
500.0000 mg | Freq: Once | INTRAVENOUS | Status: AC
  Administered 2019-05-17: 19:00:00 500 mg via INTRAVENOUS
  Filled 2019-05-17: qty 100

## 2019-05-17 MED ORDER — SODIUM CHLORIDE 0.9 % IV SOLN: 2.0000 g | Freq: Two times a day (BID) | INTRAVENOUS | Status: DC

## 2019-05-17 MED ORDER — VANCOMYCIN HCL 10 G IV SOLR
2500.0000 mg | Freq: Once | INTRAVENOUS | Status: DC
  Filled 2019-05-17: qty 2500

## 2019-05-17 MED ORDER — ACETAMINOPHEN 325 MG PO TABS
650.0000 mg | ORAL_TABLET | Freq: Once | ORAL | Status: AC
  Administered 2019-05-17: 17:00:00 650 mg via ORAL
  Filled 2019-05-17: qty 2

## 2019-05-17 MED ORDER — ACETAMINOPHEN 325 MG PO TABS: 650.0000 mg | ORAL_TABLET | Freq: Four times a day (QID) | ORAL | Status: DC | PRN

## 2019-05-17 MED ORDER — SODIUM CHLORIDE 0.9 % IV SOLN
1.0000 g | Freq: Once | INTRAVENOUS | Status: AC
  Administered 2019-05-17: 17:00:00 1 g via INTRAVENOUS
  Filled 2019-05-17: qty 10

## 2019-05-17 MED ORDER — SODIUM CHLORIDE 0.9 % IV SOLN
2.0000 g | INTRAVENOUS | Status: DC
  Filled 2019-05-17 (×4): qty 2000

## 2019-05-17 MED ORDER — PIPERACILLIN-TAZOBACTAM 3.375 G IVPB
3.3750 g | Freq: Three times a day (TID) | INTRAVENOUS | Status: DC
  Administered 2019-05-17 – 2019-05-21 (×11): 3.375 g via INTRAVENOUS
  Filled 2019-05-17 (×11): qty 50

## 2019-05-17 MED ORDER — VANCOMYCIN HCL 1500 MG/300ML IV SOLN
1500.0000 mg | Freq: Two times a day (BID) | INTRAVENOUS | Status: DC
  Filled 2019-05-17: qty 300

## 2019-05-17 MED ORDER — ENOXAPARIN SODIUM 40 MG/0.4ML ~~LOC~~ SOLN
40.0000 mg | SUBCUTANEOUS | Status: DC
  Administered 2019-05-17 – 2019-05-20 (×4): 40 mg via SUBCUTANEOUS
  Filled 2019-05-17 (×4): qty 0.4

## 2019-05-17 MED ORDER — ACETAMINOPHEN 650 MG RE SUPP: 650.0000 mg | Freq: Four times a day (QID) | RECTAL | Status: DC | PRN

## 2019-05-17 MED ORDER — SODIUM CHLORIDE 0.9 % IV SOLN: INTRAVENOUS | Status: DC

## 2019-05-17 NOTE — ED Triage Notes (Signed)
Pt BIB [facility] from home for eval of AMS and fever. SO noted pt to be more altered than usual, leaning to one side or another (did change during transport) no unilateral deficits during neuro exam. Pt was incontinent of foul smelling urine en route, febrile to 103 temporally en route, 101.3 on arrival here. Pt is altered, able to answer name but that's It during triage

## 2019-05-17 NOTE — Hospital Course (Addendum)
David Hubbard is a 79 yo male with PMH diverticulitis (last 02/2017), nephrolithiasis, CKDIII, HLD, HTN, sleep apnea, B-cell lymphoma, depression, CAD s/p CABG, anxiety, chronic LBP who presented to the ER with AMS per his significant other. He was also "leaning" unilaterally at times but this position changed and he did not have focal neuro deficits. He also had no photophobia nor nuchal rigidity.  In the ER he was found to be febrile (101.3, with higher fever reported at home PTA), HR 65, RR 25, SpO2 97% RA, BP 158/79 which further downtrended to 101/68 in the ER.  On evaluation in the ER, his daughter is also bedside and she states his mentation is back to baseline and he does have underlying "slowed mentation" and is "forgetful" in nature.   He underwent further imaging studies in the ER including CXR which was negative for focal infiltrates.  CT head negative for acute findings but notable for moderate cerebral atrophy and chronic right maxillary sinusitis. CT abdomen/pelvis was also performed which reveals proximal sigmoid diverticulitis with adjacent 1.3 cm focus of contained free air. General surgery was contacted from the ER with plans to evaluate patient however no urgent need for surgery.  He did endorse lower abdominal pain on ROS. He was unaware of being febrile at home (possibly due to mentation). He denies nausea or vomiting and denies any diarrhea although his daughter reports that she thought his SO mentioned loose stools but he also has a history of occasional loose stools.   Given his initial AMS with unknown source, there was suspicion for possible meningitis initially.  After CT abdomen/pelvis revealed diverticulitis and patient began regaining more lucidity, meningitis was ruled out. He received Rocephin and Flagyl while in the ER.  Blood cultures were obtained. He also received 1 L normal saline bolus. Notable labs include: Lactic acid 2 UA, negative CMP essentially  unremarkable WBC 10.3, 74% neutrophils. Hgb 15.4, HCT 46.6, PLTC 123 INR 1.1

## 2019-05-17 NOTE — Progress Notes (Signed)
Patient arrived to unit from ED. Report received from [place], [place]. Patient alert and oriented x3. Placed on cardiac monitor. MASD noted to groin area bilaterally. Skin reddened, irritated and painful. Site cleansed and applied barrier cream. Male Primofit in place. Patient denies any pain.

## 2019-05-17 NOTE — ED Provider Notes (Signed)
[facility] DEPARTMENT Provider Note   CSN: [id number] Arrival date & time: 05/17/19  1538     History Chief Complaint  Patient presents with  . Fever  . Altered Mental Status    David Hubbard is a 79 y.o. male with a past medical history of B-cell lymphoma, hypertension, kidney stones, CAD presenting to the ED for altered mental status and fever. History is provided by his significant other over the phone.  States that yesterday he was in his usual state of health with the exception of one episode of urinary incontinence.  She states that this occurred because he was unable to make it home to urinate. Today noticed approximately 3 hours ago he had trouble moving the right side of his body and had another episode of urinary incontinence.  She does state that he has a history of UTIs and is concerned that this may be what is going on.  He did not complain of any abdominal pain, chest pain.  She denies any injuries or falls. States that she does not know what symptoms he typically has with a UTI.  HPI     Past Medical History:  Diagnosis Date  . Anginal pain ([place])   . Anxiety   . Chronic lower back pain   . Coronary artery disease   . Depression   . Goals of care, counseling/discussion 08/25/2018  . High cholesterol   . Hypertension   . Kidney stones   . Myocardial infarction ([place]) 2001  . Non-Hodgkin lymphoma ([place])    followed by Dr. Marin Olp for proable low grade non-Hodgkin lymphoma  . Osteoarthritis of right knee 05/26/2012  . Skin cancer    "arms & legs" (05/27/2012)  . Sleep apnea   . 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 Goals of care, counseling/discussion 08/25/2018  . Paronychia of great toe of right foot 07/07/2012  . Pain in joint, ankle and foot 07/07/2012  .  Osteoarthritis of right knee 05/26/2012  . Preop cardiovascular exam 05/08/2012  . Myocardial infarction ([place])   . Hypertension   . Sleep apnea   . Skin cancer   . Coronary artery disease   . Hx of kidney disease     Past Surgical History:  Procedure Laterality Date  . CARDIAC CATHETERIZATION    . CORONARY ARTERY BYPASS GRAFT  2001   CABG X3  . JOINT REPLACEMENT    . KNEE ARTHROSCOPY Bilateral   . LITHOTRIPSY    . SKIN CANCER EXCISION     "arms and legs" (05/27/2012)  . TOTAL KNEE ARTHROPLASTY Left   . TOTAL KNEE ARTHROPLASTY Right 05/26/2012   Procedure: TOTAL KNEE ARTHROPLASTY- right;  Surgeon: Johnny Bridge, MD;  Location: [facility];  Service: Orthopedics;  Laterality: Right;  . TOTAL SHOULDER REPLACEMENT Left 06/2009   Archie Endo 06/21/2009 (05/27/2012)       History reviewed. No pertinent family history.  Social History   Tobacco Use  . Smoking status: Former Smoker    Packs/day: 1.00    Years: 20.00    Pack years: 20.00    Types: Cigarettes    Start date: 11/13/1979    Quit date: 01/09/1999    Years since quitting: 20.3  . Smokeless tobacco: Former Systems developer    Quit date: 01/09/1999  . Tobacco  comment: quit smoking 14 years ago  Substance Use Topics  . Alcohol use: Yes    Alcohol/week: 2.0 standard drinks    Types: 1 Glasses of wine, 1 Cans of beer per week  . Drug use: No    Home Medications Prior to Admission medications   Medication Sig Start Date End Date Taking? Authorizing Provider  allopurinol (ZYLOPRIM) 100 MG tablet TAKE 1 TABLET(100 MG) BY MOUTH DAILY 02/19/19   [name], MD  aspirin ([facility] ASPIRIN) 81 MG EC tablet Take 81 mg by mouth daily. Swallow whole.    [provider]  donepezil (ARICEPT) 5 MG tablet Take 5 mg by mouth daily. 10/15/18   [provider]  Evolocumab (REPATHA SURECLICK) [id number] MG/ML SOAJ INJECT 1ML INTO THE SKIN EVERY 14 DAYS. 09/03/16   [provider]  famciclovir (FAMVIR) 250 MG tablet Take 1 tablet (250 mg  total) by mouth daily. 08/25/18   [name], MD  loperamide (IMODIUM) 2 MG capsule Take by mouth as needed for diarrhea or loose stools.    [provider]  metoprolol tartrate (LOPRESSOR) 25 MG tablet Take 25 mg by mouth 2 (two) times daily.  04/20/11   [provider]    Allergies    Iodinated diagnostic agents, Other, and Statins  Review of Systems   Review of Systems  Unable to perform ROS: Mental status change    Physical Exam Updated Vital Signs BP 101/68   Pulse 61   Temp (!) 101.3 F (38.5 C) (Oral)   Resp 19   Ht 6\' 2"  (1.88 m)   Wt 125 kg   SpO2 95%   BMI 35.38 kg/m   Physical Exam Vitals and nursing note reviewed.  Constitutional:      General: He is not in acute distress.    Appearance: He is well-developed.  HENT:     Head: Normocephalic and atraumatic.     Nose: Nose normal.  Eyes:     General: No scleral icterus.       Right eye: No discharge.        Left eye: No discharge.     Conjunctiva/sclera: Conjunctivae normal.     Pupils: Pupils are equal, round, and reactive to light.  Cardiovascular:     Rate and Rhythm: Normal rate and regular rhythm.     Heart sounds: Normal heart sounds. No murmur. No friction rub. No gallop.   Pulmonary:     Effort: Pulmonary effort is normal. No respiratory distress.     Breath sounds: Normal breath sounds.  Abdominal:     General: Bowel sounds are normal. There is no distension.     Palpations: Abdomen is soft.     Tenderness: There is no abdominal tenderness. There is no guarding.  Musculoskeletal:        General: Normal range of motion.     Cervical back: Normal range of motion and neck supple.  Skin:    General: Skin is warm and dry.     Findings: No rash.  Neurological:     Mental Status: He is alert.     Motor: No abnormal muscle tone.     Coordination: Coordination normal.     Comments: PERRL. No facial asymmetry noted. Patient moving bilateral upper extremities spontaneously.   Able to move his right leg without difficulty.  Unwilling to move his left lower extremity.  Unable to assess for changes to sensation as patient not speaking.     ED  Results / Procedures / Treatments   Labs (all labs ordered are listed, but only abnormal results are displayed) Labs Reviewed  COMPREHENSIVE METABOLIC PANEL - Abnormal; Notable for the following components:      Result Value   Total Protein 6.4 (*)    GFR calc non Af Amer 58 (*)    All other components within normal limits  LACTIC ACID, PLASMA - Abnormal; Notable for the following components:   Lactic Acid, Venous 2.0 (*)    All other components within normal limits  CBC WITH DIFFERENTIAL/PLATELET - Abnormal; Notable for the following components:   Platelets 123 (*)    Monocytes Absolute 1.3 (*)    All other components within normal limits  CULTURE, BLOOD (ROUTINE X 2)  CULTURE, BLOOD (ROUTINE X 2)  URINE CULTURE  CSF CULTURE  GRAM STAIN  HSV CULTURE AND TYPING  RESPIRATORY PANEL BY RT PCR (FLU A&B, COVID)  PROTIME-INR  URINALYSIS, ROUTINE W REFLEX MICROSCOPIC  APTT    EKG EKG Interpretation  Date/Time:  Sunday May 17 2019 15:50:03 EDT Ventricular Rate:  66 PR Interval:    QRS Duration: 106 QT Interval:  386 QTC Calculation: 405 R Axis:   60 Text Interpretation: Sinus rhythm Consider anterior infarct Minimal ST depression, lateral leads Confirmed by Gerlene Fee [phone]) on 05/17/2019 4:12:48 PM   Radiology CT ABDOMEN PELVIS WO CONTRAST  Result Date: 05/17/2019 CLINICAL DATA:  Fever. EXAM: CT ABDOMEN AND PELVIS WITHOUT CONTRAST TECHNIQUE: Multidetector CT imaging of the abdomen and pelvis was performed following the standard protocol without IV contrast. COMPARISON:  August 07, 2018 FINDINGS: Lower chest: Multiple sternal wires are seen. Mild atelectasis is seen within the bilateral lung bases. Hepatobiliary: No focal liver abnormality is seen. No gallstones, gallbladder wall thickening, or biliary dilatation.  Pancreas: Unremarkable. No pancreatic ductal dilatation or surrounding inflammatory changes. Spleen: Normal in size without focal abnormality. Adrenals/Urinary Tract: Adrenal glands are unremarkable. Kidneys are normal in size, without focal lesions or hydronephrosis. A 6 mm nonobstructing renal stone is seen within the posterior aspect of the mid left kidney. Bladder is unremarkable. Stomach/Bowel: Stomach is within normal limits. Appendix appears normal. No evidence of bowel dilatation. Markedly inflamed diverticula are seen within the proximal sigmoid colon. An adjacent 1.3 cm focus of contained free air is seen (axial CT image 85, CT series number 3). Vascular/Lymphatic: Marked severity aortic calcification. No enlarged abdominal or pelvic lymph nodes. Reproductive: The prostate gland is mildly enlarged. Other: No abdominal wall hernia or abnormality. No abdominopelvic ascites. Musculoskeletal: Multilevel degenerative changes seen throughout the lumbar spine. IMPRESSION: 1. Markedly severity sigmoid diverticulitis with an adjacent 1.3 cm focus of contained free air. 2. 6 mm nonobstructing renal stone within the left kidney. Aortic Atherosclerosis (ICD10-I70.0). Electronically Signed   By: Virgina Norfolk M.D.   On: 05/17/2019 18:41   DG Chest 2 View  Result Date: 05/17/2019 CLINICAL DATA:  Altered mental status. EXAM: CHEST - 2 VIEW COMPARISON:  February 28, 2017 FINDINGS: Multiple sternal wires and vascular clips are present. Decreased lung volumes are seen which is likely, in part, secondary to suboptimal patient inspiration. Mild, diffuse chronic appearing increased lung markings are noted. There is no evidence of acute infiltrate, pleural effusion or pneumothorax. The cardiac silhouette is moderately enlarged and unchanged in size. A radiopaque left shoulder replacement is seen. Multilevel degenerative changes seen throughout the thoracic spine. IMPRESSION: 1. Evidence of prior median sternotomy/CABG.  2. Chronic-appearing increased lung markings without evidence of acute or active cardiopulmonary disease.  Electronically Signed   By: Virgina Norfolk M.D.   On: 05/17/2019 16:46   CT Head Wo Contrast  Result Date: 05/17/2019 CLINICAL DATA:  Altered mental status. EXAM: CT HEAD WITHOUT CONTRAST TECHNIQUE: Contiguous axial images were obtained from the base of the skull through the vertex without intravenous contrast. COMPARISON:  None. FINDINGS: Brain: No evidence of acute infarction, hemorrhage, hydrocephalus, extra-axial collection or mass lesion/mass effect. Vascular: Calcific atherosclerotic disease of the intra cavernous carotid arteries. Skull: Normal. Negative for fracture or focal lesion. Sinuses/Orbits: Chronic right maxillary sinusitis. Other: None. IMPRESSION: 1. No acute intracranial abnormality. Moderate brain parenchymal volume loss. 2. Chronic right maxillary sinusitis. Electronically Signed   By: Fidela Salisbury M.D.   On: 05/17/2019 17:22    Procedures .Critical Care Performed by: Delia Heady, PA-C Authorized by: Delia Heady, PA-C   Critical care provider statement:    Critical care time (minutes):  35   Critical care was necessary to treat or prevent imminent or life-threatening deterioration of the following conditions:  Cardiac failure, circulatory failure, CNS failure or compromise and respiratory failure   Critical care was time spent personally by me on the following activities:  Development of treatment plan with patient or surrogate, discussions with consultants, discussions with primary provider, evaluation of patient's response to treatment, obtaining history from patient or surrogate, ordering and performing treatments and interventions, ordering and review of laboratory studies, ordering and review of radiographic studies, re-evaluation of patient's condition, review of old charts and examination of patient   I assumed direction of critical care for this patient  from another provider in my specialty: no     (including critical care time)  Medications Ordered in ED Medications  metroNIDAZOLE (FLAGYL) IVPB 500 mg (500 mg Intravenous New Bag/Given 05/17/19 1842)  piperacillin-tazobactam (ZOSYN) IVPB 3.375 g (has no administration in time range)  acetaminophen (TYLENOL) tablet 650 mg (650 mg Oral Given 05/17/19 1651)  cefTRIAXone (ROCEPHIN) 1 g in sodium chloride 0.9 % 100 mL IVPB (0 g Intravenous Stopped 05/17/19 1722)  sodium chloride 0.9 % bolus 1,000 mL (1,000 mLs Intravenous New Bag/Given 05/17/19 1750)  cefTRIAXone (ROCEPHIN) 1 g in sodium chloride 0.9 % 100 mL IVPB (1 g Intravenous New Bag/Given 05/17/19 1839)    ED Course  I have reviewed the triage vital signs and the nursing notes.  Pertinent labs & imaging results that were available during my care of the patient were reviewed by me and considered in my medical decision making (see chart for details).  Clinical Course as of May 16 1933  Sun May 17, 2019  1755 Spoke to significant other, Vivien Rota, with update and plan for lumbar puncture.   [HK]  [id number] Chart review shows that patient presented in 2019 for similar episode of altered mental status, found to have sepsis from diverticulitis and infected kidney stone.   [HK]    Clinical Course User Index [HK] Delia Heady, PA-C   MDM Rules/Calculators/A&P                      79 year old male presents to ED for altered mental status and fever. Significant other Vivien Rota listed in chart) is concerned that he may have a UTI. Reports today around 3 hours ago started experiencing trouble moving his R arm and R leg.  Patient denies any abdominal pain but not participating on exam or able to tell me much of a history. His girlfriend mentioned he had a similar presentation 76yrs ago. On exam he  appears to be moving extremities without difficulty. Abdomen not generally tender on exam. He is febrile to 101.3. Oxygen saturations are 95% and above on room air. Lab work  significant for lactic of two, CBC without abnormalities. CT of the abdomen pelvis shows diverticulitis with a 1.3 cm area of contained free air. Per general surgery recommendations they will see the patient in consult but they believe this will be managed by antibiotics. On recheck patient able to speak to me without any signs of nuchal rigidity. Suspect that this is the cause of his symptoms today. Will admit to medicine service for continued antibiotics.   All imaging, if done today, including plain films, CT scans, and ultrasounds, independently reviewed by me, and interpretations confirmed via formal radiology reads.  Portions of this note were generated with [employer]. Dictation errors may occur despite best attempts at proofreading.  Final Clinical Impression(s) / ED Diagnoses Final diagnoses:  Diverticulitis    Rx / DC Orders ED Discharge Orders    None       Delia Heady, PA-C 05/17/19 1936    Maudie Flakes, MD 05/21/19 0040

## 2019-05-17 NOTE — H&P (Signed)
History and Physical    David Hubbard [id number] DOB: [birth year] DOA: 05/17/2019  PCP: Audria Nine Patient coming from: home  Chief Complaint: AMS, fever  I have personally briefly reviewed patient's old medical records in [place]  HPI:  David Hubbard is a 79 yo male with PMH diverticulitis (last 02/2017), nephrolithiasis, CKDIII, HLD, HTN, sleep apnea, B-cell lymphoma, depression, CAD s/p CABG, anxiety, chronic LBP who presented to the ER with AMS per his significant other. He was also "leaning" unilaterally at times but this position changed and he did not have focal neuro deficits. He also had no photophobia nor nuchal rigidity.  In the ER he was found to be febrile (101.3, with higher fever reported at home PTA), HR 65, RR 25, SpO2 97% RA, BP 158/79 which further downtrended to 101/68 in the ER.  On evaluation in the ER, his daughter is also bedside and she states his mentation is back to baseline and he does have underlying "slowed mentation" and is "forgetful" in nature.   He underwent further imaging studies in the ER including CXR which was negative for focal infiltrates.  CT head negative for acute findings but notable for moderate cerebral atrophy and chronic right maxillary sinusitis. CT abdomen/pelvis was also performed which reveals proximal sigmoid diverticulitis with adjacent 1.3 cm focus of contained free air. General surgery was contacted from the ER with plans to evaluate patient however no urgent need for surgery.  He did endorse lower abdominal pain on ROS. He was unaware of being febrile at home (possibly due to mentation). He denies nausea or vomiting and denies any diarrhea although his daughter reports that she thought his SO mentioned loose stools but he also has a history of occasional loose stools.   Given his initial AMS with unknown source, there was suspicion for possible meningitis initially.  After CT abdomen/pelvis revealed diverticulitis and patient  began regaining more lucidity, meningitis was ruled out. He received Rocephin and Flagyl while in the ER.  Blood cultures were obtained. He also received 1 L normal saline bolus. Notable labs include: Lactic acid 2 UA, negative CMP essentially unremarkable WBC 10.3, 74% neutrophils. Hgb 15.4, HCT 46.6, PLTC 123 INR 1.1  Assessment/Plan:  Sepsis 2/2 acute sigmoid diverticulitis - initially negative workup with thoughts towards meningitis but ruled out after CT abd/pelvis noted acute sigmoid diverticulitis with 1.3 cm contained free air; he is most tender in L>R lower quadrant but exam was not consistent in the ER - lactic 2; f/u repeat - mentation improved with IVF, abx. No photophobia, nuchal rigidity - s/p rocephin/flagyl in ER - continue on Zosyn monotherapy - surgery consulted in ER; follow up consult  Nephrolithiasis - 6 mm nonbobstructing left stone; UA negative - supportive management  Thrombocytopenia, chronic - 123k on admission. Expect some dilution after IVF - actually higher than previous; appears to have baseline 70-110k - bendamustine more likely to be culprit (see lymphoma)  Memory loss - follows with neurology - started on Aricept 04/2019  Diffuse small lymphocytic lymphoma -Completed 4 cycles of bendamustine on 12/12/2018 - follows outpatient with heme/onc  CAD s/p CABG - continue asa - hold BB in setting of hypotension  HTN -Hypotensive in the ER, home meds on hold  HLD  - on Repatha at home  OSA - RT eval for continuing CPAP  CKDIII  -Baseline creatinine approximately 1.2 -Currently at baseline, avoid nephrotoxic agents as able  Depression/anxiety -Mood appropriate.  No home medications.  Database reviewed  Code Status:  Code Status History    Date Active Date Inactive Code Status Order ID Comments User Context   05/26/2012 2233 05/29/2012 1653 Full Code [id number]  Johnny Bridge, MD Inpatient   Advance Care Planning Activity      DVT  Prophylaxis:enoxaparin (Lovenox) 40mg  SQ 2 hours prior to surgery then every day Anticipated disposition is to home  History: Past Medical History:  Diagnosis Date  . Anginal pain ([place])   . Anxiety   . Chronic lower back pain   . Coronary artery disease   . Depression   . Goals of care, counseling/discussion 08/25/2018  . High cholesterol   . Hypertension   . Kidney stones   . Myocardial infarction ([place]) 2001  . Non-Hodgkin lymphoma ([place])    followed by Dr. Marin Olp for proable low grade non-Hodgkin lymphoma  . Osteoarthritis of right knee 05/26/2012  . Skin cancer    "arms & legs" (05/27/2012)  . Sleep apnea   . 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 CARDIAC CATHETERIZATION    . CORONARY ARTERY BYPASS GRAFT  2001   CABG X3  . JOINT REPLACEMENT    . KNEE ARTHROSCOPY Bilateral   . LITHOTRIPSY    . SKIN CANCER EXCISION     "arms and legs" (05/27/2012)  . TOTAL KNEE ARTHROPLASTY Left   . TOTAL KNEE ARTHROPLASTY Right 05/26/2012   Procedure: TOTAL KNEE ARTHROPLASTY- right;  Surgeon: Johnny Bridge, MD;  [facility];  Service: Orthopedics;  Laterality: Right;  . TOTAL SHOULDER REPLACEMENT Left 06/2009   Archie Endo 06/21/2009 (05/27/2012)     reports that he quit smoking about 20 years ago. His smoking use included cigarettes. He started smoking about 39 years ago. He has a 20.00 pack-year smoking history. He quit smokeless tobacco use about 20 years ago. He reports current alcohol use of about 2.0 standard drinks of alcohol per week. He reports that he does not use drugs.  Allergies  Allergen Reactions  . Iodinated Diagnostic Agents Nausea And Vomiting    Pt now reports he experiences nausea and vomiting with IV contrast agents after contrast was given tonight    . Statins Other (See Comments)    myalgias    History reviewed. No pertinent family history.  Home Medications: Prior to  Admission medications   Medication Sig Start Date End Date Taking? Authorizing Provider  allopurinol (ZYLOPRIM) 100 MG tablet TAKE 1 TABLET(100 MG) BY MOUTH DAILY 02/19/19   [name], MD  aspirin ([facility] ASPIRIN) 81 MG EC tablet Take 81 mg by mouth daily. Swallow whole.    [provider]  donepezil (ARICEPT) 5 MG tablet Take 5 mg by mouth daily. 10/15/18   [provider]  Evolocumab (REPATHA SURECLICK) [id number] MG/ML SOAJ INJECT 1ML INTO THE SKIN EVERY 14 DAYS. 09/03/16   [provider]  famciclovir (FAMVIR) 250 MG tablet Take 1 tablet (250 mg total) by mouth daily. 08/25/18   [name], MD  loperamide (IMODIUM) 2 MG capsule Take by mouth as needed for diarrhea or loose stools.    [provider]  metoprolol tartrate (LOPRESSOR) 25 MG tablet Take 25 mg by mouth 2 (two) times daily.  04/20/11   [provider]    Review of Systems:  Review of Systems - General ROS: negative for - chills or fatigue Respiratory ROS: negative for - cough or shortness of  breath Cardiovascular ROS: negative for - chest pain Gastrointestinal ROS: positive for - abdominal pain  Physical Exam: Vitals:   05/17/19 1900 05/17/19 1915 05/17/19 1930 05/17/19 1945  BP: 101/68 115/61 116/67 108/60  Pulse: 61 63 62 62  Resp: 19     Temp:      TempSrc:      SpO2: 95% 94% 94% 94%  Weight:      Height:       General appearance: Pleasant elderly man with slowed mentation resting in bed in no distress with daughter bedside Head: Normocephalic, without obvious abnormality Eyes: EOMI Lungs: clear to auscultation bilaterally Heart: regular rate and rhythm and S1, S2 normal Abdomen: Inconsistent exam but was tender in right and lower quadrants with no rebound or guarding.  Initially was more tender right compared to the left.  Bowel sounds present Extremities: No edema Skin: mobility and turgor normal Neurologic: Slowed mentation but follows commands and moves all 4  extremities  Labs on Admission:  I have personally reviewed following labs and imaging studies Results for orders placed or performed during the hospital encounter of 05/17/19 (from the past 24 hour(s))  Lactic acid, plasma     Status: Abnormal   Collection Time: 05/17/19  4:15 PM  Result Value Ref Range   Lactic Acid, Venous 2.0 (HH) 0.5 - 1.9 mmol/L  Urinalysis, Routine w reflex microscopic     Status: None   Collection Time: 05/17/19  4:16 PM  Result Value Ref Range   Color, Urine YELLOW YELLOW   APPearance CLEAR CLEAR   Specific Gravity, Urine 1.014 1.005 - 1.030   pH 5.0 5.0 - 8.0   Glucose, UA NEGATIVE NEGATIVE mg/dL   Hgb urine dipstick NEGATIVE NEGATIVE   Bilirubin Urine NEGATIVE NEGATIVE   Ketones, ur NEGATIVE NEGATIVE mg/dL   Protein, ur NEGATIVE NEGATIVE mg/dL   Nitrite NEGATIVE NEGATIVE   Leukocytes,Ua NEGATIVE NEGATIVE  Comprehensive metabolic panel     Status: Abnormal   Collection Time: 05/17/19  4:17 PM  Result Value Ref Range   Sodium 139 135 - 145 mmol/L   Potassium 4.4 3.5 - 5.1 mmol/L   Chloride 103 98 - 111 mmol/L   CO2 26 22 - 32 mmol/L   Glucose, Bld 89 70 - 99 mg/dL   BUN 15 8 - 23 mg/dL   Creatinine, Ser 1.21 0.61 - 1.24 mg/dL   Calcium 9.8 8.9 - 10.3 mg/dL   Total Protein 6.4 (L) 6.5 - 8.1 g/dL   Albumin 3.9 3.5 - 5.0 g/dL   AST 18 15 - 41 U/L   ALT 16 0 - 44 U/L   Alkaline Phosphatase 80 38 - 126 U/L   Total Bilirubin 0.7 0.3 - 1.2 mg/dL   GFR calc non Af Amer 58 (L) >60 mL/min   GFR calc Af Amer >60 >60 mL/min   Anion gap 10 5 - 15  CBC with Differential     Status: Abnormal   Collection Time: 05/17/19  4:17 PM  Result Value Ref Range   WBC 10.3 4.0 - 10.5 K/uL   RBC 4.91 4.22 - 5.81 MIL/uL   Hemoglobin 15.4 13.0 - 17.0 g/dL   HCT 46.6 39.0 - 52.0 %   MCV 94.9 80.0 - 100.0 fL   MCH 31.4 26.0 - 34.0 pg   MCHC 33.0 30.0 - 36.0 g/dL   RDW 14.1 11.5 - 15.5 %   Platelets 123 (L) 150 - 400 K/uL   nRBC 0.0 0.0 - 0.2 %  Neutrophils Relative  % 74 %   Neutro Abs 7.6 1.7 - 7.7 K/uL   Lymphocytes Relative 12 %   Lymphs Abs 1.3 0.7 - 4.0 K/uL   Monocytes Relative 12 %   Monocytes Absolute 1.3 (H) 0.1 - 1.0 K/uL   Eosinophils Relative 1 %   Eosinophils Absolute 0.1 0.0 - 0.5 K/uL   Basophils Relative 0 %   Basophils Absolute 0.0 0.0 - 0.1 K/uL   Immature Granulocytes 1 %   Abs Immature Granulocytes 0.05 0.00 - 0.07 K/uL  Protime-INR     Status: None   Collection Time: 05/17/19  4:17 PM  Result Value Ref Range   Prothrombin Time 13.7 11.4 - 15.2 seconds   INR 1.1 0.8 - 1.2  Respiratory Panel by RT PCR (Flu A&B, Covid) - Nasopharyngeal Swab     Status: None   Collection Time: 05/17/19  5:52 PM   Specimen: Nasopharyngeal Swab  Result Value Ref Range   SARS Coronavirus 2 by RT PCR NEGATIVE NEGATIVE   Influenza A by PCR NEGATIVE NEGATIVE   Influenza B by PCR NEGATIVE NEGATIVE     Radiological Exams on Admission: CT ABDOMEN PELVIS WO CONTRAST  Result Date: 05/17/2019 CLINICAL DATA:  Fever. EXAM: CT ABDOMEN AND PELVIS WITHOUT CONTRAST TECHNIQUE: Multidetector CT imaging of the abdomen and pelvis was performed following the standard protocol without IV contrast. COMPARISON:  August 07, 2018 FINDINGS: Lower chest: Multiple sternal wires are seen. Mild atelectasis is seen within the bilateral lung bases. Hepatobiliary: No focal liver abnormality is seen. No gallstones, gallbladder wall thickening, or biliary dilatation. Pancreas: Unremarkable. No pancreatic ductal dilatation or surrounding inflammatory changes. Spleen: Normal in size without focal abnormality. Adrenals/Urinary Tract: Adrenal glands are unremarkable. Kidneys are normal in size, without focal lesions or hydronephrosis. A 6 mm nonobstructing renal stone is seen within the posterior aspect of the mid left kidney. Bladder is unremarkable. Stomach/Bowel: Stomach is within normal limits. Appendix appears normal. No evidence of bowel dilatation. Markedly inflamed diverticula are seen  within the proximal sigmoid colon. An adjacent 1.3 cm focus of contained free air is seen (axial CT image 85, CT series number 3). Vascular/Lymphatic: Marked severity aortic calcification. No enlarged abdominal or pelvic lymph nodes. Reproductive: The prostate gland is mildly enlarged. Other: No abdominal wall hernia or abnormality. No abdominopelvic ascites. Musculoskeletal: Multilevel degenerative changes seen throughout the lumbar spine. IMPRESSION: 1. Markedly severity sigmoid diverticulitis with an adjacent 1.3 cm focus of contained free air. 2. 6 mm nonobstructing renal stone within the left kidney. Aortic Atherosclerosis (ICD10-I70.0). Electronically Signed   By: Virgina Norfolk M.D.   On: 05/17/2019 18:41   DG Chest 2 View  Result Date: 05/17/2019 CLINICAL DATA:  Altered mental status. EXAM: CHEST - 2 VIEW COMPARISON:  February 28, 2017 FINDINGS: Multiple sternal wires and vascular clips are present. Decreased lung volumes are seen which is likely, in part, secondary to suboptimal patient inspiration. Mild, diffuse chronic appearing increased lung markings are noted. There is no evidence of acute infiltrate, pleural effusion or pneumothorax. The cardiac silhouette is moderately enlarged and unchanged in size. A radiopaque left shoulder replacement is seen. Multilevel degenerative changes seen throughout the thoracic spine. IMPRESSION: 1. Evidence of prior median sternotomy/CABG. 2. Chronic-appearing increased lung markings without evidence of acute or active cardiopulmonary disease. Electronically Signed   By: Virgina Norfolk M.D.   On: 05/17/2019 16:46   CT Head Wo Contrast  Result Date: 05/17/2019 CLINICAL DATA:  Altered mental status. EXAM: CT HEAD  WITHOUT CONTRAST TECHNIQUE: Contiguous axial images were obtained from the base of the skull through the vertex without intravenous contrast. COMPARISON:  None. FINDINGS: Brain: No evidence of acute infarction, hemorrhage, hydrocephalus, extra-axial  collection or mass lesion/mass effect. Vascular: Calcific atherosclerotic disease of the intra cavernous carotid arteries. Skull: Normal. Negative for fracture or focal lesion. Sinuses/Orbits: Chronic right maxillary sinusitis. Other: None. IMPRESSION: 1. No acute intracranial abnormality. Moderate brain parenchymal volume loss. 2. Chronic right maxillary sinusitis. Electronically Signed   By: Fidela Salisbury M.D.   On: 05/17/2019 17:22   CT ABDOMEN PELVIS WO CONTRAST  Final Result    CT Head Wo Contrast  Final Result    DG Chest 2 View  Final Result      Consults called:  General surgery  EKG: Independently reviewed. Normal sinus rhythm    Dwyane Dee, MD [facility] Pager: Secure chat via [place] pager # : [phone]  If 7PM-7AM, please contact night-coverage [email or web address] Use universal [place] password for that web site. If you do not have the password, please call the hospital operator.  05/17/2019, 8:38 PM

## 2019-05-17 NOTE — Consult Note (Signed)
CC/Reason for consult: Diverticulitis  Requesting provider: Delia Heady PA-C  HPI: David Hubbard is an 79 y.o. male with HTN, HLD, CAD s/p CABG, who was brought into the emergency department by EMS for altered mental status.  He also had by report foul-smelling urine at some point in time.  He is now here with his daughter.  She reports that he lives at home with his girlfriend.  She reports that over the last 1 to 2 years, he has developed some degree of dementia that has been progressively worsening and that his health has been "declining."  He was reportedly altered on arrival but over the course of his time here in the emergency department has improved.  He is now able to answer questions about where he is, who he is, and personal history questions.  He reports that his primary care doctor is through [facility].  He believes his last colonoscopy was in 2009.  At this point time, he denies any abdominal pain but does report fever at home.  He denies any particulate in his urine or passing air with his urine stream.  T-max 38.5, vitals normal WBC normal Urinalysis negative  Because of his constellation of issues, he underwent CT scan abdomen pelvis which demonstrated sigmoid diverticulitis and an adjacent 1.3 cm focus of contained air.  We were asked to see  Past Medical History:  Diagnosis Date  . Anginal pain ([place])   . Anxiety   . Chronic lower back pain   . Coronary artery disease   . Depression   . Goals of care, counseling/discussion 08/25/2018  . High cholesterol   . Hypertension   . Kidney stones   . Myocardial infarction ([place]) 2001  . 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 Osteoarthritis of right knee 05/26/2012  . Skin cancer    "arms & legs" (05/27/2012)  . Sleep apnea   . 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 CARDIAC  CATHETERIZATION    . CORONARY ARTERY BYPASS GRAFT  2001   CABG X3  . JOINT REPLACEMENT    . KNEE ARTHROSCOPY Bilateral   . LITHOTRIPSY    . SKIN CANCER EXCISION     "arms and legs" (05/27/2012)  . TOTAL KNEE ARTHROPLASTY Left   . TOTAL KNEE ARTHROPLASTY Right 05/26/2012   Procedure: TOTAL KNEE ARTHROPLASTY- right;  Surgeon: Johnny Bridge, MD;  Location: [facility];  Service: Orthopedics;  Laterality: Right;  . TOTAL SHOULDER REPLACEMENT Left 06/2009   Archie Endo 06/21/2009 (05/27/2012)    History reviewed. No pertinent family history.  Social:  reports that he quit smoking about 20 years ago. His smoking use included cigarettes. He started smoking about 39 years ago. He has a 20.00 pack-year smoking history. He quit smokeless tobacco use about 20 years ago. He reports current alcohol use of about 2.0 standard drinks of alcohol per week. He reports that he does not use drugs.  Allergies:  Allergies  Allergen Reactions  . Iodinated Diagnostic Agents Nausea And Vomiting    Pt now reports he experiences nausea and vomiting with IV contrast agents after contrast was given tonight    . Statins Other (See Comments)    myalgias    Medications: I have reviewed the patient's current medications.  Results for orders placed or performed during the hospital encounter of 05/17/19 (from the  past 48 hour(s))  Lactic acid, plasma     Status: Abnormal   Collection Time: 05/17/19  4:15 PM  Result Value Ref Range   Lactic Acid, Venous 2.0 (HH) 0.5 - 1.9 mmol/L    Comment: CRITICAL RESULT CALLED TO, READ BACK BY AND VERIFIED WITH: RN A OLEARY AT 1643 05/17/19 BY L BENFIELD Performed at [facility], [place] [address], [place], [place] [address]   Urinalysis, Routine w reflex microscopic     Status: None   Collection Time: 05/17/19  4:16 PM  Result Value Ref Range   Color, Urine YELLOW YELLOW   APPearance CLEAR CLEAR   Specific Gravity, Urine 1.014 1.005 - 1.030   pH 5.0 5.0 - 8.0   Glucose, UA  NEGATIVE NEGATIVE mg/dL   Hgb urine dipstick NEGATIVE NEGATIVE   Bilirubin Urine NEGATIVE NEGATIVE   Ketones, ur NEGATIVE NEGATIVE mg/dL   Protein, ur NEGATIVE NEGATIVE mg/dL   Nitrite NEGATIVE NEGATIVE   Leukocytes,Ua NEGATIVE NEGATIVE    Comment: Performed at [facility] [address], [place], [place] [address]  Comprehensive metabolic panel     Status: Abnormal   Collection Time: 05/17/19  4:17 PM  Result Value Ref Range   Sodium 139 135 - 145 mmol/L   Potassium 4.4 3.5 - 5.1 mmol/L   Chloride 103 98 - 111 mmol/L   CO2 26 22 - 32 mmol/L   Glucose, Bld 89 70 - 99 mg/dL    Comment: Glucose reference range applies only to samples taken after fasting for at least 8 hours.   BUN 15 8 - 23 mg/dL   Creatinine, Ser 1.21 0.61 - 1.24 mg/dL   Calcium 9.8 8.9 - 10.3 mg/dL   Total Protein 6.4 (L) 6.5 - 8.1 g/dL   Albumin 3.9 3.5 - 5.0 g/dL   AST 18 15 - 41 U/L   ALT 16 0 - 44 U/L   Alkaline Phosphatase 80 38 - 126 U/L   Total Bilirubin 0.7 0.3 - 1.2 mg/dL   GFR calc non Af Amer 58 (L) >60 mL/min   GFR calc Af Amer >60 >60 mL/min   Anion gap 10 5 - 15    Comment: Performed at [facility] [address], [place], [place] [address]  CBC with Differential     Status: Abnormal   Collection Time: 05/17/19  4:17 PM  Result Value Ref Range   WBC 10.3 4.0 - 10.5 K/uL   RBC 4.91 4.22 - 5.81 MIL/uL   Hemoglobin 15.4 13.0 - 17.0 g/dL   HCT 46.6 39.0 - 52.0 %   MCV 94.9 80.0 - 100.0 fL   MCH 31.4 26.0 - 34.0 pg   MCHC 33.0 30.0 - 36.0 g/dL   RDW 14.1 11.5 - 15.5 %   Platelets 123 (L) 150 - 400 K/uL   nRBC 0.0 0.0 - 0.2 %   Neutrophils Relative % 74 %   Neutro Abs 7.6 1.7 - 7.7 K/uL   Lymphocytes Relative 12 %   Lymphs Abs 1.3 0.7 - 4.0 K/uL   Monocytes Relative 12 %   Monocytes Absolute 1.3 (H) 0.1 - 1.0 K/uL   Eosinophils Relative 1 %   Eosinophils Absolute 0.1 0.0 - 0.5 K/uL   Basophils Relative 0 %   Basophils Absolute 0.0 0.0 - 0.1 K/uL   Immature Granulocytes 1 %    Abs Immature Granulocytes 0.05 0.00 - 0.07 K/uL    Comment: Performed at [facility], [address], [place], [place] [address]  Protime-INR     Status: None   Collection Time: 05/17/19  4:17 PM  Result Value Ref Range   Prothrombin Time 13.7 11.4 - 15.2 seconds   INR 1.1 0.8 - 1.2    Comment: (NOTE) INR goal varies based on device and disease states. Performed at [facility], [place] [address] [place], [place] [address]   Respiratory Panel by RT PCR (Flu A&B, Covid) - Nasopharyngeal Swab     Status: None   Collection Time: 05/17/19  5:52 PM   Specimen: Nasopharyngeal Swab  Result Value Ref Range   SARS Coronavirus 2 by RT PCR NEGATIVE NEGATIVE    Comment: (NOTE) SARS-CoV-2 target nucleic acids are NOT DETECTED. The SARS-CoV-2 RNA is generally detectable in upper respiratoy specimens during the acute phase of infection. The lowest concentration of SARS-CoV-2 viral copies this assay can detect is 131 copies/mL. A negative result does not preclude SARS-Cov-2 infection and should not be used as the sole basis for treatment or other patient management decisions. A negative result may occur with  improper specimen collection/handling, submission of specimen other than nasopharyngeal swab, presence of viral mutation(s) within the areas targeted by this assay, and inadequate number of viral copies (<131 copies/mL). A negative result must be combined with clinical observations, patient history, and epidemiological information. The expected result is Negative. Fact Sheet for Patients:  PinkCheek.be Fact Sheet for Healthcare Providers:  GravelBags.it This test is not yet ap proved or cleared by the [place] FDA and  has been authorized for detection and/or diagnosis of SARS-CoV-2 by FDA under an Emergency Use Authorization (EUA). This EUA will remain  in effect (meaning this test can be used) for the duration of  the COVID-19 declaration under Section 564(b)(1) of the Act, 21 U.S.C. section 360bbb-3(b)(1), unless the authorization is terminated or revoked sooner.    Influenza A by PCR NEGATIVE NEGATIVE   Influenza B by PCR NEGATIVE NEGATIVE    Comment: (NOTE) The Xpert Xpress SARS-CoV-2/FLU/RSV assay is intended as an aid in  the diagnosis of influenza from Nasopharyngeal swab specimens and  should not be used as a sole basis for treatment. Nasal washings and  aspirates are unacceptable for Xpert Xpress SARS-CoV-2/FLU/RSV  testing. Fact Sheet for Patients: PinkCheek.be Fact Sheet for Healthcare Providers: GravelBags.it This test is not yet approved or cleared by the [place] FDA and  has been authorized for detection and/or diagnosis of SARS-CoV-2 by  FDA under an Emergency Use Authorization (EUA). This EUA will remain  in effect (meaning this test can be used) for the duration of the  Covid-19 declaration under Section 564(b)(1) of the Act, 21  U.S.C. section 360bbb-3(b)(1), unless the authorization is  terminated or revoked. Performed at [facility], [place] [address], [place], [place] [address]     CT ABDOMEN PELVIS WO CONTRAST  Result Date: 05/17/2019 CLINICAL DATA:  Fever. EXAM: CT ABDOMEN AND PELVIS WITHOUT CONTRAST TECHNIQUE: Multidetector CT imaging of the abdomen and pelvis was performed following the standard protocol without IV contrast. COMPARISON:  August 07, 2018 FINDINGS: Lower chest: Multiple sternal wires are seen. Mild atelectasis is seen within the bilateral lung bases. Hepatobiliary: No focal liver abnormality is seen. No gallstones, gallbladder wall thickening, or biliary dilatation. Pancreas: Unremarkable. No pancreatic ductal dilatation or surrounding inflammatory changes. Spleen: Normal in size without focal abnormality. Adrenals/Urinary Tract: Adrenal glands are unremarkable. Kidneys are normal in size,  without focal lesions  or hydronephrosis. A 6 mm nonobstructing renal stone is seen within the posterior aspect of the mid left kidney. Bladder is unremarkable. Stomach/Bowel: Stomach is within normal limits. Appendix appears normal. No evidence of bowel dilatation. Markedly inflamed diverticula are seen within the proximal sigmoid colon. An adjacent 1.3 cm focus of contained free air is seen (axial CT image 85, CT series number 3). Vascular/Lymphatic: Marked severity aortic calcification. No enlarged abdominal or pelvic lymph nodes. Reproductive: The prostate gland is mildly enlarged. Other: No abdominal wall hernia or abnormality. No abdominopelvic ascites. Musculoskeletal: Multilevel degenerative changes seen throughout the lumbar spine. IMPRESSION: 1. Markedly severity sigmoid diverticulitis with an adjacent 1.3 cm focus of contained free air. 2. 6 mm nonobstructing renal stone within the left kidney. Aortic Atherosclerosis (ICD10-I70.0). Electronically Signed   By: Virgina Norfolk M.D.   On: 05/17/2019 18:41   DG Chest 2 View  Result Date: 05/17/2019 CLINICAL DATA:  Altered mental status. EXAM: CHEST - 2 VIEW COMPARISON:  February 28, 2017 FINDINGS: Multiple sternal wires and vascular clips are present. Decreased lung volumes are seen which is likely, in part, secondary to suboptimal patient inspiration. Mild, diffuse chronic appearing increased lung markings are noted. There is no evidence of acute infiltrate, pleural effusion or pneumothorax. The cardiac silhouette is moderately enlarged and unchanged in size. A radiopaque left shoulder replacement is seen. Multilevel degenerative changes seen throughout the thoracic spine. IMPRESSION: 1. Evidence of prior median sternotomy/CABG. 2. Chronic-appearing increased lung markings without evidence of acute or active cardiopulmonary disease. Electronically Signed   By: Virgina Norfolk M.D.   On: 05/17/2019 16:46   CT Head Wo Contrast  Result Date:  05/17/2019 CLINICAL DATA:  Altered mental status. EXAM: CT HEAD WITHOUT CONTRAST TECHNIQUE: Contiguous axial images were obtained from the base of the skull through the vertex without intravenous contrast. COMPARISON:  None. FINDINGS: Brain: No evidence of acute infarction, hemorrhage, hydrocephalus, extra-axial collection or mass lesion/mass effect. Vascular: Calcific atherosclerotic disease of the intra cavernous carotid arteries. Skull: Normal. Negative for fracture or focal lesion. Sinuses/Orbits: Chronic right maxillary sinusitis. Other: None. IMPRESSION: 1. No acute intracranial abnormality. Moderate brain parenchymal volume loss. 2. Chronic right maxillary sinusitis. Electronically Signed   By: Fidela Salisbury M.D.   On: 05/17/2019 17:22    ROS - all of the below systems have been reviewed with the patient and positives are indicated with bold text General: chills, fever or night sweats Eyes: blurry vision or double vision ENT: epistaxis or sore throat Allergy/Immunology: itchy/watery eyes or nasal congestion Hematologic/Lymphatic: bleeding problems, blood clots or swollen lymph nodes Endocrine: temperature intolerance or unexpected weight changes Breast: new or changing breast lumps or nipple discharge Resp: cough, shortness of breath, or wheezing CV: chest pain or dyspnea on exertion GI: as per HPI GU: dysuria, trouble voiding, or hematuria MSK: joint pain or joint stiffness Neuro: TIA or stroke symptoms Derm: pruritus and skin lesion changes Psych: anxiety and depression; declining memory  PE Blood pressure 108/60, pulse 62, temperature (!) 101.3 F (38.5 C), temperature source Oral, resp. rate 19, height 6\' 2"  (1.88 m), weight 125 kg, SpO2 94 %. Constitutional: NAD; conversant; no deformities Eyes: Moist conjunctiva; no lid lag; anicteric; PERRL Neck: Trachea midline; no thyromegaly Lungs: Normal respiratory effort; no tactile fremitus CV: RRR; no palpable thrills; 1+ pitting  edema GI: Abd soft, not significantly tender, non distended; no rebound/guarding; no palpable hepatosplenomegaly MSK: Normal range of motion of extremities; no clubbing/cyanosis Psychiatric: Appropriate affect; alert and oriented to person  and place only, not time Lymphatic: No palpable cervical or axillary lymphadenopathy  Results for orders placed or performed during the hospital encounter of 05/17/19 (from the past 48 hour(s))  Lactic acid, plasma     Status: Abnormal   Collection Time: 05/17/19  4:15 PM  Result Value Ref Range   Lactic Acid, Venous 2.0 (HH) 0.5 - 1.9 mmol/L    Comment: CRITICAL RESULT CALLED TO, READ BACK BY AND VERIFIED WITH: RN A OLEARY AT 1643 05/17/19 BY L BENFIELD Performed at [facility], [place] [address], [place], [place] [address]   Urinalysis, Routine w reflex microscopic     Status: None   Collection Time: 05/17/19  4:16 PM  Result Value Ref Range   Color, Urine YELLOW YELLOW   APPearance CLEAR CLEAR   Specific Gravity, Urine 1.014 1.005 - 1.030   pH 5.0 5.0 - 8.0   Glucose, UA NEGATIVE NEGATIVE mg/dL   Hgb urine dipstick NEGATIVE NEGATIVE   Bilirubin Urine NEGATIVE NEGATIVE   Ketones, ur NEGATIVE NEGATIVE mg/dL   Protein, ur NEGATIVE NEGATIVE mg/dL   Nitrite NEGATIVE NEGATIVE   Leukocytes,Ua NEGATIVE NEGATIVE    Comment: Performed at [facility] [address], [place], [place] [address]  Comprehensive metabolic panel     Status: Abnormal   Collection Time: 05/17/19  4:17 PM  Result Value Ref Range   Sodium 139 135 - 145 mmol/L   Potassium 4.4 3.5 - 5.1 mmol/L   Chloride 103 98 - 111 mmol/L   CO2 26 22 - 32 mmol/L   Glucose, Bld 89 70 - 99 mg/dL    Comment: Glucose reference range applies only to samples taken after fasting for at least 8 hours.   BUN 15 8 - 23 mg/dL   Creatinine, Ser 1.21 0.61 - 1.24 mg/dL   Calcium 9.8 8.9 - 10.3 mg/dL   Total Protein 6.4 (L) 6.5 - 8.1 g/dL   Albumin 3.9 3.5 - 5.0 g/dL   AST 18 15 - 41 U/L    ALT 16 0 - 44 U/L   Alkaline Phosphatase 80 38 - 126 U/L   Total Bilirubin 0.7 0.3 - 1.2 mg/dL   GFR calc non Af Amer 58 (L) >60 mL/min   GFR calc Af Amer >60 >60 mL/min   Anion gap 10 5 - 15    Comment: Performed at [facility] [address], [place], [place] [address]  CBC with Differential     Status: Abnormal   Collection Time: 05/17/19  4:17 PM  Result Value Ref Range   WBC 10.3 4.0 - 10.5 K/uL   RBC 4.91 4.22 - 5.81 MIL/uL   Hemoglobin 15.4 13.0 - 17.0 g/dL   HCT 46.6 39.0 - 52.0 %   MCV 94.9 80.0 - 100.0 fL   MCH 31.4 26.0 - 34.0 pg   MCHC 33.0 30.0 - 36.0 g/dL   RDW 14.1 11.5 - 15.5 %   Platelets 123 (L) 150 - 400 K/uL   nRBC 0.0 0.0 - 0.2 %   Neutrophils Relative % 74 %   Neutro Abs 7.6 1.7 - 7.7 K/uL   Lymphocytes Relative 12 %   Lymphs Abs 1.3 0.7 - 4.0 K/uL   Monocytes Relative 12 %   Monocytes Absolute 1.3 (H) 0.1 - 1.0 K/uL   Eosinophils Relative 1 %   Eosinophils Absolute 0.1 0.0 - 0.5 K/uL   Basophils Relative 0 %   Basophils Absolute 0.0 0.0 - 0.1  K/uL   Immature Granulocytes 1 %   Abs Immature Granulocytes 0.05 0.00 - 0.07 K/uL    Comment: Performed at [facility], [place] [address], [place], [place] [address]  Protime-INR     Status: None   Collection Time: 05/17/19  4:17 PM  Result Value Ref Range   Prothrombin Time 13.7 11.4 - 15.2 seconds   INR 1.1 0.8 - 1.2    Comment: (NOTE) INR goal varies based on device and disease states. Performed at [facility], [place] [address], [place], [place] [address]   Respiratory Panel by RT PCR (Flu A&B, Covid) - Nasopharyngeal Swab     Status: None   Collection Time: 05/17/19  5:52 PM   Specimen: Nasopharyngeal Swab  Result Value Ref Range   SARS Coronavirus 2 by RT PCR NEGATIVE NEGATIVE    Comment: (NOTE) SARS-CoV-2 target nucleic acids are NOT DETECTED. The SARS-CoV-2 RNA is generally detectable in upper respiratoy specimens during the acute phase of infection. The lowest concentration of  SARS-CoV-2 viral copies this assay can detect is 131 copies/mL. A negative result does not preclude SARS-Cov-2 infection and should not be used as the sole basis for treatment or other patient management decisions. A negative result may occur with  improper specimen collection/handling, submission of specimen other than nasopharyngeal swab, presence of viral mutation(s) within the areas targeted by this assay, and inadequate number of viral copies (<131 copies/mL). A negative result must be combined with clinical observations, patient history, and epidemiological information. The expected result is Negative. Fact Sheet for Patients:  PinkCheek.be Fact Sheet for Healthcare Providers:  GravelBags.it This test is not yet ap proved or cleared by the [place] FDA and  has been authorized for detection and/or diagnosis of SARS-CoV-2 by FDA under an Emergency Use Authorization (EUA). This EUA will remain  in effect (meaning this test can be used) for the duration of the COVID-19 declaration under Section 564(b)(1) of the Act, 21 U.S.C. section 360bbb-3(b)(1), unless the authorization is terminated or revoked sooner.    Influenza A by PCR NEGATIVE NEGATIVE   Influenza B by PCR NEGATIVE NEGATIVE    Comment: (NOTE) The Xpert Xpress SARS-CoV-2/FLU/RSV assay is intended as an aid in  the diagnosis of influenza from Nasopharyngeal swab specimens and  should not be used as a sole basis for treatment. Nasal washings and  aspirates are unacceptable for Xpert Xpress SARS-CoV-2/FLU/RSV  testing. Fact Sheet for Patients: PinkCheek.be Fact Sheet for Healthcare Providers: GravelBags.it This test is not yet approved or cleared by the [place] FDA and  has been authorized for detection and/or diagnosis of SARS-CoV-2 by  FDA under an Emergency Use Authorization (EUA). This EUA will  remain  in effect (meaning this test can be used) for the duration of the  Covid-19 declaration under Section 564(b)(1) of the Act, 21  U.S.C. section 360bbb-3(b)(1), unless the authorization is  terminated or revoked. Performed at [facility], [place] [address] [place], [place] [address]     CT ABDOMEN PELVIS WO CONTRAST  Result Date: 05/17/2019 CLINICAL DATA:  Fever. EXAM: CT ABDOMEN AND PELVIS WITHOUT CONTRAST TECHNIQUE: Multidetector CT imaging of the abdomen and pelvis was performed following the standard protocol without IV contrast. COMPARISON:  August 07, 2018 FINDINGS: Lower chest: Multiple sternal wires are seen. Mild atelectasis is seen within the bilateral lung bases. Hepatobiliary: No focal liver abnormality is seen. No gallstones, gallbladder wall thickening, or biliary dilatation. Pancreas: Unremarkable. No pancreatic ductal  dilatation or surrounding inflammatory changes. Spleen: Normal in size without focal abnormality. Adrenals/Urinary Tract: Adrenal glands are unremarkable. Kidneys are normal in size, without focal lesions or hydronephrosis. A 6 mm nonobstructing renal stone is seen within the posterior aspect of the mid left kidney. Bladder is unremarkable. Stomach/Bowel: Stomach is within normal limits. Appendix appears normal. No evidence of bowel dilatation. Markedly inflamed diverticula are seen within the proximal sigmoid colon. An adjacent 1.3 cm focus of contained free air is seen (axial CT image 85, CT series number 3). Vascular/Lymphatic: Marked severity aortic calcification. No enlarged abdominal or pelvic lymph nodes. Reproductive: The prostate gland is mildly enlarged. Other: No abdominal wall hernia or abnormality. No abdominopelvic ascites. Musculoskeletal: Multilevel degenerative changes seen throughout the lumbar spine. IMPRESSION: 1. Markedly severity sigmoid diverticulitis with an adjacent 1.3 cm focus of contained free air. 2. 6 mm nonobstructing renal stone within  the left kidney. Aortic Atherosclerosis (ICD10-I70.0). Electronically Signed   By: Virgina Norfolk M.D.   On: 05/17/2019 18:41   DG Chest 2 View  Result Date: 05/17/2019 CLINICAL DATA:  Altered mental status. EXAM: CHEST - 2 VIEW COMPARISON:  February 28, 2017 FINDINGS: Multiple sternal wires and vascular clips are present. Decreased lung volumes are seen which is likely, in part, secondary to suboptimal patient inspiration. Mild, diffuse chronic appearing increased lung markings are noted. There is no evidence of acute infiltrate, pleural effusion or pneumothorax. The cardiac silhouette is moderately enlarged and unchanged in size. A radiopaque left shoulder replacement is seen. Multilevel degenerative changes seen throughout the thoracic spine. IMPRESSION: 1. Evidence of prior median sternotomy/CABG. 2. Chronic-appearing increased lung markings without evidence of acute or active cardiopulmonary disease. Electronically Signed   By: Virgina Norfolk M.D.   On: 05/17/2019 16:46   CT Head Wo Contrast  Result Date: 05/17/2019 CLINICAL DATA:  Altered mental status. EXAM: CT HEAD WITHOUT CONTRAST TECHNIQUE: Contiguous axial images were obtained from the base of the skull through the vertex without intravenous contrast. COMPARISON:  None. FINDINGS: Brain: No evidence of acute infarction, hemorrhage, hydrocephalus, extra-axial collection or mass lesion/mass effect. Vascular: Calcific atherosclerotic disease of the intra cavernous carotid arteries. Skull: Normal. Negative for fracture or focal lesion. Sinuses/Orbits: Chronic right maxillary sinusitis. Other: None. IMPRESSION: 1. No acute intracranial abnormality. Moderate brain parenchymal volume loss. 2. Chronic right maxillary sinusitis. Electronically Signed   By: Fidela Salisbury M.D.   On: 05/17/2019 17:22     A/P: David Hubbard is an 79 y.o. male with acute sigmoid diverticulitis with small focus of gas adjacent to this that is contained  -Agree  with admission -NPO, MIVF -IV abx, preferably Zosyn if no contraindication -We will follow with you - no plans for surgery at this juncture -I spent time today going over everything with his daughter at bedside.  We discussed the relevant anatomy and pathophysiology regarding diverticulitis.  We discussed the overall treatment plan.  Sharon Mt. Dema Severin, M.D. [facility]

## 2019-05-18 ENCOUNTER — Other Ambulatory Visit: Payer: Self-pay

## 2019-05-18 ENCOUNTER — Encounter (HOSPITAL_COMMUNITY): Payer: Self-pay | Admitting: Internal Medicine

## 2019-05-18 DIAGNOSIS — A419 Sepsis, unspecified organism: Secondary | ICD-10-CM

## 2019-05-18 DIAGNOSIS — N183 Chronic kidney disease, stage 3 unspecified: Secondary | ICD-10-CM

## 2019-05-18 DIAGNOSIS — K5792 Diverticulitis of intestine, part unspecified, without perforation or abscess without bleeding: Secondary | ICD-10-CM

## 2019-05-18 DIAGNOSIS — K5780 Diverticulitis of intestine, part unspecified, with perforation and abscess without bleeding: Secondary | ICD-10-CM

## 2019-05-18 LAB — CBC WITH DIFFERENTIAL/PLATELET
Abs Immature Granulocytes: 0.04 10*3/uL (ref 0.00–0.07)
Basophils Absolute: 0 10*3/uL (ref 0.0–0.1)
Basophils Relative: 0 %
Eosinophils Absolute: 0.1 10*3/uL (ref 0.0–0.5)
Eosinophils Relative: 1 %
HCT: 40 % (ref 39.0–52.0)
Hemoglobin: 13.5 g/dL (ref 13.0–17.0)
Immature Granulocytes: 1 %
Lymphocytes Relative: 11 %
Lymphs Abs: 0.9 10*3/uL (ref 0.7–4.0)
MCH: 32 pg (ref 26.0–34.0)
MCHC: 33.8 g/dL (ref 30.0–36.0)
MCV: 94.8 fL (ref 80.0–100.0)
Monocytes Absolute: 1 10*3/uL (ref 0.1–1.0)
Monocytes Relative: 12 %
Neutro Abs: 6.1 10*3/uL (ref 1.7–7.7)
Neutrophils Relative %: 75 %
Platelets: 104 10*3/uL — ABNORMAL LOW (ref 150–400)
RBC: 4.22 MIL/uL (ref 4.22–5.81)
RDW: 14.4 % (ref 11.5–15.5)
WBC: 8.2 10*3/uL (ref 4.0–10.5)
nRBC: 0 % (ref 0.0–0.2)

## 2019-05-18 LAB — URINE CULTURE: Culture: NO GROWTH

## 2019-05-18 LAB — BASIC METABOLIC PANEL
Anion gap: 8 (ref 5–15)
BUN: 12 mg/dL (ref 8–23)
CO2: 25 mmol/L (ref 22–32)
Calcium: 8.8 mg/dL — ABNORMAL LOW (ref 8.9–10.3)
Chloride: 104 mmol/L (ref 98–111)
Creatinine, Ser: 1.08 mg/dL (ref 0.61–1.24)
GFR calc Af Amer: >60 mL/min (ref >60)
GFR calc non Af Amer: >60 mL/min (ref >60)
Glucose, Bld: 114 mg/dL — ABNORMAL HIGH (ref 70–99)
Potassium: 3.5 mmol/L (ref 3.5–5.1)
Sodium: 137 mmol/L (ref 135–145)

## 2019-05-18 LAB — PROCALCITONIN: Procalcitonin: <0.1 ng/mL

## 2019-05-18 LAB — APTT: aPTT: 40 seconds — ABNORMAL HIGH (ref 24–36)

## 2019-05-18 LAB — PROTIME-INR
INR: 1.2 (ref 0.8–1.2)
Prothrombin Time: 14.5 seconds (ref 11.4–15.2)

## 2019-05-18 LAB — TSH: TSH: 2.351 u[IU]/mL (ref 0.350–4.500)

## 2019-05-18 LAB — MAGNESIUM: Magnesium: 1.6 mg/dL — ABNORMAL LOW (ref 1.7–2.4)

## 2019-05-18 LAB — CORTISOL-AM, BLOOD: Cortisol - AM: 14.6 ug/dL (ref 6.7–22.6)

## 2019-05-18 NOTE — Progress Notes (Signed)
PROGRESS NOTE    David Hubbard  [id number] DOB: [birth year] DOA: 05/17/2019 PCP: Audria Nine   Brief Narrative:  David Hubbard is a 79 yo male with PMH diverticulitis (last 02/2017), nephrolithiasis, CKDIII, HLD, HTN, sleep apnea, B-cell lymphoma, depression, CAD s/p CABG, anxiety, chronic LBP who presented to the ER with AMS per his significant other. He was also "leaning" unilaterally at times but this position changed and he did not have focal neuro deficits. He also had no photophobia nor nuchal rigidity.  In the ER he was found to be febrile (101.3, with higher fever reported at home PTA), HR 65, RR 25, SpO2 97% RA, BP 158/79 which further downtrended to 101/68 in the ER. On evaluation in the ER, his daughter is also bedside and she states his mentation is back to baseline and he does have underlying "slowed mentation" and is "forgetful" in nature. He underwent further imaging studies in the ER including CXR which was negative for focal infiltrates.  CT head negative for acute findings but notable for moderate cerebral atrophy and chronic right maxillary sinusitis. CT abdomen/pelvis was also performed which reveals proximal sigmoid diverticulitis with adjacent 1.3 cm focus of contained free air. General surgery was contacted from the ER with plans to evaluate patient however no urgent need for surgery. He did endorse lower abdominal pain on ROS. He was unaware of being febrile at home (possibly due to mentation). He denies nausea or vomiting and denies any diarrhea although his daughter reports that she thought his SO mentioned loose stools but he also has a history of occasional loose stools.    Assessment & Plan:   Active Problems:   Acute diverticulitis   Sepsis 2/2 acute sigmoid diverticulitis Acute intractable abdominal pain, poor PO intake 2/2 above, POA Concurrent dehydration, POA - CT abd/pelvis noted acute sigmoid diverticulitis with 1.3 cm contained free air; RLQ tenderness today  - somewhat easily distractable - lactic 2.0 at admission in the setting of infection/dehydration - s/p rocephin/flagyl in ER - continue on Zosyn monotherapy for abdominal coverage - surgery consulted no indication for Sx currently  Nephrolithiasis, non obstructive - 6 mm nonbobstructing left stone; UA negative - supportive management  Thrombocytopenia, chronic - Baseline 70-100 per chart review Lab Results  Component Value Date   PLT 104 (L) 05/18/2019  - bendamustine more likely to be culprit (see lymphoma)  Memory loss, informal diagnosis of dementia, unspecified - follows with neurology - started on Aricept 04/2019  Diffuse small lymphocytic lymphoma - Completed 4 cycles of bendamustine on 12/12/2018 - Follows outpatient with heme/onc  CAD s/p CABG - Continue asa - Hold BB in setting of hypotension  HTN, essential - Hypotensive in the ER, home meds on hold - Likely 2/2 poor PO intake/dehydration (Continues on IVF)  HLD  - On Repatha at home - Currently NPO/Sips  OSA - RT eval for continuing CPAP  CKD3-A, 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  No home medications.  Database reviewed   DVT prophylaxis: Lovenox Code Status: Full Family Communication: Daughter at bedside  Status is: Inpatient  Dispo: The patient is from: Home              Anticipated d/c is to: Undetermined              Anticipated d/c date is: likely  72+ hours pending clinical course              Patient currently NOT medically stable for discharge given ongoing need for close monitoring, IVF, IV antibiotics, and surgical evaluation.  Consultants:   General surgery  Procedures:   None planned  Antimicrobials:  Ceftriaxine/flagyl in ED x1 05/17/19 Zosyn 05/17/19 --> Ongoing  Subjective: No acute  issues/events overnight. Patient poor historian in the setting of memory loss - denies headache, fevers, chills, nausea or vomiting.  Objective: Vitals:   05/17/19 2215 05/17/19 2319 05/18/19 0221 05/18/19 0509  BP: 132/61 (!) 130/57 122/62 124/74  Pulse: 61 62 (!) 59 62  Resp:  14 16 17   Temp:  99.4 F (37.4 C) 99.1 F (37.3 C) 98.8 F (37.1 C)  TempSrc:  Oral Oral Oral  SpO2: 94% 98% 94% 97%  Weight:  124.4 kg    Height:  6\' 2"  (1.88 m)      Intake/Output Summary (Last 24 hours) at 05/18/2019 0727 Last data filed at 05/18/2019 0545 Gross per 24 hour  Intake 499.2 ml  Output 250 ml  Net 249.2 ml   Filed Weights   05/17/19 1618 05/17/19 2319  Weight: 125 kg 124.4 kg    Examination:  General exam: Appears calm and comfortable  Respiratory system: Clear to auscultation. Respiratory effort normal. Cardiovascular system: S1 & S2 heard, RRR. No JVD, murmurs, rubs, gallops or clicks. No pedal edema. Gastrointestinal system: Abdomen is nondistended, mild tenderness at LLQ otherwise benign without guarding/rebound. Central nervous system: Alert and oriented. No focal neurological deficits. Extremities: Symmetric 5 x 5 power. Skin: No rashes, lesions or ulcers Psychiatry: Judgement and insight appear normal. Mood & affect appropriate.     Data Reviewed: I have personally reviewed following labs and imaging studies  CBC: Recent Labs  Lab 05/17/19 1617 05/18/19 0202  WBC 10.3 8.2  NEUTROABS 7.6 6.1  HGB 15.4 13.5  HCT 46.6 40.0  MCV 94.9 94.8  PLT 123* 123456*   Basic Metabolic Panel: Recent Labs  Lab 05/17/19 1617 05/18/19 0202  NA 139 137  K 4.4 3.5  CL 103 104  CO2 26 25  GLUCOSE 89 114*  BUN 15 12  CREATININE 1.21 1.08  CALCIUM 9.8 8.8*  MG  --  1.6*   GFR: Estimated Creatinine Clearance: 81.6 mL/min (by C-G formula based on SCr of 1.08 mg/dL). Liver Function Tests: Recent Labs  Lab 05/17/19 1617  AST 18  ALT 16  ALKPHOS 80  BILITOT 0.7  PROT 6.4*   ALBUMIN 3.9   No results for input(s): LIPASE, AMYLASE in the last 168 hours. No results for input(s): AMMONIA in the last 168 hours. Coagulation Profile: Recent Labs  Lab 05/17/19 1617 05/18/19 0202  INR 1.1 1.2   Cardiac Enzymes: No results for input(s): CKTOTAL, CKMB, CKMBINDEX, TROPONINI in the last 168 hours. BNP (last 3 results) No results for input(s): PROBNP in the last 8760 hours. HbA1C: No results for input(s): HGBA1C in the last 72 hours. CBG: No results for input(s): GLUCAP in the last 168 hours. Lipid Profile: No results for input(s): CHOL, HDL, LDLCALC, TRIG, CHOLHDL, LDLDIRECT in the last 72 hours. Thyroid Function Tests: Recent Labs    05/18/19 0202  TSH 2.351   Anemia Panel: No results for input(s): VITAMINB12, FOLATE, FERRITIN, TIBC, IRON, RETICCTPCT in the last 72 hours. Sepsis Labs: Recent Labs  Lab 05/17/19 1615 05/18/19 0202  PROCALCITON  --  <0.10  LATICACIDVEN 2.0*  --  Recent Results (from the past 240 hour(s))  Respiratory Panel by RT PCR (Flu A&B, Covid) - Nasopharyngeal Swab     Status: None   Collection Time: 05/17/19  5:52 PM   Specimen: Nasopharyngeal Swab  Result Value Ref Range Status   SARS Coronavirus 2 by RT PCR NEGATIVE NEGATIVE Final    Comment: (NOTE) SARS-CoV-2 target nucleic acids are NOT DETECTED. The SARS-CoV-2 RNA is generally detectable in upper respiratoy specimens during the acute phase of infection. The lowest concentration of SARS-CoV-2 viral copies this assay can detect is 131 copies/mL. A negative result does not preclude SARS-Cov-2 infection and should not be used as the sole basis for treatment or other patient management decisions. A negative result may occur with  improper specimen collection/handling, submission of specimen other than nasopharyngeal swab, presence of viral mutation(s) within the areas targeted by this assay, and inadequate number of viral copies (<131 copies/mL). A negative result must  be combined with clinical observations, patient history, and epidemiological information. The expected result is Negative. Fact Sheet for Patients:  PinkCheek.be Fact Sheet for Healthcare Providers:  GravelBags.it This test is not yet ap proved or cleared by the [place] FDA and  has been authorized for detection and/or diagnosis of SARS-CoV-2 by FDA under an Emergency Use Authorization (EUA). This EUA will remain  in effect (meaning this test can be used) for the duration of the COVID-19 declaration under Section 564(b)(1) of the Act, 21 U.S.C. section 360bbb-3(b)(1), unless the authorization is terminated or revoked sooner.    Influenza A by PCR NEGATIVE NEGATIVE Final   Influenza B by PCR NEGATIVE NEGATIVE Final    Comment: (NOTE) The Xpert Xpress SARS-CoV-2/FLU/RSV assay is intended as an aid in  the diagnosis of influenza from Nasopharyngeal swab specimens and  should not be used as a sole basis for treatment. Nasal washings and  aspirates are unacceptable for Xpert Xpress SARS-CoV-2/FLU/RSV  testing. Fact Sheet for Patients: PinkCheek.be Fact Sheet for Healthcare Providers: GravelBags.it This test is not yet approved or cleared by the [place] FDA and  has been authorized for detection and/or diagnosis of SARS-CoV-2 by  FDA under an Emergency Use Authorization (EUA). This EUA will remain  in effect (meaning this test can be used) for the duration of the  Covid-19 declaration under Section 564(b)(1) of the Act, 21  U.S.C. section 360bbb-3(b)(1), unless the authorization is  terminated or revoked. Performed at [facility], [place] [address], [place], [place] [address]          Radiology Studies: CT ABDOMEN PELVIS WO CONTRAST  Result Date: 05/17/2019 CLINICAL DATA:  Fever. EXAM: CT ABDOMEN AND PELVIS WITHOUT CONTRAST TECHNIQUE: Multidetector  CT imaging of the abdomen and pelvis was performed following the standard protocol without IV contrast. COMPARISON:  August 07, 2018 FINDINGS: Lower chest: Multiple sternal wires are seen. Mild atelectasis is seen within the bilateral lung bases. Hepatobiliary: No focal liver abnormality is seen. No gallstones, gallbladder wall thickening, or biliary dilatation. Pancreas: Unremarkable. No pancreatic ductal dilatation or surrounding inflammatory changes. Spleen: Normal in size without focal abnormality. Adrenals/Urinary Tract: Adrenal glands are unremarkable. Kidneys are normal in size, without focal lesions or hydronephrosis. A 6 mm nonobstructing renal stone is seen within the posterior aspect of the mid left kidney. Bladder is unremarkable. Stomach/Bowel: Stomach is within normal limits. Appendix appears normal. No evidence of bowel dilatation. Markedly inflamed diverticula are seen within the proximal sigmoid colon. An adjacent 1.3 cm focus of contained free air  is seen (axial CT image 85, CT series number 3). Vascular/Lymphatic: Marked severity aortic calcification. No enlarged abdominal or pelvic lymph nodes. Reproductive: The prostate gland is mildly enlarged. Other: No abdominal wall hernia or abnormality. No abdominopelvic ascites. Musculoskeletal: Multilevel degenerative changes seen throughout the lumbar spine. IMPRESSION: 1. Markedly severity sigmoid diverticulitis with an adjacent 1.3 cm focus of contained free air. 2. 6 mm nonobstructing renal stone within the left kidney. Aortic Atherosclerosis (ICD10-I70.0). Electronically Signed   By: Virgina Norfolk M.D.   On: 05/17/2019 18:41   DG Chest 2 View  Result Date: 05/17/2019 CLINICAL DATA:  Altered mental status. EXAM: CHEST - 2 VIEW COMPARISON:  February 28, 2017 FINDINGS: Multiple sternal wires and vascular clips are present. Decreased lung volumes are seen which is likely, in part, secondary to suboptimal patient inspiration. Mild, diffuse chronic  appearing increased lung markings are noted. There is no evidence of acute infiltrate, pleural effusion or pneumothorax. The cardiac silhouette is moderately enlarged and unchanged in size. A radiopaque left shoulder replacement is seen. Multilevel degenerative changes seen throughout the thoracic spine. IMPRESSION: 1. Evidence of prior median sternotomy/CABG. 2. Chronic-appearing increased lung markings without evidence of acute or active cardiopulmonary disease. Electronically Signed   By: Virgina Norfolk M.D.   On: 05/17/2019 16:46   CT Head Wo Contrast  Result Date: 05/17/2019 CLINICAL DATA:  Altered mental status. EXAM: CT HEAD WITHOUT CONTRAST TECHNIQUE: Contiguous axial images were obtained from the base of the skull through the vertex without intravenous contrast. COMPARISON:  None. FINDINGS: Brain: No evidence of acute infarction, hemorrhage, hydrocephalus, extra-axial collection or mass lesion/mass effect. Vascular: Calcific atherosclerotic disease of the intra cavernous carotid arteries. Skull: Normal. Negative for fracture or focal lesion. Sinuses/Orbits: Chronic right maxillary sinusitis. Other: None. IMPRESSION: 1. No acute intracranial abnormality. Moderate brain parenchymal volume loss. 2. Chronic right maxillary sinusitis. Electronically Signed   By: Fidela Salisbury M.D.   On: 05/17/2019 17:22        Scheduled Meds:  donepezil  10 mg Oral QHS   enoxaparin (LOVENOX) injection  40 mg Subcutaneous Q24H   Continuous Infusions:  sodium chloride 75 mL/hr at 05/18/19 0347   piperacillin-tazobactam (ZOSYN)  IV Stopped (05/18/19 0156)     LOS: 1 day   Time spent: 56min  Olumide Dolinger C Kailon Treese, DO [facility]  If 7PM-7AM, please contact night-coverage [email or web address]  05/18/2019, 7:27 AM

## 2019-05-18 NOTE — Progress Notes (Signed)
Subjective: CC: Abdominal pain Patient reports 2/10 abdominal pain in his suprapubic and left lower quadrant.  No nausea or emesis.  He reports he is not passing any flatus.  He is unsure of his last bowel movement. Febrile to 101.3 on presentation yesterday.   Objective: Vital signs in last 24 hours: Temp:  [98 F (36.7 C)-101.3 F (38.5 C)] 98.8 F (37.1 C) (05/10 0509) Pulse Rate:  [59-66] 62 (05/10 0509) Resp:  [14-25] 17 (05/10 0509) BP: (101-158)/(51-98) 124/74 (05/10 0509) SpO2:  [94 %-98 %] 97 % (05/10 0509) Weight:  [124.4 kg-125 kg] 124.4 kg (05/09 2319) Last BM Date: (unknown)  Intake/Output from previous day: 05/09 0701 - 05/10 0700 In: 499.2 [I.V.:362.1; IV Piggyback:137.1] Out: 250 [Urine:250] Intake/Output this shift: No intake/output data recorded.  PE: Gen:  Alert, NAD, pleasant Pulm: Rate and effort normal Abd: Soft, ND, tenderness of the suprapubic > LLQ, +BS Ext:  No LE edema  Psych: A&Ox3 (Time, place, and self) Skin: no rashes noted, warm and dry   Lab Results:  Recent Labs    05/17/19 1617 05/18/19 0202  WBC 10.3 8.2  HGB 15.4 13.5  HCT 46.6 40.0  PLT 123* 104*   BMET Recent Labs    05/17/19 1617 05/18/19 0202  NA 139 137  K 4.4 3.5  CL 103 104  CO2 26 25  GLUCOSE 89 114*  BUN 15 12  CREATININE 1.21 1.08  CALCIUM 9.8 8.8*   PT/INR Recent Labs    05/17/19 1617 05/18/19 0202  LABPROT 13.7 14.5  INR 1.1 1.2   CMP     Component Value Date/Time   NA 137 05/18/2019 0202   K 3.5 05/18/2019 0202   CL 104 05/18/2019 0202   CO2 25 05/18/2019 0202   GLUCOSE 114 (H) 05/18/2019 0202   BUN 12 05/18/2019 0202   CREATININE 1.08 05/18/2019 0202   CREATININE 1.14 02/04/2019 1421   CALCIUM 8.8 (L) 05/18/2019 0202   PROT 6.4 (L) 05/17/2019 1617   ALBUMIN 3.9 05/17/2019 1617   AST 18 05/17/2019 1617   AST 12 (L) 02/04/2019 1421   ALT 16 05/17/2019 1617   ALT 9 02/04/2019 1421   ALKPHOS 80 05/17/2019 1617   BILITOT 0.7  05/17/2019 1617   BILITOT 0.6 02/04/2019 1421   GFRNONAA >60 05/18/2019 0202   GFRNONAA >60 02/04/2019 1421   GFRAA >60 05/18/2019 0202   GFRAA >60 02/04/2019 1421   Lipase  No results found for: LIPASE     Studies/Results: CT ABDOMEN PELVIS WO CONTRAST  Result Date: 05/17/2019 CLINICAL DATA:  Fever. EXAM: CT ABDOMEN AND PELVIS WITHOUT CONTRAST TECHNIQUE: Multidetector CT imaging of the abdomen and pelvis was performed following the standard protocol without IV contrast. COMPARISON:  August 07, 2018 FINDINGS: Lower chest: Multiple sternal wires are seen. Mild atelectasis is seen within the bilateral lung bases. Hepatobiliary: No focal liver abnormality is seen. No gallstones, gallbladder wall thickening, or biliary dilatation. Pancreas: Unremarkable. No pancreatic ductal dilatation or surrounding inflammatory changes. Spleen: Normal in size without focal abnormality. Adrenals/Urinary Tract: Adrenal glands are unremarkable. Kidneys are normal in size, without focal lesions or hydronephrosis. A 6 mm nonobstructing renal stone is seen within the posterior aspect of the mid left kidney. Bladder is unremarkable. Stomach/Bowel: Stomach is within normal limits. Appendix appears normal. No evidence of bowel dilatation. Markedly inflamed diverticula are seen within the proximal sigmoid colon. An adjacent 1.3 cm focus of contained free air is seen (axial CT  image 85, CT series number 3). Vascular/Lymphatic: Marked severity aortic calcification. No enlarged abdominal or pelvic lymph nodes. Reproductive: The prostate gland is mildly enlarged. Other: No abdominal wall hernia or abnormality. No abdominopelvic ascites. Musculoskeletal: Multilevel degenerative changes seen throughout the lumbar spine. IMPRESSION: 1. Markedly severity sigmoid diverticulitis with an adjacent 1.3 cm focus of contained free air. 2. 6 mm nonobstructing renal stone within the left kidney. Aortic Atherosclerosis (ICD10-I70.0). Electronically  Signed   By: Virgina Norfolk M.D.   On: 05/17/2019 18:41   DG Chest 2 View  Result Date: 05/17/2019 CLINICAL DATA:  Altered mental status. EXAM: CHEST - 2 VIEW COMPARISON:  February 28, 2017 FINDINGS: Multiple sternal wires and vascular clips are present. Decreased lung volumes are seen which is likely, in part, secondary to suboptimal patient inspiration. Mild, diffuse chronic appearing increased lung markings are noted. There is no evidence of acute infiltrate, pleural effusion or pneumothorax. The cardiac silhouette is moderately enlarged and unchanged in size. A radiopaque left shoulder replacement is seen. Multilevel degenerative changes seen throughout the thoracic spine. IMPRESSION: 1. Evidence of prior median sternotomy/CABG. 2. Chronic-appearing increased lung markings without evidence of acute or active cardiopulmonary disease. Electronically Signed   By: Virgina Norfolk M.D.   On: 05/17/2019 16:46   CT Head Wo Contrast  Result Date: 05/17/2019 CLINICAL DATA:  Altered mental status. EXAM: CT HEAD WITHOUT CONTRAST TECHNIQUE: Contiguous axial images were obtained from the base of the skull through the vertex without intravenous contrast. COMPARISON:  None. FINDINGS: Brain: No evidence of acute infarction, hemorrhage, hydrocephalus, extra-axial collection or mass lesion/mass effect. Vascular: Calcific atherosclerotic disease of the intra cavernous carotid arteries. Skull: Normal. Negative for fracture or focal lesion. Sinuses/Orbits: Chronic right maxillary sinusitis. Other: None. IMPRESSION: 1. No acute intracranial abnormality. Moderate brain parenchymal volume loss. 2. Chronic right maxillary sinusitis. Electronically Signed   By: Fidela Salisbury M.D.   On: 05/17/2019 17:22    Anti-infectives: Anti-infectives (From admission, onward)   Start     Dose/Rate Route Frequency Ordered Stop   05/18/19 0615  vancomycin (VANCOREADY) IVPB 1500 mg/300 mL  Status:  Discontinued     1,500 mg 150  mL/hr over 120 Minutes Intravenous Every 12 hours 05/17/19 1801 05/17/19 1806   05/18/19 0600  cefTRIAXone (ROCEPHIN) 2 g in sodium chloride 0.9 % 100 mL IVPB  Status:  Discontinued     2 g 200 mL/hr over 30 Minutes Intravenous Every 12 hours 05/17/19 1801 05/17/19 1806   05/17/19 2200  piperacillin-tazobactam (ZOSYN) IVPB 3.375 g     3.375 g 12.5 mL/hr over 240 Minutes Intravenous Every 8 hours 05/17/19 1932     05/17/19 2045  ceFEPIme (MAXIPIME) 2 g in sodium chloride 0.9 % 100 mL IVPB  Status:  Discontinued     2 g 200 mL/hr over 30 Minutes Intravenous  Once 05/17/19 2033 05/17/19 2034   05/17/19 2045  metroNIDAZOLE (FLAGYL) IVPB 500 mg  Status:  Discontinued     500 mg 100 mL/hr over 60 Minutes Intravenous Every 8 hours 05/17/19 2033 05/17/19 2034   05/17/19 2000  ampicillin (OMNIPEN) 2 g in sodium chloride 0.9 % 100 mL IVPB  Status:  Discontinued     2 g 300 mL/hr over 20 Minutes Intravenous Every 4 hours 05/17/19 1801 05/17/19 1806   05/17/19 1815  metroNIDAZOLE (FLAGYL) IVPB 500 mg     500 mg 100 mL/hr over 60 Minutes Intravenous  Once 05/17/19 1801 05/17/19 1942   05/17/19 1815  cefTRIAXone (ROCEPHIN)  1 g in sodium chloride 0.9 % 100 mL IVPB     1 g 200 mL/hr over 30 Minutes Intravenous  Once 05/17/19 1801 05/17/19 1909   05/17/19 1815  acyclovir (ZOVIRAX) 995 mg in dextrose 5 % 150 mL IVPB  Status:  Discontinued     10 mg/kg  99.3 kg (Adjusted) 169.9 mL/hr over 60 Minutes Intravenous Every 8 hours 05/17/19 1801 05/17/19 1806   05/17/19 1800  vancomycin (VANCOCIN) 2,500 mg in sodium chloride 0.9 % 500 mL IVPB  Status:  Discontinued     2,500 mg 250 mL/hr over 120 Minutes Intravenous  Once 05/17/19 1801 05/17/19 1806   05/17/19 1630  cefTRIAXone (ROCEPHIN) 1 g in sodium chloride 0.9 % 100 mL IVPB     1 g 200 mL/hr over 30 Minutes Intravenous  Once 05/17/19 1625 05/17/19 1722       Assessment/Plan HTN HLD CAD s/p CABG  Acute sigmoid diverticulitis with small focus of  gas adjacent to this that is contained - Cont IV abx - Cont bowel rest, IVF - No current plans for emergency surgery at this time. We will continue to follow along with you  FEN - NPO, IVF VTE - SCDs, Lovenox ID - Zosyn 5/9 >> WBC 8.2 from 10.3    LOS: 1 day    Jillyn Ledger , [facility] 05/18/2019, 8:41 AM Please see Amion for pager number during day hours 7:00am-4:30pm

## 2019-05-18 NOTE — Progress Notes (Signed)
Patient refused CPAP for tonight. RT informed patient to have RT called if he changes his mind. RT will monitor as needed. 

## 2019-05-18 NOTE — Plan of Care (Signed)
  Problem: Education: Goal: Knowledge of General Education information will improve Description Including pain rating scale, medication(s)/side effects and non-pharmacologic comfort measures Outcome: Progressing   

## 2019-05-18 NOTE — Plan of Care (Signed)
  Problem: Education: Goal: Knowledge of General Education information will improve Description: Including pain rating scale, medication(s)/side effects and non-pharmacologic comfort measures Outcome: Progressing   Problem: Health Behavior/Discharge Planning: Goal: Ability to manage health-related needs will improve Outcome: Progressing   Problem: Clinical Measurements: Goal: Ability to maintain clinical measurements within normal limits will improve Outcome: Progressing   Problem: Elimination: Goal: Will not experience complications related to urinary retention Outcome: Progressing   Problem: Pain Managment: Goal: General experience of comfort will improve Outcome: Progressing   Problem: Safety: Goal: Ability to remain free from injury will improve Outcome: Progressing   Problem: Skin Integrity: Goal: Risk for impaired skin integrity will decrease Outcome: Progressing   

## 2019-05-19 LAB — COMPREHENSIVE METABOLIC PANEL
ALT: 9 U/L (ref 0–44)
AST: 14 U/L — ABNORMAL LOW (ref 15–41)
Albumin: 2.9 g/dL — ABNORMAL LOW (ref 3.5–5.0)
Alkaline Phosphatase: 55 U/L (ref 38–126)
Anion gap: 9 (ref 5–15)
BUN: 9 mg/dL (ref 8–23)
CO2: 21 mmol/L — ABNORMAL LOW (ref 22–32)
Calcium: 8.6 mg/dL — ABNORMAL LOW (ref 8.9–10.3)
Chloride: 105 mmol/L (ref 98–111)
Creatinine, Ser: 0.99 mg/dL (ref 0.61–1.24)
GFR calc Af Amer: >60 mL/min (ref >60)
GFR calc non Af Amer: >60 mL/min (ref >60)
Glucose, Bld: 95 mg/dL (ref 70–99)
Potassium: 3.5 mmol/L (ref 3.5–5.1)
Sodium: 135 mmol/L (ref 135–145)
Total Bilirubin: 1.1 mg/dL (ref 0.3–1.2)
Total Protein: 5.3 g/dL — ABNORMAL LOW (ref 6.5–8.1)

## 2019-05-19 LAB — CBC
HCT: 39.5 % (ref 39.0–52.0)
Hemoglobin: 13.5 g/dL (ref 13.0–17.0)
MCH: 32.5 pg (ref 26.0–34.0)
MCHC: 34.2 g/dL (ref 30.0–36.0)
MCV: 95 fL (ref 80.0–100.0)
Platelets: 98 10*3/uL — ABNORMAL LOW (ref 150–400)
RBC: 4.16 MIL/uL — ABNORMAL LOW (ref 4.22–5.81)
RDW: 13.8 % (ref 11.5–15.5)
WBC: 7.5 10*3/uL (ref 4.0–10.5)
nRBC: 0 % (ref 0.0–0.2)

## 2019-05-19 MED ORDER — OXYCODONE HCL 5 MG PO TABS: 5.0000 mg | ORAL_TABLET | ORAL | Status: DC | PRN

## 2019-05-19 NOTE — Progress Notes (Signed)
Subjective: CC: No abdominal pain, n/v. Denies flatus or bm.   Objective: Vital signs in last 24 hours: Temp:  [98.1 F (36.7 C)-99.4 F (37.4 C)] 98.5 F (36.9 C) (05/11 0603) Pulse Rate:  [58-70] 58 (05/11 0603) Resp:  [16-18] 18 (05/11 0603) BP: (132-147)/(65-76) 145/67 (05/11 0603) SpO2:  [92 %-97 %] 95 % (05/11 0603) Last BM Date: 05/18/19  Intake/Output from previous day: 05/10 0701 - 05/11 0700 In: 1440.3 [I.V.:1397.5; IV Piggyback:42.7] Out: 1500 [Urine:1500] Intake/Output this shift: No intake/output data recorded.  PE: Gen:  Alert, NAD, pleasant Pulm: Rate and effort normal Abd: Soft, ND, tenderness of the suprapubic > LLQ that is slightly improved from yesterday. +BS Ext:  No LE edema  Psych: A&Ox3 (Time, place, and self) Skin: no rashes noted, warm and dry  Lab Results:  Recent Labs    05/18/19 0202 05/19/19 0150  WBC 8.2 7.5  HGB 13.5 13.5  HCT 40.0 39.5  PLT 104* 98*   BMET Recent Labs    05/18/19 0202 05/19/19 0150  NA 137 135  K 3.5 3.5  CL 104 105  CO2 25 21*  GLUCOSE 114* 95  BUN 12 9  CREATININE 1.08 0.99  CALCIUM 8.8* 8.6*   PT/INR Recent Labs    05/17/19 1617 05/18/19 0202  LABPROT 13.7 14.5  INR 1.1 1.2   CMP     Component Value Date/Time   NA 135 05/19/2019 0150   K 3.5 05/19/2019 0150   CL 105 05/19/2019 0150   CO2 21 (L) 05/19/2019 0150   GLUCOSE 95 05/19/2019 0150   BUN 9 05/19/2019 0150   CREATININE 0.99 05/19/2019 0150   CREATININE 1.14 02/04/2019 1421   CALCIUM 8.6 (L) 05/19/2019 0150   PROT 5.3 (L) 05/19/2019 0150   ALBUMIN 2.9 (L) 05/19/2019 0150   AST 14 (L) 05/19/2019 0150   AST 12 (L) 02/04/2019 1421   ALT 9 05/19/2019 0150   ALT 9 02/04/2019 1421   ALKPHOS 55 05/19/2019 0150   BILITOT 1.1 05/19/2019 0150   BILITOT 0.6 02/04/2019 1421   GFRNONAA >60 05/19/2019 0150   GFRNONAA >60 02/04/2019 1421   GFRAA >60 05/19/2019 0150   GFRAA >60 02/04/2019 1421   Lipase  No results found for:  LIPASE     Studies/Results: CT ABDOMEN PELVIS WO CONTRAST  Result Date: 05/17/2019 CLINICAL DATA:  Fever. EXAM: CT ABDOMEN AND PELVIS WITHOUT CONTRAST TECHNIQUE: Multidetector CT imaging of the abdomen and pelvis was performed following the standard protocol without IV contrast. COMPARISON:  August 07, 2018 FINDINGS: Lower chest: Multiple sternal wires are seen. Mild atelectasis is seen within the bilateral lung bases. Hepatobiliary: No focal liver abnormality is seen. No gallstones, gallbladder wall thickening, or biliary dilatation. Pancreas: Unremarkable. No pancreatic ductal dilatation or surrounding inflammatory changes. Spleen: Normal in size without focal abnormality. Adrenals/Urinary Tract: Adrenal glands are unremarkable. Kidneys are normal in size, without focal lesions or hydronephrosis. A 6 mm nonobstructing renal stone is seen within the posterior aspect of the mid left kidney. Bladder is unremarkable. Stomach/Bowel: Stomach is within normal limits. Appendix appears normal. No evidence of bowel dilatation. Markedly inflamed diverticula are seen within the proximal sigmoid colon. An adjacent 1.3 cm focus of contained free air is seen (axial CT image 85, CT series number 3). Vascular/Lymphatic: Marked severity aortic calcification. No enlarged abdominal or pelvic lymph nodes. Reproductive: The prostate gland is mildly enlarged. Other: No abdominal wall hernia or abnormality. No abdominopelvic ascites. Musculoskeletal: Multilevel  degenerative changes seen throughout the lumbar spine. IMPRESSION: 1. Markedly severity sigmoid diverticulitis with an adjacent 1.3 cm focus of contained free air. 2. 6 mm nonobstructing renal stone within the left kidney. Aortic Atherosclerosis (ICD10-I70.0). Electronically Signed   By: Virgina Norfolk M.D.   On: 05/17/2019 18:41   DG Chest 2 View  Result Date: 05/17/2019 CLINICAL DATA:  Altered mental status. EXAM: CHEST - 2 VIEW COMPARISON:  February 28, 2017  FINDINGS: Multiple sternal wires and vascular clips are present. Decreased lung volumes are seen which is likely, in part, secondary to suboptimal patient inspiration. Mild, diffuse chronic appearing increased lung markings are noted. There is no evidence of acute infiltrate, pleural effusion or pneumothorax. The cardiac silhouette is moderately enlarged and unchanged in size. A radiopaque left shoulder replacement is seen. Multilevel degenerative changes seen throughout the thoracic spine. IMPRESSION: 1. Evidence of prior median sternotomy/CABG. 2. Chronic-appearing increased lung markings without evidence of acute or active cardiopulmonary disease. Electronically Signed   By: Virgina Norfolk M.D.   On: 05/17/2019 16:46   CT Head Wo Contrast  Result Date: 05/17/2019 CLINICAL DATA:  Altered mental status. EXAM: CT HEAD WITHOUT CONTRAST TECHNIQUE: Contiguous axial images were obtained from the base of the skull through the vertex without intravenous contrast. COMPARISON:  None. FINDINGS: Brain: No evidence of acute infarction, hemorrhage, hydrocephalus, extra-axial collection or mass lesion/mass effect. Vascular: Calcific atherosclerotic disease of the intra cavernous carotid arteries. Skull: Normal. Negative for fracture or focal lesion. Sinuses/Orbits: Chronic right maxillary sinusitis. Other: None. IMPRESSION: 1. No acute intracranial abnormality. Moderate brain parenchymal volume loss. 2. Chronic right maxillary sinusitis. Electronically Signed   By: Fidela Salisbury M.D.   On: 05/17/2019 17:22    Anti-infectives: Anti-infectives (From admission, onward)   Start     Dose/Rate Route Frequency Ordered Stop   05/18/19 0615  vancomycin (VANCOREADY) IVPB 1500 mg/300 mL  Status:  Discontinued     1,500 mg 150 mL/hr over 120 Minutes Intravenous Every 12 hours 05/17/19 1801 05/17/19 1806   05/18/19 0600  cefTRIAXone (ROCEPHIN) 2 g in sodium chloride 0.9 % 100 mL IVPB  Status:  Discontinued     2 g 200  mL/hr over 30 Minutes Intravenous Every 12 hours 05/17/19 1801 05/17/19 1806   05/17/19 2200  piperacillin-tazobactam (ZOSYN) IVPB 3.375 g     3.375 g 12.5 mL/hr over 240 Minutes Intravenous Every 8 hours 05/17/19 1932     05/17/19 2045  ceFEPIme (MAXIPIME) 2 g in sodium chloride 0.9 % 100 mL IVPB  Status:  Discontinued     2 g 200 mL/hr over 30 Minutes Intravenous  Once 05/17/19 2033 05/17/19 2034   05/17/19 2045  metroNIDAZOLE (FLAGYL) IVPB 500 mg  Status:  Discontinued     500 mg 100 mL/hr over 60 Minutes Intravenous Every 8 hours 05/17/19 2033 05/17/19 2034   05/17/19 2000  ampicillin (OMNIPEN) 2 g in sodium chloride 0.9 % 100 mL IVPB  Status:  Discontinued     2 g 300 mL/hr over 20 Minutes Intravenous Every 4 hours 05/17/19 1801 05/17/19 1806   05/17/19 1815  metroNIDAZOLE (FLAGYL) IVPB 500 mg     500 mg 100 mL/hr over 60 Minutes Intravenous  Once 05/17/19 1801 05/17/19 1942   05/17/19 1815  cefTRIAXone (ROCEPHIN) 1 g in sodium chloride 0.9 % 100 mL IVPB     1 g 200 mL/hr over 30 Minutes Intravenous  Once 05/17/19 1801 05/17/19 1909   05/17/19 1815  acyclovir (ZOVIRAX) 995 mg  in dextrose 5 % 150 mL IVPB  Status:  Discontinued     10 mg/kg  99.3 kg (Adjusted) 169.9 mL/hr over 60 Minutes Intravenous Every 8 hours 05/17/19 1801 05/17/19 1806   05/17/19 1800  vancomycin (VANCOCIN) 2,500 mg in sodium chloride 0.9 % 500 mL IVPB  Status:  Discontinued     2,500 mg 250 mL/hr over 120 Minutes Intravenous  Once 05/17/19 1801 05/17/19 1806   05/17/19 1630  cefTRIAXone (ROCEPHIN) 1 g in sodium chloride 0.9 % 100 mL IVPB     1 g 200 mL/hr over 30 Minutes Intravenous  Once 05/17/19 1625 05/17/19 1722       Assessment/Plan HTN HLD CAD s/p CABG  Acute sigmoid diverticulitis with small focus of gas adjacent to this that is contained - Cont IV abx - Allow sips of clears - No current plans for emergency surgery at this time. We will continue to follow along with you  FEN - Sips and  chips, IVF VTE - SCDs, Lovenox ID - Zosyn 5/9 >> WBC wnl at 7.5   LOS: 2 days    Jillyn Ledger , [facility] 05/19/2019, 9:20 AM Please see Amion for pager number during day hours 7:00am-4:30pm

## 2019-05-19 NOTE — Plan of Care (Signed)
  Problem: Education: Goal: Knowledge of General Education information will improve Description Including pain rating scale, medication(s)/side effects and non-pharmacologic comfort measures Outcome: Progressing   

## 2019-05-19 NOTE — Progress Notes (Addendum)
PROGRESS NOTE    David Hubbard  [id number] DOB: 12-23-1940 DOA: 05/17/2019 PCP: Audria Nine   Brief Narrative:  David Hubbard is a 79 yo male with PMH diverticulitis (last 02/2017), nephrolithiasis, CKDIII, HLD, HTN, sleep apnea, B-cell lymphoma, depression, CAD s/p CABG, anxiety, chronic LBP who presented to the ER with AMS per his significant other. He was also "leaning" unilaterally at times but this position changed and he did not have focal neuro deficits. He also had no photophobia nor nuchal rigidity.  In the ER he was found to be febrile (101.3, with higher fever reported at home PTA), HR 65, RR 25, SpO2 97% RA, BP 158/79 which further downtrended to 101/68 in the ER. On evaluation in the ER, his daughter is also bedside and she states his mentation is back to baseline and he does have underlying "slowed mentation" and is "forgetful" in nature. He underwent further imaging studies in the ER including CXR which was negative for focal infiltrates.  CT head negative for acute findings but notable for moderate cerebral atrophy and chronic right maxillary sinusitis. CT abdomen/pelvis was also performed which reveals proximal sigmoid diverticulitis with adjacent 1.3 cm focus of contained free air. General surgery was contacted from the ER with plans to evaluate patient however no urgent need for surgery. He did endorse lower abdominal pain on ROS. He was unaware of being febrile at home (possibly due to mentation). He denies nausea or vomiting and denies any diarrhea although his daughter reports that she thought his SO mentioned loose stools but he also has a history of occasional loose stools.    Assessment & Plan:   Principal Problem:   Acute diverticulitis Active Problems:   Sepsis ([place])   Perforated diverticulum   CKD (chronic kidney disease), stage III   Sepsis 2/2 acute sigmoid diverticulitis, POA Acute intractable abdominal pain, poor PO intake 2/2 above, POA Concurrent  dehydration, POA - CT abd/pelvis noted acute sigmoid diverticulitis with 1.3 cm contained free air - lactic 2.0 at admission in the setting of infection/dehydration - s/p rocephin/flagyl in ER - continue on Zosyn monotherapy for abdominal coverage - surgery consulted no indication for Sx currently - Abdominal pain markedly improving -advance diet per surgical recommendations -currently on sips of clears  Nephrolithiasis, non obstructive - 6 mm nonbobstructing left stone; UA negative - supportive management  Thrombocytopenia, chronic - Baseline 70-100 per chart review Lab Results  Component Value Date   PLT 98 (L) 05/19/2019  - bendamustine more likely to be culprit given history (see lymphoma)  Memory loss, informal diagnosis of dementia, unspecified - follows with neurology - started on Aricept 04/2019  Diffuse small lymphocytic lymphoma - Completed 4 cycles of bendamustine on 12/12/2018 - Follows outpatient with heme/onc  CAD s/p CABG - Continue asa - Hold BB in setting of hypotension  HTN, essential - Hypotensive in the ER, home meds on hold - Likely 2/2 poor PO intake/dehydration (Continues on IVF)  HLD  - On Repatha at home - Currently NPO/Sips  OSA - RT eval for continuing CPAP  CKD3-A, without AKI  -Baseline creatinine approximately 1.2 Lab Results  Component Value Date   CREATININE 0.99 05/19/2019   CREATININE 1.08 05/18/2019   CREATININE 1.21 05/17/2019   Depression/anxiety -Mood appropriate.  No home medications.  Database reviewed  DVT prophylaxis: Lovenox Code Status: Full Family Communication: Daughter  Status is: Inpatient  Dispo: The patient is from: Home  Anticipated d/c is to: Undetermined              Anticipated d/c date is: likely 48-72 hours pending clinical course              Patient currently NOT medically stable for discharge given ongoing need for close monitoring, IVF, IV antibiotics, and surgical  evaluation.  Consultants:   General surgery  Procedures:   None planned  Antimicrobials:  Ceftriaxine/flagyl in ED x1 05/17/19 Zosyn 05/17/19 --> Ongoing  Subjective: No acute issues/events overnight. Patient poor historian in the setting of memory loss - denies headache, fevers, chills, nausea or vomiting.  Only complaint today is that he would like a diet.  Objective: Vitals:   05/18/19 0509 05/18/19 1353 05/18/19 2106 05/19/19 0603  BP: 124/74 132/65 (!) 147/76 (!) 145/67  Pulse: 62 66 70 (!) 58  Resp: 17 16 18 18   Temp: 98.8 F (37.1 C) 98.1 F (36.7 C) 99.4 F (37.4 C) 98.5 F (36.9 C)  TempSrc: Oral Oral Oral Oral  SpO2: 97% 97% 92% 95%  Weight:      Height:        Intake/Output Summary (Last 24 hours) at 05/19/2019 0732 Last data filed at 05/19/2019 0606 Gross per 24 hour  Intake 1440.26 ml  Output 1500 ml  Net -59.74 ml   Filed Weights   05/17/19 1618 05/17/19 2319  Weight: 125 kg 124.4 kg    Examination:  General exam: Appears calm and comfortable  Respiratory system: Clear to auscultation. Respiratory effort normal. Cardiovascular system: S1 & S2 heard, RRR. No JVD, murmurs, rubs, gallops or clicks. No pedal edema. Gastrointestinal system: Abdomen is nondistended, mild tenderness at LLQ otherwise benign without guarding/rebound. Central nervous system: Alert and oriented. No focal neurological deficits. Extremities: Symmetric 5 x 5 power. Skin: No rashes, lesions or ulcers Psychiatry: Judgement and insight appear normal. Mood & affect appropriate.     Data Reviewed: I have personally reviewed following labs and imaging studies  CBC: Recent Labs  Lab 05/17/19 1617 05/18/19 0202 05/19/19 0150  WBC 10.3 8.2 7.5  NEUTROABS 7.6 6.1  --   HGB 15.4 13.5 13.5  HCT 46.6 40.0 39.5  MCV 94.9 94.8 95.0  PLT 123* 104* 98*   Basic Metabolic Panel: Recent Labs  Lab 05/17/19 1617 05/18/19 0202 05/19/19 0150  NA 139 137 135  K 4.4 3.5 3.5  CL 103  104 105  CO2 26 25 21*  GLUCOSE 89 114* 95  BUN 15 12 9   CREATININE 1.21 1.08 0.99  CALCIUM 9.8 8.8* 8.6*  MG  --  1.6*  --    GFR: Estimated Creatinine Clearance: 89 mL/min (by C-G formula based on SCr of 0.99 mg/dL). Liver Function Tests: Recent Labs  Lab 05/17/19 1617 05/19/19 0150  AST 18 14*  ALT 16 9  ALKPHOS 80 55  BILITOT 0.7 1.1  PROT 6.4* 5.3*  ALBUMIN 3.9 2.9*   No results for input(s): LIPASE, AMYLASE in the last 168 hours. No results for input(s): AMMONIA in the last 168 hours. Coagulation Profile: Recent Labs  Lab 05/17/19 1617 05/18/19 0202  INR 1.1 1.2   Cardiac Enzymes: No results for input(s): CKTOTAL, CKMB, CKMBINDEX, TROPONINI in the last 168 hours. BNP (last 3 results) No results for input(s): PROBNP in the last 8760 hours. HbA1C: No results for input(s): HGBA1C in the last 72 hours. CBG: No results for input(s): GLUCAP in the last 168 hours. Lipid Profile: No results for input(s): CHOL, HDL,  LDLCALC, TRIG, CHOLHDL, LDLDIRECT in the last 72 hours. Thyroid Function Tests: Recent Labs    05/18/19 0202  TSH 2.351   Anemia Panel: No results for input(s): VITAMINB12, FOLATE, FERRITIN, TIBC, IRON, RETICCTPCT in the last 72 hours. Sepsis Labs: Recent Labs  Lab 05/17/19 1615 05/18/19 0202  PROCALCITON  --  <0.10  LATICACIDVEN 2.0*  --     Recent Results (from the past 240 hour(s))  Culture, blood (Routine x 2)     Status: None (Preliminary result)   Collection Time: 05/17/19  4:00 PM   Specimen: BLOOD RIGHT FOREARM  Result Value Ref Range Status   Specimen Description BLOOD RIGHT FOREARM  Final   Special Requests   Final    BOTTLES DRAWN AEROBIC AND ANAEROBIC Blood Culture adequate volume   Culture   Final    NO GROWTH 2 DAYS Performed at [facility], [place] [address], [place], [place] [address]    Report Status PENDING  Incomplete  Urine culture     Status: None   Collection Time: 05/17/19  4:00 PM   Specimen: Urine,  Catheterized  Result Value Ref Range Status   Specimen Description URINE, CATHETERIZED  Final   Special Requests NONE  Final   Culture   Final    NO GROWTH Performed at [facility], [place] [address] [place], [place] [address]    Report Status 05/18/2019 FINAL  Final  Culture, blood (Routine x 2)     Status: None (Preliminary result)   Collection Time: 05/17/19  4:12 PM   Specimen: BLOOD LEFT FOREARM  Result Value Ref Range Status   Specimen Description BLOOD LEFT FOREARM  Final   Special Requests   Final    BOTTLES DRAWN AEROBIC AND ANAEROBIC Blood Culture results may not be optimal due to an inadequate volume of blood received in culture bottles   Culture   Final    NO GROWTH 2 DAYS Performed at [facility], [place] [address] [place],  [address]    Report Status PENDING  Incomplete  Respiratory Panel by RT PCR (Flu A&B, Covid) - Nasopharyngeal Swab     Status: None   Collection Time: 05/17/19  5:52 PM   Specimen: Nasopharyngeal Swab  Result Value Ref Range Status   SARS Coronavirus 2 by RT PCR NEGATIVE NEGATIVE Final    Comment: (NOTE) SARS-CoV-2 target nucleic acids are NOT DETECTED. The SARS-CoV-2 RNA is generally detectable in upper respiratoy specimens during the acute phase of infection. The lowest concentration of SARS-CoV-2 viral copies this assay can detect is 131 copies/mL. A negative result does not preclude SARS-Cov-2 infection and should not be used as the sole basis for treatment or other patient management decisions. A negative result may occur with  improper specimen collection/handling, submission of specimen other than nasopharyngeal swab, presence of viral mutation(s) within the areas targeted by this assay, and inadequate number of viral copies (<131 copies/mL). A negative result must be combined with clinical observations, patient history, and epidemiological information. The expected result is Negative. Fact Sheet for Patients:   PinkCheek.be Fact Sheet for Healthcare Providers:  GravelBags.it This test is not yet ap proved or cleared by the [place] FDA and  has been authorized for detection and/or diagnosis of SARS-CoV-2 by FDA under an Emergency Use Authorization (EUA). This EUA will remain  in effect (meaning this test can be used) for the duration of the COVID-19 declaration under Section 564(b)(1) of the Act, 21 U.S.C.  section 360bbb-3(b)(1), unless the authorization is terminated or revoked sooner.    Influenza A by PCR NEGATIVE NEGATIVE Final   Influenza B by PCR NEGATIVE NEGATIVE Final    Comment: (NOTE) The Xpert Xpress SARS-CoV-2/FLU/RSV assay is intended as an aid in  the diagnosis of influenza from Nasopharyngeal swab specimens and  should not be used as a sole basis for treatment. Nasal washings and  aspirates are unacceptable for Xpert Xpress SARS-CoV-2/FLU/RSV  testing. Fact Sheet for Patients: PinkCheek.be Fact Sheet for Healthcare Providers: GravelBags.it This test is not yet approved or cleared by the [place] FDA and  has been authorized for detection and/or diagnosis of SARS-CoV-2 by  FDA under an Emergency Use Authorization (EUA). This EUA will remain  in effect (meaning this test can be used) for the duration of the  Covid-19 declaration under Section 564(b)(1) of the Act, 21  U.S.C. section 360bbb-3(b)(1), unless the authorization is  terminated or revoked. Performed at [facility], [place] [address] [place], [place] [address]          Radiology Studies: CT ABDOMEN PELVIS WO CONTRAST  Result Date: 05/17/2019 CLINICAL DATA:  Fever. EXAM: CT ABDOMEN AND PELVIS WITHOUT CONTRAST TECHNIQUE: Multidetector CT imaging of the abdomen and pelvis was performed following the standard protocol without IV contrast. COMPARISON:  August 07, 2018 FINDINGS: Lower  chest: Multiple sternal wires are seen. Mild atelectasis is seen within the bilateral lung bases. Hepatobiliary: No focal liver abnormality is seen. No gallstones, gallbladder wall thickening, or biliary dilatation. Pancreas: Unremarkable. No pancreatic ductal dilatation or surrounding inflammatory changes. Spleen: Normal in size without focal abnormality. Adrenals/Urinary Tract: Adrenal glands are unremarkable. Kidneys are normal in size, without focal lesions or hydronephrosis. A 6 mm nonobstructing renal stone is seen within the posterior aspect of the mid left kidney. Bladder is unremarkable. Stomach/Bowel: Stomach is within normal limits. Appendix appears normal. No evidence of bowel dilatation. Markedly inflamed diverticula are seen within the proximal sigmoid colon. An adjacent 1.3 cm focus of contained free air is seen (axial CT image 85, CT series number 3). Vascular/Lymphatic: Marked severity aortic calcification. No enlarged abdominal or pelvic lymph nodes. Reproductive: The prostate gland is mildly enlarged. Other: No abdominal wall hernia or abnormality. No abdominopelvic ascites. Musculoskeletal: Multilevel degenerative changes seen throughout the lumbar spine. IMPRESSION: 1. Markedly severity sigmoid diverticulitis with an adjacent 1.3 cm focus of contained free air. 2. 6 mm nonobstructing renal stone within the left kidney. Aortic Atherosclerosis (ICD10-I70.0). Electronically Signed   By: Virgina Norfolk M.D.   On: 05/17/2019 18:41   DG Chest 2 View  Result Date: 05/17/2019 CLINICAL DATA:  Altered mental status. EXAM: CHEST - 2 VIEW COMPARISON:  February 28, 2017 FINDINGS: Multiple sternal wires and vascular clips are present. Decreased lung volumes are seen which is likely, in part, secondary to suboptimal patient inspiration. Mild, diffuse chronic appearing increased lung markings are noted. There is no evidence of acute infiltrate, pleural effusion or pneumothorax. The cardiac silhouette is  moderately enlarged and unchanged in size. A radiopaque left shoulder replacement is seen. Multilevel degenerative changes seen throughout the thoracic spine. IMPRESSION: 1. Evidence of prior median sternotomy/CABG. 2. Chronic-appearing increased lung markings without evidence of acute or active cardiopulmonary disease. Electronically Signed   By: Virgina Norfolk M.D.   On: 05/17/2019 16:46   CT Head Wo Contrast  Result Date: 05/17/2019 CLINICAL DATA:  Altered mental status. EXAM: CT HEAD WITHOUT CONTRAST TECHNIQUE: Contiguous axial images were obtained from the base  of the skull through the vertex without intravenous contrast. COMPARISON:  None. FINDINGS: Brain: No evidence of acute infarction, hemorrhage, hydrocephalus, extra-axial collection or mass lesion/mass effect. Vascular: Calcific atherosclerotic disease of the intra cavernous carotid arteries. Skull: Normal. Negative for fracture or focal lesion. Sinuses/Orbits: Chronic right maxillary sinusitis. Other: None. IMPRESSION: 1. No acute intracranial abnormality. Moderate brain parenchymal volume loss. 2. Chronic right maxillary sinusitis. Electronically Signed   By: Fidela Salisbury M.D.   On: 05/17/2019 17:22        Scheduled Meds: . donepezil  10 mg Oral QHS  . enoxaparin (LOVENOX) injection  40 mg Subcutaneous Q24H   Continuous Infusions: . sodium chloride 75 mL/hr at 05/19/19 0606  . piperacillin-tazobactam (ZOSYN)  IV 3.375 g (05/19/19 0606)     LOS: 2 days   Time spent: 51min  Caytlyn Evers C Lurline Caver, DO [facility]  If 7PM-7AM, please contact night-coverage [email or web address]  05/19/2019, 7:32 AM

## 2019-05-19 NOTE — Progress Notes (Signed)
Patient refused CPAP for tonight. Patient stated he does not wear one at home. RT instructed patient to have RT called if he changes his mind. RT will monitor as needed.

## 2019-05-20 ENCOUNTER — Other Ambulatory Visit: Payer: Self-pay | Admitting: Hematology & Oncology

## 2019-05-20 LAB — CBC
HCT: 38.1 % — ABNORMAL LOW (ref 39.0–52.0)
Hemoglobin: 13 g/dL (ref 13.0–17.0)
MCH: 31.9 pg (ref 26.0–34.0)
MCHC: 34.1 g/dL (ref 30.0–36.0)
MCV: 93.4 fL (ref 80.0–100.0)
Platelets: 110 10*3/uL — ABNORMAL LOW (ref 150–400)
RBC: 4.08 MIL/uL — ABNORMAL LOW (ref 4.22–5.81)
RDW: 13.8 % (ref 11.5–15.5)
WBC: 5.8 10*3/uL (ref 4.0–10.5)
nRBC: 0 % (ref 0.0–0.2)

## 2019-05-20 LAB — COMPREHENSIVE METABOLIC PANEL
ALT: 11 U/L (ref 0–44)
AST: 11 U/L — ABNORMAL LOW (ref 15–41)
Albumin: 2.8 g/dL — ABNORMAL LOW (ref 3.5–5.0)
Alkaline Phosphatase: 52 U/L (ref 38–126)
Anion gap: 12 (ref 5–15)
BUN: 9 mg/dL (ref 8–23)
CO2: 22 mmol/L (ref 22–32)
Calcium: 8.8 mg/dL — ABNORMAL LOW (ref 8.9–10.3)
Chloride: 104 mmol/L (ref 98–111)
Creatinine, Ser: 0.94 mg/dL (ref 0.61–1.24)
GFR calc Af Amer: >60 mL/min (ref >60)
GFR calc non Af Amer: >60 mL/min (ref >60)
Glucose, Bld: 92 mg/dL (ref 70–99)
Potassium: 3.5 mmol/L (ref 3.5–5.1)
Sodium: 138 mmol/L (ref 135–145)
Total Bilirubin: 1.2 mg/dL (ref 0.3–1.2)
Total Protein: 5.4 g/dL — ABNORMAL LOW (ref 6.5–8.1)

## 2019-05-20 NOTE — Evaluation (Signed)
Physical Therapy Evaluation Patient Details Name: David Hubbard MRN: [id number] DOB: Sep 17, 1940 Today's Date: 05/20/2019   History of Present Illness  Pt is a 79 y/o male with PMH of diverticulitis, nephrolithiasis, CKDIII, HTN, sleep apnea, B-cell lymphoma, depression, CAD s/p CABG, anxiety, chronic LBP who presented to ER with AMS. Found to be febrile. CT negative for acute findings,  but notable for moderate cerebral atrophy and chronic right maxillary sinusitis.CT abdomen/pelvis was also performed which reveals proximal sigmoid diverticulitis with adjacent 1.3 cm focus of contained free air. Admitted for sepsis due to acute sigmoid diverticulitis.   Clinical Impression  Pt was seen for mobility, noted his O2 sats were controlled and able to balance with RW.  He is incontinent of bowels and unaware, limited his distance walking today.  Follow up with pt to work on control of standing balance with safety and cued self awareness of his ability to control transfers and walking on RW.  Home therapy to follow up with him to get him walking with more independence and instruct pt and family for follow up with ex's and safety of mobility.    Follow Up Recommendations Home health PT;Supervision/Assistance - 24 hour    Equipment Recommendations  Rolling walker with 5" wheels    Recommendations for Other Services       Precautions / Restrictions Precautions Precautions: Fall Precaution Comments: incontinent of bowels Restrictions Weight Bearing Restrictions: No      Mobility  Bed Mobility Overal bed mobility: Needs Assistance Bed Mobility: Supine to Sit     Supine to sit: Supervision     General bed mobility comments: supervision with HOB elevated  Transfers Overall transfer level: Needs assistance Equipment used: Rolling walker (2 wheeled) Transfers: Sit to/from Stand Sit to Stand: Min guard;Min assist         General transfer comment: min guard at times, min assist from  recliner  Ambulation/Gait Ambulation/Gait assistance: Min guard Gait Distance (Feet): 10 Feet Assistive device: Rolling walker (2 wheeled);1 person hand held assist Gait Pattern/deviations: Step-through pattern;Wide base of support;Trunk flexed Gait velocity: household   General Gait Details: incontinent of bowels limiting distance  Stairs            Wheelchair Mobility    Modified Rankin (Stroke Patients Only)       Balance Overall balance assessment: Needs assistance Sitting-balance support: Feet supported Sitting balance-Leahy Scale: Good     Standing balance support: Bilateral upper extremity supported;During functional activity Standing balance-Leahy Scale: Poor Standing balance comment: requires help to maintain standing without AD                             Pertinent Vitals/Pain Pain Assessment: No/denies pain    Home Living Family/patient expects to be discharged to:: Private residence Living Arrangements: Non-relatives/Friends Available Help at Discharge: Available 24 hours/day;Friend(s) Type of Home: House Home Access: Level entry     Home Layout: Two level;Able to live on main level with bedroom/bathroom;1/2 bath on main level Home Equipment: Shower seat;Walker - 2 wheels;Cane - single point Additional Comments: pt cannot recall the number of rails on stairs in house    Prior Function Level of Independence: Independent         Comments: has sign other and daughter to help at home     Hand Dominance   Dominant Hand: Right    Extremity/Trunk Assessment   Upper Extremity Assessment Upper Extremity Assessment: Defer to OT evaluation  Lower Extremity Assessment Lower Extremity Assessment: Overall WFL for tasks assessed       Communication   Communication: HOH  Cognition Arousal/Alertness: Awake/alert Behavior During Therapy: WFL for tasks assessed/performed Overall Cognitive Status: History of cognitive impairments -  at baseline Area of Impairment: Problem solving;Awareness;Safety/judgement;Following commands;Memory;Attention;Orientation                 Orientation Level: Situation Current Attention Level: Selective Memory: Decreased recall of precautions;Decreased short-term memory Following Commands: Follows one step commands inconsistently;Follows one step commands with increased time Safety/Judgement: Decreased awareness of safety;Decreased awareness of deficits Awareness: Intellectual;Anticipatory Problem Solving: Slow processing;Difficulty sequencing;Requires verbal cues;Requires tactile cues        General Comments General comments (skin integrity, edema, etc.): Pt was unaware of his incontinence, was assisted to clean up in standing which he could not participate in doing from walker level    Exercises     Assessment/Plan    PT Assessment Patient needs continued PT services  PT Problem List Decreased range of motion;Decreased activity tolerance;Decreased balance;Decreased cognition;Decreased knowledge of use of DME;Decreased safety awareness;Obesity       PT Treatment Interventions DME instruction;Gait training;Stair training;Functional mobility training;Therapeutic activities;Therapeutic exercise;Balance training;Neuromuscular re-education;Patient/family education    PT Goals (Current goals can be found in the Care Plan section)  Acute Rehab PT Goals Patient Stated Goal: to get home  PT Goal Formulation: With patient Time For Goal Achievement: 05/27/19 Potential to Achieve Goals: Good    Frequency Min 3X/week   Barriers to discharge   has live in help per pt    Co-evaluation               AM-PAC PT "6 Clicks" Mobility  Outcome Measure Help needed turning from your back to your side while in a flat bed without using bedrails?: None Help needed moving from lying on your back to sitting on the side of a flat bed without using bedrails?: A Little Help needed moving  to and from a bed to a chair (including a wheelchair)?: A Little Help needed standing up from a chair using your arms (e.g., wheelchair or bedside chair)?: A Little Help needed to walk in hospital room?: A Little Help needed climbing 3-5 steps with a railing? : A Lot 6 Click Score: 18    End of Session Equipment Utilized During Treatment: Gait belt Activity Tolerance: Patient limited by fatigue;Treatment limited secondary to medical complications (Comment) Patient left: in chair;with call bell/phone within reach;with chair alarm set Nurse Communication: Mobility status PT Visit Diagnosis: Unsteadiness on feet (R26.81);Difficulty in walking, not elsewhere classified (R26.2)    Time: 1115-1140 PT Time Calculation (min) (ACUTE ONLY): 25 min   Charges:   PT Evaluation $PT Eval Moderate Complexity: 1 Mod PT Treatments $Gait Training: 8-22 mins       Ramond Dial 05/20/2019, 1:00 PM  Mee Hives, PT MS Acute Rehab Dept. Number: [place] and [place]

## 2019-05-20 NOTE — Progress Notes (Signed)
PROGRESS NOTE    David Hubbard  [id number] DOB: March 12, 1940 DOA: 05/17/2019 PCP: Audria Nine   Brief Narrative:  David Hubbard is a 79 yo male with PMH diverticulitis (last 02/2017), nephrolithiasis, CKDIII, HLD, HTN, sleep apnea, B-cell lymphoma, depression, CAD s/p CABG, anxiety, chronic LBP who presented to the ER with AMS per his significant other. He was also "leaning" unilaterally at times but this position changed and he did not have focal neuro deficits. He also had no photophobia nor nuchal rigidity.  In the ER he was found to be febrile (101.3, with higher fever reported at home PTA), HR 65, RR 25, SpO2 97% RA, BP 158/79 which further downtrended to 101/68 in the ER. On evaluation in the ER, his daughter is also bedside and she states his mentation is back to baseline and he does have underlying "slowed mentation" and is "forgetful" in nature. He underwent further imaging studies in the ER including CXR which was negative for focal infiltrates.  CT head negative for acute findings but notable for moderate cerebral atrophy and chronic right maxillary sinusitis. CT abdomen/pelvis was also performed which reveals proximal sigmoid diverticulitis with adjacent 1.3 cm focus of contained free air. General surgery was contacted from the ER with plans to evaluate patient however no urgent need for surgery. He did endorse lower abdominal pain on ROS. He was unaware of being febrile at home (possibly due to mentation). He denies nausea or vomiting and denies any diarrhea although his daughter reports that she thought his SO mentioned loose stools but he also has a history of occasional loose stools.    Assessment & Plan:   Principal Problem:   Acute diverticulitis Active Problems:   Sepsis ([place])   Perforated diverticulum   CKD (chronic kidney disease), stage III  Sepsis 2/2 acute sigmoid diverticulitis, POA Acute intractable abdominal pain, poor PO intake 2/2 above, POA Concurrent dehydration,  POA - CT abd/pelvis noted acute sigmoid diverticulitis with 1.3 cm contained free air - lactic 2.0 at admission in the setting of infection/dehydration - s/p rocephin/flagyl in ER - continue on Zosyn monotherapy for abdominal coverage - surgery consulted no indication for Sx currently - Abdominal pain markedly improving -advance diet per surgical recommendations -currently on sips of clears  Nephrolithiasis, non obstructive - 6 mm nonbobstructing left stone; UA negative - supportive management  Thrombocytopenia, chronic - Baseline 70-100 per chart review Lab Results  Component Value Date   PLT 110 (L) 05/20/2019  - bendamustine more likely to be culprit given history (see lymphoma)  Memory loss, informal diagnosis of dementia, unspecified - follows with neurology - started on Aricept 04/2019  Diffuse small lymphocytic lymphoma - Completed 4 cycles of bendamustine on 12/12/2018 - Follows outpatient with heme/onc  CAD s/p CABG - Continue asa - Hold BB in setting of hypotension  HTN, essential - Hypotensive in the ER, home meds on hold - Likely 2/2 poor PO intake/dehydration (Continues on IVF)  HLD  - On Repatha at home - Currently NPO/Sips  OSA - RT eval for continuing CPAP  CKD3-A, without AKI  -Baseline creatinine approximately 1.2 Lab Results  Component Value Date   CREATININE 0.94 05/20/2019   CREATININE 0.99 05/19/2019   CREATININE 1.08 05/18/2019   Depression/anxiety -Mood appropriate.  No home medications.  Database reviewed  DVT prophylaxis: Lovenox Code Status: Full Family Communication: Daughter  Status is: Inpatient  Dispo: The patient is from: Home  Anticipated d/c is to: Likely home with home health pending course              Anticipated d/c date is: likely 48-72 hours pending clinical course              Patient currently NOT medically stable for discharge given ongoing need for close monitoring, IVF, IV antibiotics, and  surgical evaluation.  Consultants:   General surgery  Procedures:   None planned  Antimicrobials:  Ceftriaxine/flagyl in ED x1 05/17/19 Zosyn 05/17/19 --> Ongoing  Subjective: No acute issues/events overnight. Patient poor historian in the setting of memory loss - denies headache, fevers, chills, nausea or vomiting.  Only complaint today is that he would like more food(currently on liquid diet).  Objective: Vitals:   05/19/19 1444 05/19/19 1947 05/19/19 1948 05/20/19 0603  BP: (!) 104/57 (!) 114/97  117/67  Pulse: 68 60 66 (!) 50  Resp:  17  18  Temp:  99.1 F (37.3 C)  (!) 97.3 F (36.3 C)  TempSrc:  Oral  Oral  SpO2:  94% 95% 95%  Weight:      Height:        Intake/Output Summary (Last 24 hours) at 05/20/2019 0748 Last data filed at 05/20/2019 0617 Gross per 24 hour  Intake 2747.1 ml  Output 1300 ml  Net 1447.1 ml   Filed Weights   05/17/19 1618 05/17/19 2319  Weight: 125 kg 124.4 kg    Examination:  General exam: Appears calm and comfortable  Respiratory system: Clear to auscultation. Respiratory effort normal. Cardiovascular system: S1 & S2 heard, RRR. No JVD, murmurs, rubs, gallops or clicks. No pedal edema. Gastrointestinal system: Abdomen is nondistended, mild tenderness at LLQ otherwise benign without guarding/rebound. Central nervous system: Alert and oriented. No focal neurological deficits. Extremities: Symmetric 5 x 5 power. Skin: No rashes, lesions or ulcers  Data Reviewed: I have personally reviewed following labs and imaging studies  CBC: Recent Labs  Lab 05/17/19 1617 05/18/19 0202 05/19/19 0150 05/20/19 0130  WBC 10.3 8.2 7.5 5.8  NEUTROABS 7.6 6.1  --   --   HGB 15.4 13.5 13.5 13.0  HCT 46.6 40.0 39.5 38.1*  MCV 94.9 94.8 95.0 93.4  PLT 123* 104* 98* [id number]*   Basic Metabolic Panel: Recent Labs  Lab 05/17/19 1617 05/18/19 0202 05/19/19 0150 05/20/19 0130  NA 139 137 135 138  K 4.4 3.5 3.5 3.5  CL 103 104 105 104  CO2 26 25 21*  22  GLUCOSE 89 114* 95 92  BUN 15 12 9 9   CREATININE 1.21 1.08 0.99 0.94  CALCIUM 9.8 8.8* 8.6* 8.8*  MG  --  1.6*  --   --    GFR: Estimated Creatinine Clearance: 93.7 mL/min (by C-G formula based on SCr of 0.94 mg/dL). Liver Function Tests: Recent Labs  Lab 05/17/19 1617 05/19/19 0150 05/20/19 0130  AST 18 14* 11*  ALT 16 9 11   ALKPHOS 80 55 52  BILITOT 0.7 1.1 1.2  PROT 6.4* 5.3* 5.4*  ALBUMIN 3.9 2.9* 2.8*   No results for input(s): LIPASE, AMYLASE in the last 168 hours. No results for input(s): AMMONIA in the last 168 hours. Coagulation Profile: Recent Labs  Lab 05/17/19 1617 05/18/19 0202  INR 1.1 1.2   Cardiac Enzymes: No results for input(s): CKTOTAL, CKMB, CKMBINDEX, TROPONINI in the last 168 hours. BNP (last 3 results) No results for input(s): PROBNP in the last 8760 hours. HbA1C: No results for input(s): HGBA1C in the  last 72 hours. CBG: No results for input(s): GLUCAP in the last 168 hours. Lipid Profile: No results for input(s): CHOL, HDL, LDLCALC, TRIG, CHOLHDL, LDLDIRECT in the last 72 hours. Thyroid Function Tests: Recent Labs    05/18/19 0202  TSH 2.351   Anemia Panel: No results for input(s): VITAMINB12, FOLATE, FERRITIN, TIBC, IRON, RETICCTPCT in the last 72 hours. Sepsis Labs: Recent Labs  Lab 05/17/19 1615 05/18/19 0202  PROCALCITON  --  <0.10  LATICACIDVEN 2.0*  --     Recent Results (from the past 240 hour(s))  Culture, blood (Routine x 2)     Status: None (Preliminary result)   Collection Time: 05/17/19  4:00 PM   Specimen: BLOOD RIGHT FOREARM  Result Value Ref Range Status   Specimen Description BLOOD RIGHT FOREARM  Final   Special Requests   Final    BOTTLES DRAWN AEROBIC AND ANAEROBIC Blood Culture adequate volume   Culture   Final    NO GROWTH 3 DAYS Performed at [facility], [place] [address], [place], [place] [address]    Report Status PENDING  Incomplete  Urine culture     Status: None   Collection Time:  05/17/19  4:00 PM   Specimen: Urine, Catheterized  Result Value Ref Range Status   Specimen Description URINE, CATHETERIZED  Final   Special Requests NONE  Final   Culture   Final    NO GROWTH Performed at [facility], [place] [address], [place], [place] [address]    Report Status 05/18/2019 FINAL  Final  Culture, blood (Routine x 2)     Status: None (Preliminary result)   Collection Time: 05/17/19  4:12 PM   Specimen: BLOOD LEFT FOREARM  Result Value Ref Range Status   Specimen Description BLOOD LEFT FOREARM  Final   Special Requests   Final    BOTTLES DRAWN AEROBIC AND ANAEROBIC Blood Culture results may not be optimal due to an inadequate volume of blood received in culture bottles   Culture   Final    NO GROWTH 3 DAYS Performed at [facility], [place] [address], [place], [place] [address]    Report Status PENDING  Incomplete  Respiratory Panel by RT PCR (Flu A&B, Covid) - Nasopharyngeal Swab     Status: None   Collection Time: 05/17/19  5:52 PM   Specimen: Nasopharyngeal Swab  Result Value Ref Range Status   SARS Coronavirus 2 by RT PCR NEGATIVE NEGATIVE Final    Comment: (NOTE) SARS-CoV-2 target nucleic acids are NOT DETECTED. The SARS-CoV-2 RNA is generally detectable in upper respiratoy specimens during the acute phase of infection. The lowest concentration of SARS-CoV-2 viral copies this assay can detect is 131 copies/mL. A negative result does not preclude SARS-Cov-2 infection and should not be used as the sole basis for treatment or other patient management decisions. A negative result may occur with  improper specimen collection/handling, submission of specimen other than nasopharyngeal swab, presence of viral mutation(s) within the areas targeted by this assay, and inadequate number of viral copies (<131 copies/mL). A negative result must be combined with clinical observations, patient history, and epidemiological information. The expected result is  Negative. Fact Sheet for Patients:  PinkCheek.be Fact Sheet for Healthcare Providers:  GravelBags.it This test is not yet ap proved or cleared by the [place] FDA and  has been authorized for detection and/or diagnosis of SARS-CoV-2 by FDA under an Emergency Use Authorization (EUA). This EUA will remain  in  effect (meaning this test can be used) for the duration of the COVID-19 declaration under Section 564(b)(1) of the Act, 21 U.S.C. section 360bbb-3(b)(1), unless the authorization is terminated or revoked sooner.    Influenza A by PCR NEGATIVE NEGATIVE Final   Influenza B by PCR NEGATIVE NEGATIVE Final    Comment: (NOTE) The Xpert Xpress SARS-CoV-2/FLU/RSV assay is intended as an aid in  the diagnosis of influenza from Nasopharyngeal swab specimens and  should not be used as a sole basis for treatment. Nasal washings and  aspirates are unacceptable for Xpert Xpress SARS-CoV-2/FLU/RSV  testing. Fact Sheet for Patients: PinkCheek.be Fact Sheet for Healthcare Providers: GravelBags.it This test is not yet approved or cleared by the [place] FDA and  has been authorized for detection and/or diagnosis of SARS-CoV-2 by  FDA under an Emergency Use Authorization (EUA). This EUA will remain  in effect (meaning this test can be used) for the duration of the  Covid-19 declaration under Section 564(b)(1) of the Act, 21  U.S.C. section 360bbb-3(b)(1), unless the authorization is  terminated or revoked. Performed at [facility], [place] [address], [place], [place] [address]          Radiology Studies: No results found.      Scheduled Meds: . donepezil  10 mg Oral QHS  . enoxaparin (LOVENOX) injection  40 mg Subcutaneous Q24H   Continuous Infusions: . sodium chloride 75 mL/hr at 05/20/19 0617  . piperacillin-tazobactam (ZOSYN)  IV Stopped (05/20/19  0557)     LOS: 3 days   Time spent: 34min  Naheim Burgen C Denica Web, DO [facility]  If 7PM-7AM, please contact night-coverage [email or web address]  05/20/2019, 7:48 AM

## 2019-05-20 NOTE — Progress Notes (Signed)
Subjective: CC: Doing well.  Reports minimal pain to the left lower quadrant.  No nausea or emesis.  Tolerating clear liquids.  BM yesterday.  About to work with OT.  Objective: Vital signs in last 24 hours: Temp:  [97.3 F (36.3 C)-99.1 F (37.3 C)] 97.3 F (36.3 C) (05/12 0603) Pulse Rate:  [50-68] 50 (05/12 0603) Resp:  [17-20] 18 (05/12 0603) BP: (104-120)/(57-97) 117/67 (05/12 0603) SpO2:  [94 %-95 %] 95 % (05/12 0603) Last BM Date: 05/19/19  Intake/Output from previous day: 05/11 0701 - 05/12 0700 In: 2747.1 [P.O.:840; I.V.:1794; IV Piggyback:113.1] Out: 1300 [Urine:1300] Intake/Output this shift: No intake/output data recorded.  PE: Gen: Alert, NAD, pleasant Pulm:Rate andeffort normal Abd: Soft, ND,minimal tenderness of the LLQ that is improved from yesterday. +BS Ext: No LE edema Skin: no rashes noted, warm and dry   Lab Results:  Recent Labs    05/19/19 0150 05/20/19 0130  WBC 7.5 5.8  HGB 13.5 13.0  HCT 39.5 38.1*  PLT 98* 110*   BMET Recent Labs    05/19/19 0150 05/20/19 0130  NA 135 138  K 3.5 3.5  CL 105 104  CO2 21* 22  GLUCOSE 95 92  BUN 9 9  CREATININE 0.99 0.94  CALCIUM 8.6* 8.8*   PT/INR Recent Labs    05/17/19 1617 05/18/19 0202  LABPROT 13.7 14.5  INR 1.1 1.2   CMP     Component Value Date/Time   NA 138 05/20/2019 0130   K 3.5 05/20/2019 0130   CL 104 05/20/2019 0130   CO2 22 05/20/2019 0130   GLUCOSE 92 05/20/2019 0130   BUN 9 05/20/2019 0130   CREATININE 0.94 05/20/2019 0130   CREATININE 1.14 02/04/2019 1421   CALCIUM 8.8 (L) 05/20/2019 0130   PROT 5.4 (L) 05/20/2019 0130   ALBUMIN 2.8 (L) 05/20/2019 0130   AST 11 (L) 05/20/2019 0130   AST 12 (L) 02/04/2019 1421   ALT 11 05/20/2019 0130   ALT 9 02/04/2019 1421   ALKPHOS 52 05/20/2019 0130   BILITOT 1.2 05/20/2019 0130   BILITOT 0.6 02/04/2019 1421   GFRNONAA >60 05/20/2019 0130   GFRNONAA >60 02/04/2019 1421   GFRAA >60 05/20/2019 0130   GFRAA  >60 02/04/2019 1421   Lipase  No results found for: LIPASE     Studies/Results: No results found.  Anti-infectives: Anti-infectives (From admission, onward)   Start     Dose/Rate Route Frequency Ordered Stop   05/18/19 0615  vancomycin (VANCOREADY) IVPB 1500 mg/300 mL  Status:  Discontinued     1,500 mg 150 mL/hr over 120 Minutes Intravenous Every 12 hours 05/17/19 1801 05/17/19 1806   05/18/19 0600  cefTRIAXone (ROCEPHIN) 2 g in sodium chloride 0.9 % 100 mL IVPB  Status:  Discontinued     2 g 200 mL/hr over 30 Minutes Intravenous Every 12 hours 05/17/19 1801 05/17/19 1806   05/17/19 2200  piperacillin-tazobactam (ZOSYN) IVPB 3.375 g     3.375 g 12.5 mL/hr over 240 Minutes Intravenous Every 8 hours 05/17/19 1932     05/17/19 2045  ceFEPIme (MAXIPIME) 2 g in sodium chloride 0.9 % 100 mL IVPB  Status:  Discontinued     2 g 200 mL/hr over 30 Minutes Intravenous  Once 05/17/19 2033 05/17/19 2034   05/17/19 2045  metroNIDAZOLE (FLAGYL) IVPB 500 mg  Status:  Discontinued     500 mg 100 mL/hr over 60 Minutes Intravenous Every 8 hours 05/17/19 2033 05/17/19  2034   05/17/19 2000  ampicillin (OMNIPEN) 2 g in sodium chloride 0.9 % 100 mL IVPB  Status:  Discontinued     2 g 300 mL/hr over 20 Minutes Intravenous Every 4 hours 05/17/19 1801 05/17/19 1806   05/17/19 1815  metroNIDAZOLE (FLAGYL) IVPB 500 mg     500 mg 100 mL/hr over 60 Minutes Intravenous  Once 05/17/19 1801 05/17/19 1942   05/17/19 1815  cefTRIAXone (ROCEPHIN) 1 g in sodium chloride 0.9 % 100 mL IVPB     1 g 200 mL/hr over 30 Minutes Intravenous  Once 05/17/19 1801 05/17/19 1909   05/17/19 1815  acyclovir (ZOVIRAX) 995 mg in dextrose 5 % 150 mL IVPB  Status:  Discontinued     10 mg/kg  99.3 kg (Adjusted) 169.9 mL/hr over 60 Minutes Intravenous Every 8 hours 05/17/19 1801 05/17/19 1806   05/17/19 1800  vancomycin (VANCOCIN) 2,500 mg in sodium chloride 0.9 % 500 mL IVPB  Status:  Discontinued     2,500 mg 250 mL/hr over  120 Minutes Intravenous  Once 05/17/19 1801 05/17/19 1806   05/17/19 1630  cefTRIAXone (ROCEPHIN) 1 g in sodium chloride 0.9 % 100 mL IVPB     1 g 200 mL/hr over 30 Minutes Intravenous  Once 05/17/19 1625 05/17/19 1722       Assessment/Plan HTN HLD CAD s/p CABG  Acute sigmoid diverticulitis with small focus of gas adjacent to this that is contained - Cont IV abx - Adv diet to FLD - No current plans for emergency surgery at this time. We will continue to follow along with you  FEN -FLD VTE -SCDs, Lovenox ID -Zosyn 5/9 >> WBC wnl at 5.8   LOS: 3 days    Jillyn Ledger , [facility] 05/20/2019, 8:57 AM Please see Amion for pager number during day hours 7:00am-4:30pm

## 2019-05-20 NOTE — TOC Initial Note (Signed)
Transition of Care [facility]) - Initial/Assessment Note    Patient Details  Name: David Hubbard MRN: [id number] Date of Birth: 06/10/1940  Transition of Care [facility]) CM/SW Contact:    Marilu Favre, RN Phone Number: 05/20/2019, 3:57 PM  Clinical Narrative:                  See previous TOC note. Called patient's daughter daughter regarding DME and home health.   PT recommends HHPT , 24 hr supervision and 3 in 1 and walker.   Between Vivien Rota and Shirlean Mylar patient has 24/7 supervision. Shirlean Mylar unsure if patient has walker or 3 in 1 already. As mentioned in previous note she is interested in a Rollator. NCM messaged PT and OT to see if they would recommend a Rollator  , waiting to hear back. Robin voiced understanding . Robin deferring home health and DME questions to [place].  Called Vivien Rota no answer will try again in morning  Expected Discharge Plan: [place] Barriers to Discharge: Continued Medical Work up   Patient Goals and CMS Choice   CMS Medicare.gov Compare Post Acute Care list provided to:: Patient Represenative (must comment)(pt daughter) Choice offered to / list presented to : Adult Children  Expected Discharge Plan and Services Expected Discharge Plan: [place] In-house Referral: Clinical Social Work Discharge Planning Services: CM Consult Post Acute Care Choice: Durable Medical Equipment, Home Health Living arrangements for the past 2 months: Single Family Home                           HH Arranged: PT, OT, Refused SNF          Prior Living Arrangements/Services Living arrangements for the past 2 months: Single Family Home Lives with:: Significant Other Patient language and need for interpreter reviewed:: Yes(no needs) Do you feel safe going back to the place where you live?: Yes      Need for Family Participation in Patient Care: Yes (Comment)(assistance/supervision with daily care) Care giver support system in place?: Yes  (comment)(pt daughter and pt significant other)   Criminal Activity/Legal Involvement Pertinent to Current Situation/Hospitalization: No - Comment as needed  Activities of Daily Living Home Assistive Devices/Equipment: Cane (specify quad or straight) ADL Screening (condition at time of admission) Patient's cognitive ability adequate to safely complete daily activities?: Yes Is the patient deaf or have difficulty hearing?: No Does the patient have difficulty seeing, even when wearing glasses/contacts?: No Does the patient have difficulty concentrating, remembering, or making decisions?: Yes Patient able to express need for assistance with ADLs?: Yes Does the patient have difficulty dressing or bathing?: No Independently performs ADLs?: Yes (appropriate for developmental age) Does the patient have difficulty walking or climbing stairs?: Yes Weakness of Legs: Both Weakness of Arms/Hands: None  Permission Sought/Granted Permission sought to share information with : Family Supports, PCP Permission granted to share information with : Yes, Verbal Permission Granted  Share Information with NAME: Berkley Harvey  Permission granted to share info w AGENCY: PCP  Permission granted to share info w Relationship: daughter  Permission granted to share info w Contact Information: [phone]  Emotional Assessment Appearance:: Appears stated age Attitude/Demeanor/Rapport: Other (comment)(telephonic assessment with daughter) Affect (typically observed): Other (comment)(telephonic assessment with pt daughter) Orientation: : Oriented to Self, Oriented to Place, Fluctuating Orientation (Suspected and/or reported Sundowners) Alcohol / Substance Use: Not Applicable Psych Involvement: (n/a)  Admission diagnosis:  Diverticulitis [K57.92] Acute diverticulitis [K57.92]  Patient Active Problem List   Diagnosis Date Noted  . Sepsis ([place]) 05/18/2019  . Perforated diverticulum 05/18/2019  . CKD (chronic kidney  disease), stage III 05/18/2019  . Acute diverticulitis 05/17/2019  . 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 Goals of care, counseling/discussion 08/25/2018  . Paronychia of great toe of right foot 07/07/2012  . Pain in joint, ankle and foot 07/07/2012  . Osteoarthritis of right knee 05/26/2012  . Preop cardiovascular exam 05/08/2012  . Myocardial infarction ([place])   . Hypertension   . Sleep apnea   . Skin cancer   . Coronary artery disease   . Hx of kidney disease    PCP:  Nicholes Rough, PA-C Pharmacy:   [facility] DR[facility] STORE [phone] [address], [address] AT [facility] OF [address] [place] [address] Phone: [phone] Fax: [phone]     Social Determinants of Health ([place]) Interventions    Readmission Risk Interventions Readmission Risk Prevention Plan 05/20/2019  Post Dischage Appt Complete  Medication Screening Complete  Transportation Screening Complete  Some recent data might be hidden

## 2019-05-20 NOTE — TOC Initial Note (Signed)
Transition of Care [facility]) - Initial/Assessment Note    Patient Details  Name: David Hubbard MRN: [id number] Date of Birth: 1940/10/23  [facility]) CM/SW Contact:    Alexander Mt, LCSW Phone Number: 05/20/2019, 11:21 AM  Clinical Narrative:                 CSW spoke with pt daughter Shirlean Mylar via telephone due to pt fluctuating orientation. Pt from home with significant other- he is a retired Curator and normally ambulates/takes care of self relatively independently. 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   Pt daughter confirms home address, PCP, and contact numbers for her and pt significant other. We discussed recommendations and pt daughter is interested in [facility] services. She states that Vivien Rota will provide support for pt and in between work trips she also will be available. Pt daughter knows pt would NOT want SNF and at this time they are planning on taking pt home. Pt daughter is interested in 3n1 and rollator. We discussed that we would wait on PT to see pt before placing order for equipment.   Pt daughter understands, is available as is pt significant other as needed. Pt daughter aware due to insurance coverage there may be limited HH options but we will make referral and discuss options with her.   Expected Discharge Plan: [place] Barriers to Discharge: Continued Medical Work up   Patient Goals and CMS Choice CMS Medicare.gov Compare Post Acute Care list provided to:: Patient Represenative (must comment)(pt daughter) Choice offered to / list presented to : Adult Children  Expected Discharge Plan and Services Expected Discharge Plan: [place] In-house Referral: Clinical Social Work Discharge Planning Services: CM Consult Post Acute Care Choice: Durable Medical Equipment, Home Health Living arrangements for the past 2 months: Single Family Home HH Arranged: PT, OT, Refused SNF  Prior Living  Arrangements/Services Living arrangements for the past 2 months: Single Family Home Lives with:: Significant Other Patient language and need for interpreter reviewed:: Yes(no needs) Do you feel safe going back to the place where you live?: Yes      Need for Family Participation in Patient Care: Yes (Comment)(assistance/supervision with daily care) Care giver support system in place?: Yes (comment)(pt daughter and pt significant other)   Criminal Activity/Legal Involvement Pertinent to Current Situation/Hospitalization: No - Comment as needed  Activities of Daily Living Home Assistive Devices/Equipment: Cane (specify quad or straight) ADL Screening (condition at time of admission) Patient's cognitive ability adequate to safely complete daily activities?: Yes Is the patient deaf or have difficulty hearing?: No Does the patient have difficulty seeing, even when wearing glasses/contacts?: No Does the patient have difficulty concentrating, remembering, or making decisions?: Yes Patient able to express need for assistance with ADLs?: Yes Does the patient have difficulty dressing or bathing?: No Independently performs ADLs?: Yes (appropriate for developmental age) Does the patient have difficulty walking or climbing stairs?: Yes Weakness of Legs: Both Weakness of Arms/Hands: None  Permission Sought/Granted Permission sought to share information with : Family Supports, PCP Permission granted to share information with : Yes, Verbal Permission Granted  Share Information with NAME: Berkley Harvey  Permission granted to share info w AGENCY: PCP  Permission granted to share info w Relationship: daughter  Permission granted to share info w Contact Information: [phone]  Emotional Assessment Appearance:: Appears stated age Attitude/Demeanor/Rapport: Other (comment)(telephonic assessment with daughter) Affect (typically observed): Other (comment)(telephonic assessment with pt  daughter)  Orientation: : Oriented to Self, Oriented to Place, Fluctuating Orientation (Suspected and/or reported Sundowners) Alcohol / Substance Use: Not Applicable Psych Involvement: (n/a)  Admission diagnosis:  Diverticulitis [K57.92] Acute diverticulitis [K57.92] Patient Active Problem List   Diagnosis Date Noted  . Sepsis ([place]) 05/18/2019  . Perforated diverticulum 05/18/2019  . CKD (chronic kidney disease), stage III 05/18/2019  . Acute diverticulitis 05/17/2019  . 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 Goals of care, counseling/discussion 08/25/2018  . Paronychia of great toe of right foot 07/07/2012  . Pain in joint, ankle and foot 07/07/2012  . Osteoarthritis of right knee 05/26/2012  . Preop cardiovascular exam 05/08/2012  . Myocardial infarction ([place])   . Hypertension   . Sleep apnea   . Skin cancer   . Coronary artery disease   . Hx of kidney disease    PCP:  Nicholes Rough, PA-C Pharmacy:   [facility] DR[facility] STORE [phone] [address], [address] AT [facility] OF [address] [place] [address] Phone: [phone] Fax: [phone]  Readmission Risk Interventions Readmission Risk Prevention Plan 05/20/2019  Post Dischage Appt Complete  Medication Screening Complete  Transportation Screening Complete  Some recent data might be hidden

## 2019-05-20 NOTE — Evaluation (Signed)
Occupational Therapy Evaluation Patient Details Name: David Hubbard MRN: [id number] DOB: [birth year] Today's Date: 05/20/2019    History of Present Illness Pt is a 79 y/o male with PMH of diverticulitis, nephrolithiasis, CKDIII, HTN, sleep apnea, B-cell lymphoma, depression, CAD s/p CABG, anxiety, chronic LBP who presented to ER with AMS. Found to be febrile. CT negative for acute findings,  but notable for moderate cerebral atrophy and chronic right maxillary sinusitis.CT abdomen/pelvis was also performed which reveals proximal sigmoid diverticulitis with adjacent 1.3 cm focus of contained free air. Admitted for sepsis due to acute sigmoid diverticulitis.    Clinical Impression   PTA patient independent with ADLs, mobility PTA; reports living with his girlfriend and his daughter assists with medication mgmt.  He was admitted for above and limited by problem list below, including impaired cognition, decreased activity tolerance and impaired balance.  Pt requires min assist for bed mobility, min assist to min guard for transfers and limited in room mobility using RW, mod assist for LB ADLs and total assist for toileting (incontinent BM in bed and condom cath was off upon entry). He is oriented x 3 (reports being at the hospital because "someone brought him here"), follows commands with increased time and requires cueing for awareness, safety, and problem solving. Patient will benefit from continued OT services while admitted and after dc at [facility] level to optimize independence and return to [facility], if he has 24/7 support; otherwise may need to consider SNF.     Follow Up Recommendations  Home health OT;Supervision/Assistance - 24 hour    Equipment Recommendations  3 in 1 bedside commode    Recommendations for Other Services       Precautions / Restrictions Precautions Precautions: Fall Restrictions Weight Bearing Restrictions: No      Mobility Bed Mobility Overal bed mobility: Needs  Assistance Bed Mobility: Supine to Sit     Supine to sit: Min assist     General bed mobility comments: to elevate trunk, cueing for technique, hand placement with increased time   Transfers Overall transfer level: Needs assistance Equipment used: Rolling walker (2 wheeled) Transfers: Sit to/from Stand Sit to Stand: Min assist;Min guard         General transfer comment: min assist from EOB, min guard from recliner with cueing for hand placement and safety     Balance Overall balance assessment: Needs assistance Sitting-balance support: No upper extremity supported;Feet supported Sitting balance-Leahy Scale: Fair     Standing balance support: Bilateral upper extremity supported;During functional activity Standing balance-Leahy Scale: Poor Standing balance comment: relies on BUE support                           ADL either performed or assessed with clinical judgement   ADL Overall ADL's : Needs assistance/impaired     Grooming: Set up;Sitting   Upper Body Bathing: Set up;Sitting   Lower Body Bathing: Moderate assistance;Sit to/from stand   Upper Body Dressing : Minimal assistance;Sitting   Lower Body Dressing: Moderate assistance;Sit to/from stand Lower Body Dressing Details (indicate cue type and reason): requires assist  for socks, min assist sit to stand from EOB  Toilet Transfer: Min guard;Ambulation;RW Toilet Transfer Details (indicate cue type and reason): simulated to recliner  Toileting- Clothing Manipulation and Hygiene: Sit to/from stand;Total assistance Toileting - Clothing Manipulation Details (indicate cue type and reason): incontinent of BM in bed, total assist for hygiene in standing      Functional mobility  during ADLs: Minimal assistance;Min guard;Rolling walker General ADL Comments: pt limited by decreased activity tolerance, balance, and cognition      Vision Baseline Vision/History: Wears glasses Wears Glasses: Reading  only Patient Visual Report: No change from baseline Vision Assessment?: No apparent visual deficits     Perception     Praxis      Pertinent Vitals/Pain Pain Assessment: Faces Faces Pain Scale: Hurts little more Pain Location: stomach with forward lean  Pain Descriptors / Indicators: Discomfort Pain Intervention(s): Limited activity within patient's tolerance;Monitored during session;Repositioned     Hand Dominance Right   Extremity/Trunk Assessment Upper Extremity Assessment Upper Extremity Assessment: Overall WFL for tasks assessed   Lower Extremity Assessment Lower Extremity Assessment: Defer to PT evaluation       Communication Communication Communication: HOH   Cognition Arousal/Alertness: Awake/alert Behavior During Therapy: WFL for tasks assessed/performed Overall Cognitive Status: History of cognitive impairments - at baseline Area of Impairment: Orientation;Following commands;Awareness;Problem solving;Safety/judgement;Memory                 Orientation Level: Disoriented to;Situation   Memory: Decreased short-term memory Following Commands: Follows one step commands consistently;Follows one step commands with increased time;Follows multi-step commands inconsistently Safety/Judgement: Decreased awareness of safety;Decreased awareness of deficits Awareness: Emergent Problem Solving: Slow processing;Difficulty sequencing;Requires verbal cues General Comments: per MD note, daughter reports memory impairment at baseline    General Comments       Exercises     Shoulder [place] expects to be discharged to:: Private residence Living Arrangements: Non-relatives/Friends Available Help at Discharge: Available 24 hours/day;Friend(s)(daughter helps PRN, live in girlfriend ) Type of Home: House Home Access: Level entry     Home Layout: Two level;Able to live on main level with bedroom/bathroom;1/2 bath on main level      Bathroom Shower/Tub: Walk-in shower         Home Equipment: Clinical cytogeneticist - 2 wheels;Cane - single point   Additional Comments: limited historain       Prior Functioning/Environment Level of Independence: Independent        Comments: independent ADLs, IADLs, reports driving "a little", daughter helps with meds         OT Problem List: Decreased activity tolerance;Impaired balance (sitting and/or standing);Decreased safety awareness;Decreased cognition;Decreased knowledge of use of DME or AE;Decreased knowledge of precautions;Obesity;Pain      OT Treatment/Interventions: Self-care/ADL training;Energy conservation;DME and/or AE instruction;Therapeutic activities;Cognitive remediation/compensation;Balance training;Patient/family education    OT Goals(Current goals can be found in the care plan section) Acute Rehab OT Goals Patient Stated Goal: to get home  OT Goal Formulation: With patient Time For Goal Achievement: 06/03/19 Potential to Achieve Goals: Good  OT Frequency: Min 2X/week   Barriers to D/C:            Co-evaluation              AM-PAC OT "6 Clicks" Daily Activity     Outcome Measure Help from another person eating meals?: A Little Help from another person taking care of personal grooming?: A Little Help from another person toileting, which includes using toliet, bedpan, or urinal?: Total Help from another person bathing (including washing, rinsing, drying)?: A Lot Help from another person to put on and taking off regular upper body clothing?: A Little Help from another person to put on and taking off regular lower body clothing?: A Lot 6 Click Score: 14   End of Session Equipment Utilized During Treatment: Rolling walker  Nurse Communication: Mobility status  Activity Tolerance: Patient tolerated treatment well Patient left: in chair;with call bell/phone within reach;with chair alarm set;with nursing/sitter in room  OT Visit Diagnosis:  Other abnormalities of gait and mobility (R26.89);Muscle weakness (generalized) (M62.81);Pain Pain - part of body: (abd)                Time: [id number] OT Time Calculation (min): 23 min Charges:  OT General Charges $OT Visit: 1 Visit OT Evaluation $OT Eval Moderate Complexity: 1 Mod OT Treatments $Self Care/Home Management : 8-22 mins  Jolaine Artist, OT Acute Rehabilitation Services Pager [phone] Office [phone]   Delight Stare 05/20/2019, 10:04 AM

## 2019-05-21 LAB — COMPREHENSIVE METABOLIC PANEL
ALT: 9 U/L (ref 0–44)
AST: 13 U/L — ABNORMAL LOW (ref 15–41)
Albumin: 2.8 g/dL — ABNORMAL LOW (ref 3.5–5.0)
Alkaline Phosphatase: 49 U/L (ref 38–126)
Anion gap: 10 (ref 5–15)
BUN: 6 mg/dL — ABNORMAL LOW (ref 8–23)
CO2: 22 mmol/L (ref 22–32)
Calcium: 8.8 mg/dL — ABNORMAL LOW (ref 8.9–10.3)
Chloride: 106 mmol/L (ref 98–111)
Creatinine, Ser: 0.98 mg/dL (ref 0.61–1.24)
GFR calc Af Amer: >60 mL/min (ref >60)
GFR calc non Af Amer: >60 mL/min (ref >60)
Glucose, Bld: 122 mg/dL — ABNORMAL HIGH (ref 70–99)
Potassium: 3.5 mmol/L (ref 3.5–5.1)
Sodium: 138 mmol/L (ref 135–145)
Total Bilirubin: 0.5 mg/dL (ref 0.3–1.2)
Total Protein: 5.2 g/dL — ABNORMAL LOW (ref 6.5–8.1)

## 2019-05-21 LAB — CBC
HCT: 39 % (ref 39.0–52.0)
Hemoglobin: 13.4 g/dL (ref 13.0–17.0)
MCH: 32.1 pg (ref 26.0–34.0)
MCHC: 34.4 g/dL (ref 30.0–36.0)
MCV: 93.3 fL (ref 80.0–100.0)
Platelets: 121 10*3/uL — ABNORMAL LOW (ref 150–400)
RBC: 4.18 MIL/uL — ABNORMAL LOW (ref 4.22–5.81)
RDW: 13.8 % (ref 11.5–15.5)
WBC: 4.3 10*3/uL (ref 4.0–10.5)
nRBC: 0 % (ref 0.0–0.2)

## 2019-05-21 MED ORDER — AMOXICILLIN-POT CLAVULANATE 875-125 MG PO TABS: 1.0000 | ORAL_TABLET | Freq: Two times a day (BID) | ORAL | 0 refills | Status: AC

## 2019-05-21 NOTE — Care Management Important Message (Signed)
Important Message  Patient Details  Name: David Hubbard MRN: [id number] Date of Birth: October 28, 1940   Medicare Important Message Given:  Yes     Lavanna Rog Montine Circle 05/21/2019, 3:40 PM

## 2019-05-21 NOTE — Discharge Summary (Signed)
Physician Discharge Summary  THERAN COOLING [id number] DOB: August 03, 1940 DOA: 05/17/2019  PCP: Nicholes Rough, PA-C  Admit date: 05/17/2019 Discharge date: 05/21/2019  Admitted From: Home Disposition:  Home health  Recommendations for Outpatient Follow-up:  1. Follow up with PCP in 1-2 weeks 2. Please obtain BMP/CBC in one week  Home Health: Yes Equipment/Devices: 3 and 1, walker  Discharge Condition: Stable CODE STATUS: Full Diet recommendation: Soft diet, advance to regular diet as tolerated over the next 2 to 3 days  Brief/Interim Summary: [name] is a 79 yo male with PMH diverticulitis (last 02/2017), nephrolithiasis, CKDIII, HLD, HTN, sleep apnea, B-cell lymphoma, depression, CAD s/p CABG, anxiety, chronic LBP who presented to the ER with AMS per his significant other. He was also "leaning" unilaterally at times but this position changed and he did not have focal neuro deficits. He also had no photophobia nor nuchal rigidity.  In the ER he was found to be febrile (101.3, with higher fever reported at home PTA), HR 65, RR 25, SpO2 97% RA, BP 158/79 which further downtrended to 101/68 in the ER. On evaluation in the ER, his daughter is also bedside and she states his mentation is back to baseline and he does have underlying "slowed mentation" and is "forgetful" in nature. He underwent further imaging studies in the ER including CXR which was negative for focal infiltrates. CT head negative for acute findings but notable for moderate cerebral atrophy and chronic right maxillary sinusitis. CT abdomen/pelvis was also performed which reveals proximal sigmoid diverticulitis with adjacent 1.3 cm focus of contained free air. General surgery was contacted from the ER with plans to evaluate patient however no urgent need for surgery. He did endorse lower abdominal pain on ROS. He was unaware of being febrile at home (possibly due to mentation). He denies nausea or vomiting and denies any diarrhea although  his daughter reports that she thought his SO mentioned loose stools but he also has a history of occasional loose stools.  Patient admitted as above with what appears to be consistent with acute diverticulitis with concern for ruptured diverticulum given free air noted in the abdomen.  Unfortunately patient has somewhat advanced stage unspecified dementia and history is somewhat limited and physical exam is unreliable.  Due to patient's findings on imaging and symptoms patient was admitted for IV antibiotics, patient's symptoms have resolved, no fever or leukocytosis for 48 hours now, surgery was consulted at admission given concern for perforated bowel however given localized area with diverticulitis now resolving patient is nonsurgical candidate otherwise stable and agreeable for discharge home.  Patient's blood pressure was moderately well-controlled during hospital stay off of his home antihypertensive medications, somewhat hypotensive at intake, these have been held at discharge, certainly if patient's blood pressure or heart rate increase over the next 1 to 2 weeks it would be reasonable to resume these medications, would confirm with PCP prior to any further medication changes however. Lengthy discussion with daughter about need for antibiotic completion as well as monitoring of diet the next 24 to 48 hours, lengthy discussion that should patient have worsening pain nausea vomiting diarrhea constipation or fevers they should bring him back to the ED or call their PCP.Marland Kitchen   Discharge Diagnoses:  Principal Problem:   Acute diverticulitis Active Problems:   Sepsis ([place])   Perforated diverticulum   CKD (chronic kidney disease), stage III    Discharge Instructions  Discharge Instructions    Call MD for:  difficulty breathing, headache or visual  disturbances   Complete by: As directed    Call MD for:  extreme fatigue   Complete by: As directed    Call MD for:  hives   Complete by: As directed     Call MD for:  persistant dizziness or light-headedness   Complete by: As directed    Call MD for:  persistant nausea and vomiting   Complete by: As directed    Call MD for:  redness, tenderness, or signs of infection (pain, swelling, redness, odor or green/yellow discharge around incision site)   Complete by: As directed    Call MD for:  severe uncontrolled pain   Complete by: As directed    Call MD for:  temperature >100.4   Complete by: As directed    Diet - low sodium heart healthy   Complete by: As directed    Increase activity slowly   Complete by: As directed      Allergies as of 05/21/2019      Reactions   Iodinated Diagnostic Agents Nausea And Vomiting   Pt now reports he experiences nausea and vomiting with IV contrast agents after contrast was given tonight    Statins Other (See Comments)   myalgias      Medication List    STOP taking these medications   loperamide 2 MG capsule Commonly known as: IMODIUM   metoprolol tartrate 25 MG tablet Commonly known as: LOPRESSOR     TAKE these medications   amoxicillin-clavulanate 875-125 MG tablet Commonly known as: Augmentin Take 1 tablet by mouth 2 (two) times daily for 10 days.   aspirin EC 81 MG tablet Take 81 mg by mouth daily.   clotrimazole 1 % cream Commonly known as: LOTRIMIN Apply 1 application topically daily. Apply to places on scalp   donepezil 10 MG tablet Commonly known as: ARICEPT Take 10 mg by mouth at bedtime.   famciclovir 250 MG tablet Commonly known as: FAMVIR Take 1 tablet (250 mg total) by mouth daily.   Repatha SureClick [id number] MG/ML Soaj Generic drug: Evolocumab Inject 140 mg into the skin every 14 (fourteen) days. Every other Sunday   triamcinolone cream 0.1 % Commonly known as: KENALOG Apply 1 application topically 2 (two) times daily as needed (scalp sores).       Allergies  Allergen Reactions  . Iodinated Diagnostic Agents Nausea And Vomiting    Pt now reports he  experiences nausea and vomiting with IV contrast agents after contrast was given tonight    . Statins Other (See Comments)    myalgias    Consultations:  General surgery   Procedures/Studies: CT ABDOMEN PELVIS WO CONTRAST  Result Date: 05/17/2019 CLINICAL DATA:  Fever. EXAM: CT ABDOMEN AND PELVIS WITHOUT CONTRAST TECHNIQUE: Multidetector CT imaging of the abdomen and pelvis was performed following the standard protocol without IV contrast. COMPARISON:  August 07, 2018 FINDINGS: Lower chest: Multiple sternal wires are seen. Mild atelectasis is seen within the bilateral lung bases. Hepatobiliary: No focal liver abnormality is seen. No gallstones, gallbladder wall thickening, or biliary dilatation. Pancreas: Unremarkable. No pancreatic ductal dilatation or surrounding inflammatory changes. Spleen: Normal in size without focal abnormality. Adrenals/Urinary Tract: Adrenal glands are unremarkable. Kidneys are normal in size, without focal lesions or hydronephrosis. A 6 mm nonobstructing renal stone is seen within the posterior aspect of the mid left kidney. Bladder is unremarkable. Stomach/Bowel: Stomach is within normal limits. Appendix appears normal. No evidence of bowel dilatation. Markedly inflamed diverticula are seen within the proximal sigmoid  colon. An adjacent 1.3 cm focus of contained free air is seen (axial CT image 85, CT series number 3). Vascular/Lymphatic: Marked severity aortic calcification. No enlarged abdominal or pelvic lymph nodes. Reproductive: The prostate gland is mildly enlarged. Other: No abdominal wall hernia or abnormality. No abdominopelvic ascites. Musculoskeletal: Multilevel degenerative changes seen throughout the lumbar spine. IMPRESSION: 1. Markedly severity sigmoid diverticulitis with an adjacent 1.3 cm focus of contained free air. 2. 6 mm nonobstructing renal stone within the left kidney. Aortic Atherosclerosis (ICD10-I70.0). Electronically Signed   By: Virgina Norfolk  M.D.   On: 05/17/2019 18:41   DG Chest 2 View  Result Date: 05/17/2019 CLINICAL DATA:  Altered mental status. EXAM: CHEST - 2 VIEW COMPARISON:  February 28, 2017 FINDINGS: Multiple sternal wires and vascular clips are present. Decreased lung volumes are seen which is likely, in part, secondary to suboptimal patient inspiration. Mild, diffuse chronic appearing increased lung markings are noted. There is no evidence of acute infiltrate, pleural effusion or pneumothorax. The cardiac silhouette is moderately enlarged and unchanged in size. A radiopaque left shoulder replacement is seen. Multilevel degenerative changes seen throughout the thoracic spine. IMPRESSION: 1. Evidence of prior median sternotomy/CABG. 2. Chronic-appearing increased lung markings without evidence of acute or active cardiopulmonary disease. Electronically Signed   By: Virgina Norfolk M.D.   On: 05/17/2019 16:46   CT Head Wo Contrast  Result Date: 05/17/2019 CLINICAL DATA:  Altered mental status. EXAM: CT HEAD WITHOUT CONTRAST TECHNIQUE: Contiguous axial images were obtained from the base of the skull through the vertex without intravenous contrast. COMPARISON:  None. FINDINGS: Brain: No evidence of acute infarction, hemorrhage, hydrocephalus, extra-axial collection or mass lesion/mass effect. Vascular: Calcific atherosclerotic disease of the intra cavernous carotid arteries. Skull: Normal. Negative for fracture or focal lesion. Sinuses/Orbits: Chronic right maxillary sinusitis. Other: None. IMPRESSION: 1. No acute intracranial abnormality. Moderate brain parenchymal volume loss. 2. Chronic right maxillary sinusitis. Electronically Signed   By: Fidela Salisbury M.D.   On: 05/17/2019 17:22     Subjective: No acute issues or events overnight, patient denies nausea, vomiting, diarrhea, constipation, headache, fevers, chills.   Discharge Exam: Vitals:   05/20/19 2109 05/21/19 0450  BP: (!) 147/79 133/72  Pulse: (!) 53 (!) 54   Resp: 17 16  Temp: 99.3 F (37.4 C) 98.1 F (36.7 C)  SpO2: 97% 98%   Vitals:   05/20/19 0603 05/20/19 1554 05/20/19 2109 05/21/19 0450  BP: 117/67 129/72 (!) 147/79 133/72  Pulse: (!) 50 (!) 51 (!) 53 (!) 54  Resp: 18 14 17 16   Temp: (!) 97.3 F (36.3 C) 97.8 F (36.6 C) 99.3 F (37.4 C) 98.1 F (36.7 C)  TempSrc: Oral Oral Oral Oral  SpO2: 95% 92% 97% 98%  Weight:      Height:       General: Pt is alert, awake, not in acute distress Cardiovascular: RRR, S1/S2 +, no rubs, no gallops Respiratory: CTA bilaterally, no wheezing, no rhonchi Abdominal: Soft, NT, ND, bowel sounds + Extremities: no edema, no cyanosis   The results of significant diagnostics from this hospitalization (including imaging, microbiology, ancillary and laboratory) are listed below for reference.     Microbiology: Recent Results (from the past 240 hour(s))  Culture, blood (Routine x 2)     Status: None (Preliminary result)   Collection Time: 05/17/19  4:00 PM   Specimen: BLOOD RIGHT FOREARM  Result Value Ref Range Status   Specimen Description BLOOD RIGHT FOREARM  Final   Special  Requests   Final    BOTTLES DRAWN AEROBIC AND ANAEROBIC Blood Culture adequate volume   Culture   Final    NO GROWTH 4 DAYS Performed at [facility], [place] [address] [place], [place] [address]    Report Status PENDING  Incomplete  Urine culture     Status: None   Collection Time: 05/17/19  4:00 PM   Specimen: Urine, Catheterized  Result Value Ref Range Status   Specimen Description URINE, CATHETERIZED  Final   Special Requests NONE  Final   Culture   Final    NO GROWTH Performed at [facility], [place] [address] [place], [place] [address]    Report Status 05/18/2019 FINAL  Final  Culture, blood (Routine x 2)     Status: None (Preliminary result)   Collection Time: 05/17/19  4:12 PM   Specimen: BLOOD LEFT FOREARM  Result Value Ref Range Status   Specimen Description BLOOD LEFT FOREARM  Final    Special Requests   Final    BOTTLES DRAWN AEROBIC AND ANAEROBIC Blood Culture results may not be optimal due to an inadequate volume of blood received in culture bottles   Culture   Final    NO GROWTH 4 DAYS Performed at [facility], [place] [address], [place], [place] [address]    Report Status PENDING  Incomplete  Respiratory Panel by RT PCR (Flu A&B, Covid) - Nasopharyngeal Swab     Status: None   Collection Time: 05/17/19  5:52 PM   Specimen: Nasopharyngeal Swab  Result Value Ref Range Status   SARS Coronavirus 2 by RT PCR NEGATIVE NEGATIVE Final    Comment: (NOTE) SARS-CoV-2 target nucleic acids are NOT DETECTED. The SARS-CoV-2 RNA is generally detectable in upper respiratoy specimens during the acute phase of infection. The lowest concentration of SARS-CoV-2 viral copies this assay can detect is 131 copies/mL. A negative result does not preclude SARS-Cov-2 infection and should not be used as the sole basis for treatment or other patient management decisions. A negative result may occur with  improper specimen collection/handling, submission of specimen other than nasopharyngeal swab, presence of viral mutation(s) within the areas targeted by this assay, and inadequate number of viral copies (<131 copies/mL). A negative result must be combined with clinical observations, patient history, and epidemiological information. The expected result is Negative. Fact Sheet for Patients:  PinkCheek.be Fact Sheet for Healthcare Providers:  GravelBags.it This test is not yet ap proved or cleared by the [place] FDA and  has been authorized for detection and/or diagnosis of SARS-CoV-2 by FDA under an Emergency Use Authorization (EUA). This EUA will remain  in effect (meaning this test can be used) for the duration of the COVID-19 declaration under Section 564(b)(1) of the Act, 21 U.S.C. section 360bbb-3(b)(1), unless the  authorization is terminated or revoked sooner.    Influenza A by PCR NEGATIVE NEGATIVE Final   Influenza B by PCR NEGATIVE NEGATIVE Final    Comment: (NOTE) The Xpert Xpress SARS-CoV-2/FLU/RSV assay is intended as an aid in  the diagnosis of influenza from Nasopharyngeal swab specimens and  should not be used as a sole basis for treatment. Nasal washings and  aspirates are unacceptable for Xpert Xpress SARS-CoV-2/FLU/RSV  testing. Fact Sheet for Patients: PinkCheek.be Fact Sheet for Healthcare Providers: GravelBags.it This test is not yet approved or cleared by the [place] FDA and  has been authorized for detection and/or diagnosis of SARS-CoV-2 by  FDA under an  Emergency Use Authorization (EUA). This EUA will remain  in effect (meaning this test can be used) for the duration of the  Covid-19 declaration under Section 564(b)(1) of the Act, 21  U.S.C. section 360bbb-3(b)(1), unless the authorization is  terminated or revoked. Performed at [facility], [place] [address] [place], [place] [address]      Labs: BNP (last 3 results) No results for input(s): BNP in the last 8760 hours. Basic Metabolic Panel: Recent Labs  Lab 05/17/19 1617 05/18/19 0202 05/19/19 0150 05/20/19 0130 05/21/19 0906  NA 139 137 135 138 138  K 4.4 3.5 3.5 3.5 3.5  CL 103 104 105 104 106  CO2 26 25 21* 22 22  GLUCOSE 89 114* 95 92 122*  BUN 15 12 9 9  6*  CREATININE 1.21 1.08 0.99 0.94 0.98  CALCIUM 9.8 8.8* 8.6* 8.8* 8.8*  MG  --  1.6*  --   --   --    Liver Function Tests: Recent Labs  Lab 05/17/19 1617 05/19/19 0150 05/20/19 0130 05/21/19 0906  AST 18 14* 11* 13*  ALT 16 9 11 9   ALKPHOS 80 55 52 49  BILITOT 0.7 1.1 1.2 0.5  PROT 6.4* 5.3* 5.4* 5.2*  ALBUMIN 3.9 2.9* 2.8* 2.8*   No results for input(s): LIPASE, AMYLASE in the last 168 hours. No results for input(s): AMMONIA in the last 168 hours. CBC: Recent Labs   Lab 05/17/19 1617 05/18/19 0202 05/19/19 0150 05/20/19 0130 05/21/19 0906  WBC 10.3 8.2 7.5 5.8 4.3  NEUTROABS 7.6 6.1  --   --   --   HGB 15.4 13.5 13.5 13.0 13.4  HCT 46.6 40.0 39.5 38.1* 39.0  MCV 94.9 94.8 95.0 93.4 93.3  PLT 123* 104* 98* 110* 121*   Cardiac Enzymes: No results for input(s): CKTOTAL, CKMB, CKMBINDEX, TROPONINI in the last 168 hours. BNP: Invalid input(s): POCBNP CBG: No results for input(s): GLUCAP in the last 168 hours. D-Dimer No results for input(s): DDIMER in the last 72 hours. Hgb A1c No results for input(s): HGBA1C in the last 72 hours. Lipid Profile No results for input(s): CHOL, HDL, LDLCALC, TRIG, CHOLHDL, LDLDIRECT in the last 72 hours. Thyroid function studies No results for input(s): TSH, T4TOTAL, T3FREE, THYROIDAB in the last 72 hours.  Invalid input(s): FREET3 Anemia work up No results for input(s): VITAMINB12, FOLATE, FERRITIN, TIBC, IRON, RETICCTPCT in the last 72 hours. Urinalysis    Component Value Date/Time   COLORURINE YELLOW 05/17/2019 1616   APPEARANCEUR CLEAR 05/17/2019 1616   LABSPEC 1.014 05/17/2019 1616   PHURINE 5.0 05/17/2019 1616   GLUCOSEU NEGATIVE 05/17/2019 1616   HGBUR NEGATIVE 05/17/2019 [place] 05/17/2019 1616   KETONESUR NEGATIVE 05/17/2019 1616   PROTEINUR NEGATIVE 05/17/2019 1616   UROBILINOGEN 0.2 03/20/2014 2106   NITRITE NEGATIVE 05/17/2019 1616   LEUKOCYTESUR NEGATIVE 05/17/2019 1616   Sepsis Labs Invalid input(s): PROCALCITONIN,  WBC,  LACTICIDVEN Microbiology Recent Results (from the past 240 hour(s))  Culture, blood (Routine x 2)     Status: None (Preliminary result)   Collection Time: 05/17/19  4:00 PM   Specimen: BLOOD RIGHT FOREARM  Result Value Ref Range Status   Specimen Description BLOOD RIGHT FOREARM  Final   Special Requests   Final    BOTTLES DRAWN AEROBIC AND ANAEROBIC Blood Culture adequate volume   Culture   Final    NO GROWTH 4 DAYS Performed at [facility], [place],  [place] [address]    Report Status PENDING  Incomplete  Urine culture     Status: None   Collection Time: 05/17/19  4:00 PM   Specimen: Urine, Catheterized  Result Value Ref Range Status   Specimen Description URINE, CATHETERIZED  Final   Special Requests NONE  Final   Culture   Final    NO GROWTH Performed at [facility], [address], [place], [place] [address]    Report Status 05/18/2019 FINAL  Final  Culture, blood (Routine x 2)     Status: None (Preliminary result)   Collection Time: 05/17/19  4:12 PM   Specimen: BLOOD LEFT FOREARM  Result Value Ref Range Status   Specimen Description BLOOD LEFT FOREARM  Final   Special Requests   Final    BOTTLES DRAWN AEROBIC AND ANAEROBIC Blood Culture results may not be optimal due to an inadequate volume of blood received in culture bottles   Culture   Final    NO GROWTH 4 DAYS Performed at [facility], [place] [address], [place], [place] [address]    Report Status PENDING  Incomplete  Respiratory Panel by RT PCR (Flu A&B, Covid) - Nasopharyngeal Swab     Status: None   Collection Time: 05/17/19  5:52 PM   Specimen: Nasopharyngeal Swab  Result Value Ref Range Status   SARS Coronavirus 2 by RT PCR NEGATIVE NEGATIVE Final    Comment: (NOTE) SARS-CoV-2 target nucleic acids are NOT DETECTED. The SARS-CoV-2 RNA is generally detectable in upper respiratoy specimens during the acute phase of infection. The lowest concentration of SARS-CoV-2 viral copies this assay can detect is 131 copies/mL. A negative result does not preclude SARS-Cov-2 infection and should not be used as the sole basis for treatment or other patient management decisions. A negative result may occur with  improper specimen collection/handling, submission of specimen other than nasopharyngeal swab, presence of viral mutation(s) within the areas targeted by this assay, and inadequate number of viral copies (<131 copies/mL). A  negative result must be combined with clinical observations, patient history, and epidemiological information. The expected result is Negative. Fact Sheet for Patients:  PinkCheek.be Fact Sheet for Healthcare Providers:  GravelBags.it This test is not yet ap proved or cleared by the [place] FDA and  has been authorized for detection and/or diagnosis of SARS-CoV-2 by FDA under an Emergency Use Authorization (EUA). This EUA will remain  in effect (meaning this test can be used) for the duration of the COVID-19 declaration under Section 564(b)(1) of the Act, 21 U.S.C. section 360bbb-3(b)(1), unless the authorization is terminated or revoked sooner.    Influenza A by PCR NEGATIVE NEGATIVE Final   Influenza B by PCR NEGATIVE NEGATIVE Final    Comment: (NOTE) The Xpert Xpress SARS-CoV-2/FLU/RSV assay is intended as an aid in  the diagnosis of influenza from Nasopharyngeal swab specimens and  should not be used as a sole basis for treatment. Nasal washings and  aspirates are unacceptable for Xpert Xpress SARS-CoV-2/FLU/RSV  testing. Fact Sheet for Patients: PinkCheek.be Fact Sheet for Healthcare Providers: GravelBags.it This test is not yet approved or cleared by the [place] FDA and  has been authorized for detection and/or diagnosis of SARS-CoV-2 by  FDA under an Emergency Use Authorization (EUA). This EUA will remain  in effect (meaning this test can be used) for the duration of the  Covid-19 declaration under Section 564(b)(1) of the Act, 21  U.S.C. section 360bbb-3(b)(1), unless the authorization is  terminated or revoked. Performed at [facility], [place] [address] [place], [place] [address]     Time coordinating discharge: Over 30 minutes  SIGNED:  Little Ishikawa, DO [facility] 05/21/2019, 11:35 AM

## 2019-05-21 NOTE — Progress Notes (Signed)
Subjective: CC: Doing well. No abdominal pain, n/v. Last BM recorded last night.   Objective: Vital signs in last 24 hours: Temp:  [97.8 F (36.6 C)-99.3 F (37.4 C)] 98.1 F (36.7 C) (05/13 0450) Pulse Rate:  [51-54] 54 (05/13 0450) Resp:  [14-17] 16 (05/13 0450) BP: (129-147)/(72-79) 133/72 (05/13 0450) SpO2:  [92 %-98 %] 98 % (05/13 0450) Last BM Date: 05/20/19  Intake/Output from previous day: 05/12 0701 - 05/13 0700 In: 1954.7 [P.O.:461; I.V.:1444.8; IV Piggyback:48.9] Out: 350 [Urine:350] Intake/Output this shift: No intake/output data recorded.  PE: Gen: Alert, NAD, pleasant Pulm:Rate andeffort normal Abd: Soft, ND, minimal tenderness of the suprapubic abdomen.+BS Ext: No LE edema Skin: no rashes noted, warm and dry  Lab Results:  Recent Labs    05/19/19 0150 05/20/19 0130  WBC 7.5 5.8  HGB 13.5 13.0  HCT 39.5 38.1*  PLT 98* 110*   BMET Recent Labs    05/19/19 0150 05/20/19 0130  NA 135 138  K 3.5 3.5  CL 105 104  CO2 21* 22  GLUCOSE 95 92  BUN 9 9  CREATININE 0.99 0.94  CALCIUM 8.6* 8.8*   PT/INR No results for input(s): LABPROT, INR in the last 72 hours. CMP     Component Value Date/Time   NA 138 05/20/2019 0130   K 3.5 05/20/2019 0130   CL 104 05/20/2019 0130   CO2 22 05/20/2019 0130   GLUCOSE 92 05/20/2019 0130   BUN 9 05/20/2019 0130   CREATININE 0.94 05/20/2019 0130   CREATININE 1.14 02/04/2019 1421   CALCIUM 8.8 (L) 05/20/2019 0130   PROT 5.4 (L) 05/20/2019 0130   ALBUMIN 2.8 (L) 05/20/2019 0130   AST 11 (L) 05/20/2019 0130   AST 12 (L) 02/04/2019 1421   ALT 11 05/20/2019 0130   ALT 9 02/04/2019 1421   ALKPHOS 52 05/20/2019 0130   BILITOT 1.2 05/20/2019 0130   BILITOT 0.6 02/04/2019 1421   GFRNONAA >60 05/20/2019 0130   GFRNONAA >60 02/04/2019 1421   GFRAA >60 05/20/2019 0130   GFRAA >60 02/04/2019 1421   Lipase  No results found for: LIPASE     Studies/Results: No results  found.  Anti-infectives: Anti-infectives (From admission, onward)   Start     Dose/Rate Route Frequency Ordered Stop   05/18/19 0615  vancomycin (VANCOREADY) IVPB 1500 mg/300 mL  Status:  Discontinued     1,500 mg 150 mL/hr over 120 Minutes Intravenous Every 12 hours 05/17/19 1801 05/17/19 1806   05/18/19 0600  cefTRIAXone (ROCEPHIN) 2 g in sodium chloride 0.9 % 100 mL IVPB  Status:  Discontinued     2 g 200 mL/hr over 30 Minutes Intravenous Every 12 hours 05/17/19 1801 05/17/19 1806   05/17/19 2200  piperacillin-tazobactam (ZOSYN) IVPB 3.375 g     3.375 g 12.5 mL/hr over 240 Minutes Intravenous Every 8 hours 05/17/19 1932     05/17/19 2045  ceFEPIme (MAXIPIME) 2 g in sodium chloride 0.9 % 100 mL IVPB  Status:  Discontinued     2 g 200 mL/hr over 30 Minutes Intravenous  Once 05/17/19 2033 05/17/19 2034   05/17/19 2045  metroNIDAZOLE (FLAGYL) IVPB 500 mg  Status:  Discontinued     500 mg 100 mL/hr over 60 Minutes Intravenous Every 8 hours 05/17/19 2033 05/17/19 2034   05/17/19 2000  ampicillin (OMNIPEN) 2 g in sodium chloride 0.9 % 100 mL IVPB  Status:  Discontinued     2 g 300 mL/hr  over 20 Minutes Intravenous Every 4 hours 05/17/19 1801 05/17/19 1806   05/17/19 1815  metroNIDAZOLE (FLAGYL) IVPB 500 mg     500 mg 100 mL/hr over 60 Minutes Intravenous  Once 05/17/19 1801 05/17/19 1942   05/17/19 1815  cefTRIAXone (ROCEPHIN) 1 g in sodium chloride 0.9 % 100 mL IVPB     1 g 200 mL/hr over 30 Minutes Intravenous  Once 05/17/19 1801 05/17/19 1909   05/17/19 1815  acyclovir (ZOVIRAX) 995 mg in dextrose 5 % 150 mL IVPB  Status:  Discontinued     10 mg/kg  99.3 kg (Adjusted) 169.9 mL/hr over 60 Minutes Intravenous Every 8 hours 05/17/19 1801 05/17/19 1806   05/17/19 1800  vancomycin (VANCOCIN) 2,500 mg in sodium chloride 0.9 % 500 mL IVPB  Status:  Discontinued     2,500 mg 250 mL/hr over 120 Minutes Intravenous  Once 05/17/19 1801 05/17/19 1806   05/17/19 1630  cefTRIAXone (ROCEPHIN) 1 g  in sodium chloride 0.9 % 100 mL IVPB     1 g 200 mL/hr over 30 Minutes Intravenous  Once 05/17/19 1625 05/17/19 1722       Assessment/Plan HTN HLD CAD s/p CABG  Acute sigmoid diverticulitis with small focus of gas adjacent to this that is contained - Cont abx. Can switch over to oral Augmentin. Will need 10-14d total of abx - No current plans for emergency surgery at this time. We will continue to follow along with you - Adv to soft diet. If tolerates can be discharged from our standpoint.  FEN - Soft VTE -SCDs, Lovenox ID -Zosyn 5/9 >> WBCwnl at 5.8 (5/12)   LOS: 4 days    Jillyn Ledger , [facility] 05/21/2019, 9:14 AM Please see Amion for pager number during day hours 7:00am-4:30pm

## 2019-05-21 NOTE — Plan of Care (Signed)
  Problem: Education: Goal: Knowledge of General Education information will improve Description: Including pain rating scale, medication(s)/side effects and non-pharmacologic comfort measures Outcome: Progressing   Problem: Health Behavior/Discharge Planning: Goal: Ability to manage health-related needs will improve Outcome: Progressing   Problem: Activity: Goal: Risk for activity intolerance will decrease Outcome: Progressing   Problem: Nutrition: Goal: Adequate nutrition will be maintained Outcome: Progressing   Problem: Elimination: Goal: Will not experience complications related to bowel motility Outcome: Progressing Goal: Will not experience complications related to urinary retention Outcome: Progressing   Problem: Pain Managment: Goal: General experience of comfort will improve Outcome: Progressing   Problem: Safety: Goal: Ability to remain free from injury will improve Outcome: Progressing   Problem: Skin Integrity: Goal: Risk for impaired skin integrity will decrease Outcome: Progressing   

## 2019-05-21 NOTE — Progress Notes (Signed)
Physical Therapy Treatment Patient Details Name: David Hubbard MRN: [id number] DOB: October 08, 1940 Today's Date: 05/21/2019    History of Present Illness Pt is a 79 y/o male with PMH of diverticulitis, nephrolithiasis, CKDIII, HTN, sleep apnea, B-cell lymphoma, depression, CAD s/p CABG, anxiety, chronic LBP who presented to ER with AMS. Found to be febrile. CT negative for acute findings,  but notable for moderate cerebral atrophy and chronic right maxillary sinusitis.CT abdomen/pelvis was also performed which reveals proximal sigmoid diverticulitis with adjacent 1.3 cm focus of contained free air. Admitted for sepsis due to acute sigmoid diverticulitis.     PT Comments    Patient progressing this session able to initiate toileting prior to ambulation without cues.  Still soiled some in bed.  Performed standing ADL's at sink with S.  Tolerated activites including hallway ambulation without any issues.  He is eager for home.  Reports already has a walker.  PT to follow until d/c, continue to recommend HHPT at d/c.    Follow Up Recommendations  Home health PT;Supervision/Assistance - 24 hour     Equipment Recommendations  None recommended by PT(reports he has a walker at home)    Recommendations for Other Services       Precautions / Restrictions Precautions Precautions: Fall    Mobility  Bed Mobility Overal bed mobility: Modified Independent             General bed mobility comments: noted had some fecal incontinence in bed  Transfers Overall transfer level: Needs assistance Equipment used: Rolling walker (2 wheeled) Transfers: Sit to/from Stand Sit to Stand: Min guard         General transfer comment: assist for hygiene once standing,  Ambulation/Gait Ambulation/Gait assistance: Min guard;Supervision Gait Distance (Feet): 180 Feet Assistive device: Rolling walker (2 wheeled) Gait Pattern/deviations: Step-through pattern;Decreased stride length     General Gait  Details: into bathroom to start session then at sink brushed teeth, and hair, then walked in hallway w/ RW and close S   Stairs             Wheelchair Mobility    Modified Rankin (Stroke Patients Only)       Balance Overall balance assessment: Needs assistance   Sitting balance-Leahy Scale: Good     Standing balance support: During functional activity;No upper extremity supported Standing balance-Leahy Scale: Fair Standing balance comment: standing at sink for activities in bathroom close S no LOB                            Cognition Arousal/Alertness: Awake/alert Behavior During Therapy: Flat affect Overall Cognitive Status: Within Functional Limits for tasks assessed                                        Exercises      General Comments        Pertinent Vitals/Pain Pain Assessment: No/denies pain    Home Living                      Prior Function            PT Goals (current goals can now be found in the care plan section) Progress towards PT goals: Progressing toward goals    Frequency    Min 3X/week      PT Plan Current plan remains appropriate  Co-evaluation              AM-PAC PT "6 Clicks" Mobility   Outcome Measure  Help needed turning from your back to your side while in a flat bed without using bedrails?: None Help needed moving from lying on your back to sitting on the side of a flat bed without using bedrails?: None Help needed moving to and from a bed to a chair (including a wheelchair)?: A Little Help needed standing up from a chair using your arms (e.g., wheelchair or bedside chair)?: A Little Help needed to walk in hospital room?: A Little Help needed climbing 3-5 steps with a railing? : A Little 6 Click Score: 20    End of Session Equipment Utilized During Treatment: Gait belt Activity Tolerance: Patient tolerated treatment well Patient left: in chair;with call bell/phone  within reach   PT Visit Diagnosis: Muscle weakness (generalized) (M62.81);Other abnormalities of gait and mobility (R26.89)     Time: [id number] PT Time Calculation (min) (ACUTE ONLY): 23 min  Charges:  $Gait Training: 8-22 mins $Therapeutic Activity: 8-22 mins                     Magda Kiel, [place] [phone] 05/21/2019    Reginia Naas 05/21/2019, 1:43 PM

## 2019-05-21 NOTE — TOC Progression Note (Signed)
Transition of Care [facility]) - Progression Note    Patient Details  Name: ANJAN ETCHEVERRY MRN: [id number] Date of Birth: May 06, 1940  [facility] CM/SW Contact  Jacalyn Lefevre Edson Snowball, RN Phone Number: 05/21/2019, 10:34 AM  Clinical Narrative:     No visitors at bedside. Called Vivien Rota [place] [phone] and left message awaiting call back.  Expected Discharge Plan: [place] Barriers to Discharge: Continued Medical Work up  Expected Discharge Plan and Services Expected Discharge Plan: [place] In-house Referral: Clinical Social Work Discharge Planning Services: CM Consult Post Acute Care Choice: Durable Medical Equipment, Home Health Living arrangements for the past 2 months: Single Family Home                           HH Arranged: PT, OT, Refused SNF           Social Determinants of Health (SDOH) Interventions    Readmission Risk Interventions Readmission Risk Prevention Plan 05/20/2019  Post Dischage Appt Complete  Medication Screening Complete  Transportation Screening Complete  Some recent data might be hidden

## 2019-05-21 NOTE — Progress Notes (Signed)
AVS given and reviewed with pt and pt's girlfriend, Vivien Rota. Medications discussed. All questions answered to satisfaction. Pt and Vivien Rota verbalized understanding of information given. Pt to be escorted off the unit with all belongings via wheelchair by volunteer services.

## 2019-05-21 NOTE — Care Management (Signed)
Per Mee Hives PT patient cannot use a rollator safely.   Magdalen Spatz RN

## 2019-05-22 LAB — CULTURE, BLOOD (ROUTINE X 2)
Culture: NO GROWTH
Culture: NO GROWTH
Special Requests: ADEQUATE

## 2019-06-19 ENCOUNTER — Other Ambulatory Visit: Payer: Self-pay | Admitting: Hematology & Oncology

## 2020-01-15 ENCOUNTER — Other Ambulatory Visit: Payer: Self-pay | Admitting: Hematology & Oncology

## 2020-04-22 ENCOUNTER — Other Ambulatory Visit: Payer: Self-pay | Admitting: Hematology & Oncology

## 2020-04-28 ENCOUNTER — Telehealth: Payer: Self-pay | Admitting: *Deleted

## 2020-04-28 NOTE — Telephone Encounter (Signed)
Per scheduling message David Hubbard - gave upcoming appointments

## 2020-05-04 ENCOUNTER — Other Ambulatory Visit: Payer: Self-pay | Admitting: *Deleted

## 2020-05-04 DIAGNOSIS — C8308 Small cell B-cell lymphoma, lymph nodes of multiple sites: Secondary | ICD-10-CM

## 2020-05-04 DIAGNOSIS — C8299 Follicular lymphoma, unspecified, extranodal and solid organ sites: Secondary | ICD-10-CM

## 2020-05-04 DIAGNOSIS — R112 Nausea with vomiting, unspecified: Secondary | ICD-10-CM

## 2020-05-05 ENCOUNTER — Inpatient Hospital Stay: Payer: Medicare HMO | Admitting: Hematology & Oncology

## 2020-05-05 ENCOUNTER — Inpatient Hospital Stay: Payer: Medicare HMO

## 2020-05-05 ENCOUNTER — Telehealth: Payer: Self-pay

## 2020-05-05 NOTE — Telephone Encounter (Signed)
Pt called and r/s todays appts as he went to the dermatologist today and had to have several things removed and has several stitches.  Arohi Salvatierra

## 2020-05-11 ENCOUNTER — Other Ambulatory Visit: Payer: Self-pay

## 2020-05-11 ENCOUNTER — Inpatient Hospital Stay: Payer: Medicare HMO | Attending: Hematology & Oncology

## 2020-05-11 ENCOUNTER — Encounter: Payer: Self-pay | Admitting: Hematology & Oncology

## 2020-05-11 ENCOUNTER — Inpatient Hospital Stay (HOSPITAL_BASED_OUTPATIENT_CLINIC_OR_DEPARTMENT_OTHER): Payer: Medicare HMO | Admitting: Hematology & Oncology

## 2020-05-11 VITALS — BP 121/58 | HR 57 | Temp 98.4°F | Resp 19 | Wt 286.0 lb

## 2020-05-11 DIAGNOSIS — C8300 Small cell B-cell lymphoma, unspecified site: Secondary | ICD-10-CM | POA: Diagnosis present

## 2020-05-11 DIAGNOSIS — C8308 Small cell B-cell lymphoma, lymph nodes of multiple sites: Secondary | ICD-10-CM | POA: Diagnosis not present

## 2020-05-11 DIAGNOSIS — C8299 Follicular lymphoma, unspecified, extranodal and solid organ sites: Secondary | ICD-10-CM

## 2020-05-11 DIAGNOSIS — C4492 Squamous cell carcinoma of skin, unspecified: Secondary | ICD-10-CM | POA: Diagnosis not present

## 2020-05-11 DIAGNOSIS — Z9221 Personal history of antineoplastic chemotherapy: Secondary | ICD-10-CM | POA: Insufficient documentation

## 2020-05-11 DIAGNOSIS — R112 Nausea with vomiting, unspecified: Secondary | ICD-10-CM

## 2020-05-11 LAB — CBC WITH DIFFERENTIAL (CANCER CENTER ONLY)
Abs Immature Granulocytes: 0.03 10*3/uL (ref 0.00–0.07)
Basophils Absolute: 0 10*3/uL (ref 0.0–0.1)
Basophils Relative: 1 %
Eosinophils Absolute: 0.1 10*3/uL (ref 0.0–0.5)
Eosinophils Relative: 2 %
HCT: 47.1 % (ref 39.0–52.0)
Hemoglobin: 16 g/dL (ref 13.0–17.0)
Immature Granulocytes: 1 %
Lymphocytes Relative: 23 %
Lymphs Abs: 1.5 10*3/uL (ref 0.7–4.0)
MCH: 31.9 pg (ref 26.0–34.0)
MCHC: 34 g/dL (ref 30.0–36.0)
MCV: 94 fL (ref 80.0–100.0)
Monocytes Absolute: 0.6 10*3/uL (ref 0.1–1.0)
Monocytes Relative: 10 %
Neutro Abs: 4 10*3/uL (ref 1.7–7.7)
Neutrophils Relative %: 63 %
Platelet Count: 140 10*3/uL — ABNORMAL LOW (ref 150–400)
RBC: 5.01 MIL/uL (ref 4.22–5.81)
RDW: 13.7 % (ref 11.5–15.5)
WBC Count: 6.3 10*3/uL (ref 4.0–10.5)
nRBC: 0 % (ref 0.0–0.2)

## 2020-05-11 LAB — CMP (CANCER CENTER ONLY)
ALT: 13 U/L (ref 0–44)
AST: 13 U/L — ABNORMAL LOW (ref 15–41)
Albumin: 3.9 g/dL (ref 3.5–5.0)
Alkaline Phosphatase: 78 U/L (ref 38–126)
Anion gap: 8 (ref 5–15)
BUN: 16 mg/dL (ref 8–23)
CO2: 28 mmol/L (ref 22–32)
Calcium: 9.9 mg/dL (ref 8.9–10.3)
Chloride: 103 mmol/L (ref 98–111)
Creatinine: 1.07 mg/dL (ref 0.61–1.24)
GFR, Estimated: >60 mL/min (ref >60)
Glucose, Bld: 112 mg/dL — ABNORMAL HIGH (ref 70–99)
Potassium: 4.2 mmol/L (ref 3.5–5.1)
Sodium: 139 mmol/L (ref 135–145)
Total Bilirubin: 0.5 mg/dL (ref 0.3–1.2)
Total Protein: 6.4 g/dL — ABNORMAL LOW (ref 6.5–8.1)

## 2020-05-11 LAB — LACTATE DEHYDROGENASE: LDH: 118 U/L (ref 98–192)

## 2020-05-11 NOTE — Progress Notes (Signed)
Hematology and Oncology Follow Up Visit  VONTE ROSSIN [id number] 02-23-1940 80 y.o. 05/11/2020   Principle Diagnosis:   Diffuse small lymphocytic lymphoma  Current Therapy:    Bendamustine -- s/p cycle #4- start on 10/02/2018 -completed on 12/12/2018     Interim History:  Mr. Pascal is back for a long awaited follow-up.  We do not see him for a little over a year.  He has been doing okay.  Recently, his daughter sent [place] a picture of a lesion on his left anterior chest wall.  He ultimately went to a dermatologist.  He had a wide local excision of this lesion.  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  He seems to be doing pretty well.  He has had no problems with infections.  He has had no problems with cough or shortness of breath.  He has had his COVID-vaccine and boosters.  There is been no change in bowel or bladder habits.  So far, has been no evidence that he has had any recurrence of the lymphoma.  His last CT scan was done on 05/17/2019.  This was of the abdomen pelvis.  He has diverticulitis.  There is no mention of adenopathy.  Currently, I would have to say his performance status is ECOG 2.    Medications:  Current Outpatient Medications:  .  ammonium lactate (LAC-HYDRIN) 12 % lotion, Apply 1 application topically 2 (two) times daily., Disp: , Rfl:  .  aspirin EC 81 MG tablet, Take 81 mg by mouth daily., Disp: , Rfl:  .  cephALEXin (KEFLEX) 500 MG capsule, Take 500 mg by mouth 2 (two) times daily., Disp: , Rfl:  .  clobetasol cream (TEMOVATE) 2.44 %, Apply 1 application topically 2 (two) times daily., Disp: , Rfl:  .  clotrimazole (LOTRIMIN) 1 % cream, Apply 1 application topically daily. Apply to places on scalp, Disp: , Rfl:  .  donepezil (ARICEPT) 10 MG tablet, Take 10 mg by mouth at bedtime., Disp: , Rfl:  .  Evolocumab (REPATHA SURECLICK) 010 MG/ML SOAJ, Inject 140 mg into the  skin every 14 (fourteen) days. Every other Sunday, Disp: , Rfl:  .  famciclovir (FAMVIR) 250 MG tablet, TAKE 1 TABLET(250 MG) BY MOUTH DAILY, Disp: 30 tablet, Rfl: 6 .  FLUoxetine (PROZAC) 10 MG capsule, Take 10 mg by mouth daily., Disp: , Rfl:  .  memantine (NAMENDA) 10 MG tablet, Take 10 mg by mouth., Disp: , Rfl:   Allergies:  Allergies  Allergen Reactions  . Iodinated Diagnostic Agents Nausea And Vomiting    Pt now reports he experiences nausea and vomiting with IV contrast agents after contrast was given tonight    . Statins Other (See Comments)    myalgias    Past Medical History, Surgical history, Social history, and Family History were reviewed and updated.  Review of Systems: Review of Systems  Unable to perform ROS: Age  Constitutional: Negative.   HENT:  Negative.   Eyes: Negative.   Respiratory: Negative.   Cardiovascular: Negative.   Gastrointestinal: Negative.   Endocrine: Negative.   Genitourinary: Negative.    Musculoskeletal: Negative.   Skin: Negative.   Neurological: Negative.   Hematological: Negative.   Psychiatric/Behavioral: Negative.     Physical Exam:  weight is 286 lb (129.7 kg). His oral temperature is 98.4 F (36.9 C). His blood pressure is 121/58 (abnormal) and his  pulse is 57 (abnormal). His respiration is 19 and oxygen saturation is 98%.   Wt Readings from Last 3 Encounters:  05/11/20 286 lb (129.7 kg)  05/17/19 274 lb 4 oz (124.4 kg)  02/04/19 272 lb (123.4 kg)    Physical Exam Vitals reviewed.  HENT:     Head: Normocephalic and atraumatic.  Eyes:     Pupils: Pupils are equal, round, and reactive to light.  Cardiovascular:     Rate and Rhythm: Normal rate and regular rhythm.     Heart sounds: Normal heart sounds.  Pulmonary:     Effort: Pulmonary effort is normal.     Breath sounds: Normal breath sounds.  Abdominal:     General: Bowel sounds are normal.     Palpations: Abdomen is soft.  Musculoskeletal:        General: No  tenderness or deformity. Normal range of motion.     Cervical back: Normal range of motion.  Lymphadenopathy:     Cervical: No cervical adenopathy.  Skin:    General: Skin is warm and dry.     Findings: No erythema or rash.     Comments: On the left anterior chest wall, he has a incisional scar.  This is healing.  There is little bit of erythema associated with this.  There is no exudate.  Above his left, there is another excisional scar.  This is healing.  Neurological:     Mental Status: He is alert and oriented to person, place, and time.  Psychiatric:        Behavior: Behavior normal.        Thought Content: Thought content normal.        Judgment: Judgment normal.      Lab Results  Component Value Date   WBC 6.3 05/11/2020   HGB 16.0 05/11/2020   HCT 47.1 05/11/2020   MCV 94.0 05/11/2020   PLT 140 (L) 05/11/2020     Chemistry      Component Value Date/Time   NA 139 05/11/2020 1206   K 4.2 05/11/2020 1206   CL 103 05/11/2020 1206   CO2 28 05/11/2020 1206   BUN 16 05/11/2020 1206   CREATININE 1.07 05/11/2020 1206      Component Value Date/Time   CALCIUM 9.9 05/11/2020 1206   ALKPHOS 78 05/11/2020 1206   AST 13 (L) 05/11/2020 1206   ALT 13 05/11/2020 1206   BILITOT 0.5 05/11/2020 1206       Impression and Plan: Mr. Elsass is a 80 year old white male.  He has a history of progressive diffuse small B-cell lymphocytic lymphoma.  He has had chemotherapy.  He has had the Bendamustine.  He cannot tolerate Rituxan.  He came back basal because of the skin lesions.  He had squamous cell of the skin.  I will have his try to get the pathology reports.  For right now, I know it is hard for him to see [place].  We will certainly have him come back as needed.  By his blood work and by his blood smear everything looks fine.  It is always fun to see him.   [name], MD 5/4/202212:59 PM

## 2020-05-12 ENCOUNTER — Telehealth: Payer: Self-pay

## 2020-05-12 LAB — IGG, IGA, IGM
IgA: 124 mg/dL (ref 61–437)
IgG (Immunoglobin G), Serum: 774 mg/dL (ref 603–1613)
IgM (Immunoglobulin M), Srm: 27 mg/dL (ref 15–143)

## 2020-05-12 LAB — KAPPA/LAMBDA LIGHT CHAINS
Kappa free light chain: 19.1 mg/L (ref 3.3–19.4)
Kappa, lambda light chain ratio: 1.09 (ref 0.26–1.65)
Lambda free light chains: 17.6 mg/L (ref 5.7–26.3)

## 2020-05-12 LAB — BETA 2 MICROGLOBULIN, SERUM: Beta-2 Microglobulin: 2 mg/L (ref 0.6–2.4)

## 2020-05-12 NOTE — Telephone Encounter (Signed)
No 05/11/20 los   Kersti Scavone

## 2020-05-13 LAB — PROTEIN ELECTROPHORESIS, SERUM
A/G Ratio: 1.4 (ref 0.7–1.7)
Albumin ELP: 3.6 g/dL (ref 2.9–4.4)
Alpha-1-Globulin: 0.2 g/dL (ref 0.0–0.4)
Alpha-2-Globulin: 0.8 g/dL (ref 0.4–1.0)
Beta Globulin: 0.9 g/dL (ref 0.7–1.3)
Gamma Globulin: 0.7 g/dL (ref 0.4–1.8)
Globulin, Total: 2.6 g/dL (ref 2.2–3.9)
Total Protein ELP: 6.2 g/dL (ref 6.0–8.5)

## 2021-01-09 ENCOUNTER — Emergency Department (HOSPITAL_COMMUNITY): Payer: Medicare HMO

## 2021-01-09 ENCOUNTER — Encounter (HOSPITAL_COMMUNITY): Payer: Self-pay | Admitting: *Deleted

## 2021-01-09 ENCOUNTER — Other Ambulatory Visit: Payer: Self-pay

## 2021-01-09 ENCOUNTER — Emergency Department (HOSPITAL_COMMUNITY)
Admission: EM | Admit: 2021-01-09 | Discharge: 2021-01-09 | Disposition: A | Discharge status: Home / Self Care | Payer: Medicare HMO | Attending: Emergency Medicine | Admitting: Emergency Medicine

## 2021-01-09 DIAGNOSIS — R4182 Altered mental status, unspecified: Secondary | ICD-10-CM | POA: Insufficient documentation

## 2021-01-09 DIAGNOSIS — Z7982 Long term (current) use of aspirin: Secondary | ICD-10-CM | POA: Insufficient documentation

## 2021-01-09 DIAGNOSIS — U071 COVID-19: Secondary | ICD-10-CM | POA: Insufficient documentation

## 2021-01-09 LAB — COMPREHENSIVE METABOLIC PANEL
ALT: 12 U/L (ref 0–44)
AST: 18 U/L (ref 15–41)
Albumin: 3.4 g/dL — ABNORMAL LOW (ref 3.5–5.0)
Alkaline Phosphatase: 66 U/L (ref 38–126)
Anion gap: 11 (ref 5–15)
BUN: 11 mg/dL (ref 8–23)
CO2: 21 mmol/L — ABNORMAL LOW (ref 22–32)
Calcium: 8.9 mg/dL (ref 8.9–10.3)
Chloride: 102 mmol/L (ref 98–111)
Creatinine, Ser: 1.25 mg/dL — ABNORMAL HIGH (ref 0.61–1.24)
GFR, Estimated: 59 mL/min — ABNORMAL LOW (ref >60)
Glucose, Bld: 104 mg/dL — ABNORMAL HIGH (ref 70–99)
Potassium: 3.9 mmol/L (ref 3.5–5.1)
Sodium: 134 mmol/L — ABNORMAL LOW (ref 135–145)
Total Bilirubin: 0.9 mg/dL (ref 0.3–1.2)
Total Protein: 6 g/dL — ABNORMAL LOW (ref 6.5–8.1)

## 2021-01-09 LAB — CBC WITH DIFFERENTIAL/PLATELET
Abs Immature Granulocytes: 0.01 10*3/uL (ref 0.00–0.07)
Basophils Absolute: 0 10*3/uL (ref 0.0–0.1)
Basophils Relative: 0 %
Eosinophils Absolute: 0.1 10*3/uL (ref 0.0–0.5)
Eosinophils Relative: 1 %
HCT: 40.9 % (ref 39.0–52.0)
Hemoglobin: 13.5 g/dL (ref 13.0–17.0)
Immature Granulocytes: 0 %
Lymphocytes Relative: 16 %
Lymphs Abs: 0.7 10*3/uL (ref 0.7–4.0)
MCH: 28.8 pg (ref 26.0–34.0)
MCHC: 33 g/dL (ref 30.0–36.0)
MCV: 87.2 fL (ref 80.0–100.0)
Monocytes Absolute: 0.8 10*3/uL (ref 0.1–1.0)
Monocytes Relative: 18 %
Neutro Abs: 2.9 10*3/uL (ref 1.7–7.7)
Neutrophils Relative %: 65 %
Platelets: 99 10*3/uL — ABNORMAL LOW (ref 150–400)
RBC: 4.69 MIL/uL (ref 4.22–5.81)
RDW: 16.6 % — ABNORMAL HIGH (ref 11.5–15.5)
Smear Review: DECREASED
WBC: 4.5 10*3/uL (ref 4.0–10.5)
nRBC: 0 % (ref 0.0–0.2)

## 2021-01-09 LAB — URINALYSIS, ROUTINE W REFLEX MICROSCOPIC
Bacteria, UA: NONE SEEN
Bilirubin Urine: NEGATIVE
Glucose, UA: NEGATIVE mg/dL
Ketones, ur: NEGATIVE mg/dL
Leukocytes,Ua: NEGATIVE
Nitrite: NEGATIVE
Protein, ur: NEGATIVE mg/dL
Specific Gravity, Urine: 1.015 (ref 1.005–1.030)
pH: 6 (ref 5.0–8.0)

## 2021-01-09 LAB — RESP PANEL BY RT-PCR (FLU A&B, COVID) ARPGX2
Influenza A by PCR: NEGATIVE
Influenza B by PCR: NEGATIVE
SARS Coronavirus 2 by RT PCR: POSITIVE — AB

## 2021-01-09 MED ORDER — NIRMATRELVIR/RITONAVIR (PAXLOVID) TABLET (RENAL DOSING): 2.0000 | ORAL_TABLET | Freq: Two times a day (BID) | ORAL | 0 refills | Status: AC

## 2021-01-09 MED ORDER — SODIUM CHLORIDE 0.9 % IV BOLUS
500.0000 mL | Freq: Once | INTRAVENOUS | Status: AC
  Administered 2021-01-09: 500 mL via INTRAVENOUS

## 2021-01-09 NOTE — ED Triage Notes (Signed)
Patient presents to ED via [facility] states patient was at his normal base line yest went out to eat with family has a history of dementia. The am approx. 5am patient was found to be sitting in a different chair than he is normally sitting. Per family patient was warm to touch. And was unable to stand. Will not talk , however upon arrival to ed patient was able to answer questions. Patient yells every time he is moved.

## 2021-01-09 NOTE — ED Notes (Signed)
Condom cath is in place

## 2021-01-09 NOTE — ED Notes (Signed)
Patients daughter verbalizes understanding of discharge instructions. Opportunity for questioning and answers were provided. Armband removed by staff, pt discharged from ED via [place].

## 2021-01-09 NOTE — ED Provider Notes (Signed)
[facility] EMERGENCY DEPARTMENT Provider Note   CSN: [id number] Arrival date & time: 01/09/21  1028     History  Chief Complaint  Patient presents with   Altered Mental Status    David Hubbard is a 81 y.o. male.   Altered Mental Status Level 5 caveat due to altered mental status.  Reportedly brought in for mental status changes.  Decreased mental status.  Reportedly sat in the wrong chair last night.  Was more confused again this morning.  Does have history of some dementia but now just more yelling.    Home Medications Prior to Admission medications   Medication Sig Start Date End Date Taking? Authorizing Provider  aspirin EC 81 MG tablet Take 81 mg by mouth daily.   Yes [provider]  donepezil (ARICEPT) 10 MG tablet Take 10 mg by mouth at bedtime. 03/31/19  Yes [provider]  escitalopram (LEXAPRO) 10 MG tablet Take 10 mg by mouth daily. 12/08/20  Yes [provider]  Evolocumab (REPATHA SURECLICK) 409 MG/ML SOAJ Inject 140 mg into the skin every 14 (fourteen) days. Every other Sunday 09/03/16  Yes [provider]  memantine (NAMENDA) 5 MG tablet Take 5 mg by mouth at bedtime. 09/02/20  Yes [provider]  nirmatrelvir/ritonavir EUA, renal dosing, (PAXLOVID) 10 x 150 MG & 10 x 100MG  TABS Take 2 tablets by mouth 2 (two) times daily for 5 days. Patient GFR is 59. Take nirmatrelvir (150 mg) one tablet twice daily for 5 days and ritonavir (100 mg) one tablet twice daily for 5 days. 01/09/21 01/14/21 Yes David Belling, MD  pantoprazole (PROTONIX) 40 MG tablet Take 40 mg by mouth 2 (two) times daily. 12/02/20  Yes [provider]  famciclovir (FAMVIR) 250 MG tablet TAKE 1 TABLET(250 MG) BY MOUTH DAILY Patient not taking: Reported on 01/09/2021 04/22/20   [name], MD      Allergies    Iodinated contrast media, Rituximab, and Statins    Review of Systems   Review of Systems  Unable to perform ROS:  Mental status change   Physical Exam Updated Vital Signs BP (!) 143/70    Pulse 69    Temp 98.1 F (36.7 C) (Oral)    Resp (!) 30    Ht 6\' 2"  (1.88 m)    Wt 123.4 kg    SpO2 93%    BMI 34.92 kg/m  Physical Exam Vitals and nursing note reviewed.  Eyes:     Pupils: Pupils are equal, round, and reactive to light.  Cardiovascular:     Rate and Rhythm: Regular rhythm.  Pulmonary:     Breath sounds: No wheezing.  Abdominal:     Tenderness: There is no abdominal tenderness.  Musculoskeletal:        General: No tenderness.     Cervical back: Neck supple.  Skin:    General: Skin is warm.     Capillary Refill: Capillary refill takes less than 2 seconds.  Neurological:     Mental Status: He is alert.     Comments: Initially awake but yelling.  Later mental status improved and able to answer more questions.  Does have history of dementia.  Moving all extremities.    ED Results / Procedures / Treatments   Labs (all labs ordered are listed, but only abnormal results are displayed) Labs Reviewed  RESP PANEL BY RT-PCR (FLU A&B, COVID) ARPGX2 - Abnormal; Notable for the following components:  Result Value   SARS Coronavirus 2 by RT PCR POSITIVE (*)    All other components within normal limits  URINALYSIS, ROUTINE W REFLEX MICROSCOPIC - Abnormal; Notable for the following components:   Hgb urine dipstick SMALL (*)    All other components within normal limits  COMPREHENSIVE METABOLIC PANEL - Abnormal; Notable for the following components:   Sodium 134 (*)    CO2 21 (*)    Glucose, Bld 104 (*)    Creatinine, Ser 1.25 (*)    Total Protein 6.0 (*)    Albumin 3.4 (*)    GFR, Estimated 59 (*)    All other components within normal limits  CBC WITH DIFFERENTIAL/PLATELET - Abnormal; Notable for the following components:   RDW 16.6 (*)    Platelets 99 (*)    All other components within normal limits    EKG EKG Interpretation  Date/Time:  Monday January 09 2021 10:41:41  EST Ventricular Rate:  70 PR Interval:  214 QRS Duration: 104 QT Interval:  379 QTC Calculation: 409 R Axis:   30 Text Interpretation: Sinus rhythm Borderline prolonged PR interval Inferior infarct, old No significant change since last tracing Confirmed by David Hubbard [phone]) on 01/09/2021 10:43:01 AM  Radiology CT HEAD WO CONTRAST (5MM)  Result Date: 01/09/2021 CLINICAL DATA:  Patient presents to ED via [facility] states patient was at his normal base line yest went out to eat with family has a history of dementia. The am approx. 5am patient was found to be sitting in a different chair than he is normally sitting. Per family patient was warm to touch. And was unable to stand. Will not talk , however upon arrival to ed patient was able to answer questions. Patient yells every time he is moved. EXAM: CT HEAD WITHOUT CONTRAST TECHNIQUE: Contiguous axial images were obtained from the base of the skull through the vertex without intravenous contrast. COMPARISON:  05/17/2019 FINDINGS: Brain: No evidence of acute infarction, hemorrhage, hydrocephalus, extra-axial collection or mass lesion/mass effect. Ventricular mild sulcal enlargement consistent with mild atrophy, stable. Vascular: No hyperdense vessel or unexpected calcification. Skull: Normal. Negative for fracture or focal lesion. Sinuses/Orbits: Globes and orbits are unremarkable. Near complete opacification of the right maxillary sinus with associated wall thickening, chronic. Remaining sinuses are clear. Other: None. IMPRESSION: 1. No acute intracranial abnormalities. Electronically Signed   By: Lajean Manes M.D.   On: 01/09/2021 11:32   DG Chest Portable 1 View  Result Date: 01/09/2021 CLINICAL DATA:  Altered mental status EXAM: PORTABLE CHEST 1 VIEW COMPARISON:  May 17, 2019 FINDINGS: The heart size and mediastinal contours are stable. The heart size is enlarged. Patchy consolidation of bilateral lung bases are identified. The lung volumes are low.  The visualized skeletal structures are stable. IMPRESSION: Patchy consolidation of bilateral lung bases, pneumonia is not excluded. Electronically Signed   By: Abelardo Diesel M.D.   On: 01/09/2021 11:13    Procedures Procedures    Medications Ordered in ED Medications  sodium chloride 0.9 % bolus 500 mL (500 mLs Intravenous Incomplete 01/09/21 1418)    ED Course/ Medical Decision Making/ A&P                           Medical Decision Making This patient presents to the ED for concern of mental status change., this involves an extensive number of treatment options, and is a complaint that carries with it a high risk of complications and morbidity.  The differential diagnosis includes dehydration, infection, intracranial hemorrhage,   Co morbidities that complicate the patient evaluation  Dementia, non-Hodgkin's lymphoma, myocardial infarction   Additional history obtained:  Additional history obtained from daughter External records from outside source obtained and reviewed including previous discharge.   Lab Tests:  I Ordered, and personally interpreted labs.  The pertinent results include: Mildly elevated creatinine.  Positive COVID.   Imaging Studies ordered:  I ordered imaging studies including chest x-ray I independently visualized and interpreted imaging which showed possible bilateral infiltrates. I agree with the radiologist interpretation   Cardiac Monitoring:  The patient was maintained on a cardiac monitor.  I personally viewed and interpreted the cardiac monitored which showed an underlying rhythm of: Sinus rhythm   Medicines ordered and prescription drug management:  I ordered medication including IV fluids for dehydration. Reevaluation of the patient after these medicines showed that the patient improved I have reviewed the patients home medicines and have made adjustments as needed      Critical Interventions:  IV fluid given.  Will  observe      Problem List / ED Course:  Generalized weakness.  Mild dehydration.  Chest x-ray with possible pneumonia.  Has not really been coughing but does have history dementia.  However COVID test came back positive.  Has been feeling a little worse for last couple days.  I think he is a good candidate for Paxil bid with symptoms likely starting couple days ago.  Reviewed drug interactions and appears done with his medications.  Discussed with patient's daughter.  Will discharge home.  States he has been weak and may need the ambulance to help get him home   Reevaluation:  After the interventions noted above, I reevaluated the patient and found that they have :improved      Dispostion:  After consideration of the diagnostic results and the patients response to treatment, I feel that the patent would benefit from discharge with antiviral treatment..          Final Clinical Impression(s) / ED Diagnoses Final diagnoses:  Altered mental status, unspecified altered mental status type  COVID-19    Rx / DC Orders ED Discharge Orders          Ordered    nirmatrelvir/ritonavir EUA, renal dosing, (PAXLOVID) 10 x 150 MG & 10 x 100MG  TABS  2 times daily        01/09/21 1450              David Belling, MD 01/09/21 [phone]

## 2021-01-09 NOTE — ED Notes (Signed)
[facility] called for transport.  

## 2021-01-09 NOTE — ED Notes (Signed)
Patient placed on NRB , B/P continues to be low Dr. Alvino Chapel. Aware.

## 2021-04-30 ENCOUNTER — Other Ambulatory Visit: Payer: Self-pay | Admitting: Hematology & Oncology

## 2021-05-09 ENCOUNTER — Other Ambulatory Visit: Payer: Self-pay | Admitting: Pharmacist

## 2021-11-09 ENCOUNTER — Other Ambulatory Visit: Payer: Self-pay | Admitting: Hematology & Oncology

## 2022-09-04 ENCOUNTER — Other Ambulatory Visit: Payer: Self-pay | Admitting: Family

## 2022-09-04 DIAGNOSIS — C8308 Small cell B-cell lymphoma, lymph nodes of multiple sites: Secondary | ICD-10-CM

## 2022-09-05 ENCOUNTER — Inpatient Hospital Stay: Payer: Medicare HMO | Attending: Hematology & Oncology

## 2022-09-05 ENCOUNTER — Encounter: Payer: Self-pay | Admitting: Family

## 2022-09-05 ENCOUNTER — Inpatient Hospital Stay (HOSPITAL_BASED_OUTPATIENT_CLINIC_OR_DEPARTMENT_OTHER): Payer: Medicare HMO | Admitting: Family

## 2022-09-05 VITALS — BP 135/104 | HR 70 | Temp 97.5°F | Resp 18 | Wt 300.0 lb

## 2022-09-05 DIAGNOSIS — F0390 Unspecified dementia without behavioral disturbance: Secondary | ICD-10-CM | POA: Insufficient documentation

## 2022-09-05 DIAGNOSIS — Z91041 Radiographic dye allergy status: Secondary | ICD-10-CM | POA: Diagnosis not present

## 2022-09-05 DIAGNOSIS — C8300 Small cell B-cell lymphoma, unspecified site: Secondary | ICD-10-CM | POA: Insufficient documentation

## 2022-09-05 DIAGNOSIS — R059 Cough, unspecified: Secondary | ICD-10-CM | POA: Diagnosis not present

## 2022-09-05 DIAGNOSIS — R221 Localized swelling, mass and lump, neck: Secondary | ICD-10-CM | POA: Insufficient documentation

## 2022-09-05 DIAGNOSIS — C8308 Small cell B-cell lymphoma, lymph nodes of multiple sites: Secondary | ICD-10-CM

## 2022-09-05 DIAGNOSIS — R32 Unspecified urinary incontinence: Secondary | ICD-10-CM | POA: Insufficient documentation

## 2022-09-05 DIAGNOSIS — R0602 Shortness of breath: Secondary | ICD-10-CM | POA: Insufficient documentation

## 2022-09-05 LAB — CMP (CANCER CENTER ONLY)
ALT: 11 U/L (ref 0–44)
AST: 15 U/L (ref 15–41)
Albumin: 4.4 g/dL (ref 3.5–5.0)
Alkaline Phosphatase: 92 U/L (ref 38–126)
Anion gap: 12 (ref 5–15)
BUN: 14 mg/dL (ref 8–23)
CO2: 26 mmol/L (ref 22–32)
Calcium: 9.7 mg/dL (ref 8.9–10.3)
Chloride: 101 mmol/L (ref 98–111)
Creatinine: 1.46 mg/dL — ABNORMAL HIGH (ref 0.61–1.24)
GFR, Estimated: 49 mL/min — ABNORMAL LOW (ref >60)
Glucose, Bld: 115 mg/dL — ABNORMAL HIGH (ref 70–99)
Potassium: 3.9 mmol/L (ref 3.5–5.1)
Sodium: 139 mmol/L (ref 135–145)
Total Bilirubin: 0.4 mg/dL (ref 0.3–1.2)
Total Protein: 6.8 g/dL (ref 6.5–8.1)

## 2022-09-05 LAB — CBC WITH DIFFERENTIAL (CANCER CENTER ONLY)
Abs Immature Granulocytes: 0.03 10*3/uL (ref 0.00–0.07)
Basophils Absolute: 0 10*3/uL (ref 0.0–0.1)
Basophils Relative: 0 %
Eosinophils Absolute: 0.1 10*3/uL (ref 0.0–0.5)
Eosinophils Relative: 1 %
HCT: 48.1 % (ref 39.0–52.0)
Hemoglobin: 15.5 g/dL (ref 13.0–17.0)
Immature Granulocytes: 0 %
Lymphocytes Relative: 47 %
Lymphs Abs: 3.7 10*3/uL (ref 0.7–4.0)
MCH: 29.5 pg (ref 26.0–34.0)
MCHC: 32.2 g/dL (ref 30.0–36.0)
MCV: 91.4 fL (ref 80.0–100.0)
Monocytes Absolute: 0.7 10*3/uL (ref 0.1–1.0)
Monocytes Relative: 8 %
Neutro Abs: 3.6 10*3/uL (ref 1.7–7.7)
Neutrophils Relative %: 44 %
Platelet Count: 119 10*3/uL — ABNORMAL LOW (ref 150–400)
RBC: 5.26 MIL/uL (ref 4.22–5.81)
RDW: 15.7 % — ABNORMAL HIGH (ref 11.5–15.5)
Smear Review: NORMAL
WBC Count: 8.1 10*3/uL (ref 4.0–10.5)
nRBC: 0 % (ref 0.0–0.2)

## 2022-09-05 LAB — LACTATE DEHYDROGENASE: LDH: 131 U/L (ref 98–192)

## 2022-09-05 MED ORDER — PREDNISONE 50 MG PO TABS: ORAL_TABLET | ORAL | 0 refills | Status: DC

## 2022-09-05 MED ORDER — DIPHENHYDRAMINE HCL 50 MG PO TABS: 50.0000 mg | ORAL_TABLET | Freq: Once | ORAL | 0 refills | Status: AC

## 2022-09-05 NOTE — Progress Notes (Signed)
Hematology and Oncology Follow Up Visit  David Hubbard [id number] July 02, 1940 82 y.o. 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  Of note, patient is HOH and has dementia but is able to answer some questions.  He denies pain on exam. No other adenopathy noted.  He has mild SOB with exertion and caregiver states that he coughs frequently when swallowing. No choking reported.  No fever, chills, n/v, cough, rash, dizziness, chest pain, palpitations, abdominal pain or changes in bowel or bladder habits.  He is incontinent of urine.  No swelling, numbness or tingling noted.  No blood loss noted. No abnormal bruising, no petechiae.  Weight is 300 lbs.   ECOG Performance Status: 2 - Symptomatic, <50% confined to bed  Medications:  Allergies as of 09/05/2022       Reactions   Iodinated Contrast Media Nausea And Vomiting   Pt now reports he experiences nausea and vomiting with IV contrast agents after contrast was given tonight    Rituximab Other (See Comments)   Statins Other (See Comments)   myalgias        Medication List        Accurate as of September 05, 2022  2:31 PM. If you have any questions, ask your nurse or doctor.          aspirin EC 81 MG tablet Take 81 mg by mouth daily.   donepezil 10 MG tablet Commonly known as: ARICEPT Take 10 mg by mouth at bedtime.   escitalopram 10 MG tablet Commonly known as: LEXAPRO Take 10 mg by mouth daily.   famciclovir 250 MG tablet Commonly known as: FAMVIR TAKE 1 TABLET(250 MG) BY MOUTH DAILY   famciclovir 250 MG tablet Commonly known as: FAMVIR TAKE 1 TABLET(250 MG) BY MOUTH DAILY   memantine 5 MG tablet Commonly known as:  NAMENDA Take 5 mg by mouth at bedtime.   pantoprazole 40 MG tablet Commonly known as: PROTONIX Take 40 mg by mouth 2 (two) times daily.   Repatha SureClick 140 MG/ML Soaj Generic drug: Evolocumab Inject 140 mg into the skin every 14 (fourteen) days. Every other Sunday        Allergies:  Allergies  Allergen Reactions   Iodinated Contrast Media Nausea And Vomiting    Pt now reports he experiences nausea and vomiting with IV contrast agents after contrast was given tonight     Rituximab Other (See Comments)   Statins Other (See Comments)    myalgias    Past Medical History, Surgical history, Social history, and Family History were reviewed and updated.  Review of Systems: All other 10 point review of systems is negative.   Physical Exam:  vitals were not taken for this visit.   Wt Readings from Last 3 Encounters:  01/09/21 272 lb (123.4 kg)  05/11/20 286 lb (129.7 kg)  05/17/19 274 lb 4 oz (124.4 kg)    Ocular: Sclerae unicteric, pupils equal, round and reactive to light Ear-nose-throat: Oropharynx clear, dentition fair Lymphatic: No cervical or supraclavicular adenopathy Lungs no rales or rhonchi, good excursion bilaterally Heart regular rate and rhythm, no murmur appreciated Abd soft, nontender, positive bowel sounds MSK no focal  spinal tenderness, no joint edema Neuro: non-focal, well-oriented, appropriate affect Breasts: Deferred   Lab Results  Component Value Date   WBC 4.5 01/09/2021   HGB 13.5 01/09/2021   HCT 40.9 01/09/2021   MCV 87.2 01/09/2021   PLT 99 (L) 01/09/2021   No results found for: "FERRITIN", "IRON", "TIBC", "UIBC", "IRONPCTSAT" Lab Results  Component Value Date   RETICCTPCT 0.8 07/11/2011   RBC 4.69 01/09/2021   RETICCTABS 39.7 07/11/2011   Lab Results  Component Value Date   KPAFRELGTCHN 19.1 05/11/2020   LAMBDASER 17.6 05/11/2020   KAPLAMBRATIO 1.09 05/11/2020   Lab Results  Component Value Date   IGGSERUM 774 05/11/2020    IGA 124 05/11/2020   IGMSERUM 27 05/11/2020   Lab Results  Component Value Date   TOTALPROTELP 6.2 05/11/2020   ALBUMINELP 3.6 05/11/2020   A1GS 0.2 05/11/2020   A2GS 0.8 05/11/2020   BETS 0.9 05/11/2020   BETA2SER 0.3 06/11/2014   GAMS 0.7 05/11/2020   MSPIKE Not Observed 05/11/2020   SPEI Comment 05/11/2020     Chemistry      Component Value Date/Time   NA 134 (L) 01/09/2021 1050   K 3.9 01/09/2021 1050   CL 102 01/09/2021 1050   CO2 21 (L) 01/09/2021 1050   BUN 11 01/09/2021 1050   CREATININE 1.25 (H) 01/09/2021 1050   CREATININE 1.07 05/11/2020 1206      Component Value Date/Time   CALCIUM 8.9 01/09/2021 1050   ALKPHOS 66 01/09/2021 1050   AST 18 01/09/2021 1050   AST 13 (L) 05/11/2020 1206   ALT 12 01/09/2021 1050   ALT 13 05/11/2020 1206   BILITOT 0.9 01/09/2021 1050   BILITOT 0.5 05/11/2020 1206       Impression and Plan: David Hubbard is a 82 yo caucasian gentleman with history of progressive diffuse small B-cell lymphocytic lymphoma. He was treated with Bendamustine but could not tolerate Rituxan. Protein studies are pending.  He now has a large mass on the left side of his neck concerning for recurrent lymphoma.  We will get CT scans for restaging, core needle biopsy of mass and PET scan.  Pre medications were also ordered as patient has had n/v with IV contrast in the past.  Follow-up pending results.   [name], NP 8/28/20242:31 PM  Addendum: I was able to speak to the patient's daughter David Hubbard and go over our current plans for work-up of likely recurrent lymphoma. She is in agreement and we will contact her again once results are available. No other questions at this time.

## 2022-09-07 ENCOUNTER — Encounter: Payer: Self-pay | Admitting: *Deleted

## 2022-09-07 ENCOUNTER — Telehealth: Payer: Self-pay | Admitting: *Deleted

## 2022-09-07 LAB — KAPPA/LAMBDA LIGHT CHAINS
Kappa free light chain: 19.1 mg/L (ref 3.3–19.4)
Kappa, lambda light chain ratio: 0.91 (ref 0.26–1.65)
Lambda free light chains: 21 mg/L (ref 5.7–26.3)

## 2022-09-07 LAB — IGG, IGA, IGM
IgA: 108 mg/dL (ref 61–437)
IgG (Immunoglobin G), Serum: 749 mg/dL (ref 603–1613)
IgM (Immunoglobulin M), Srm: 24 mg/dL (ref 15–143)

## 2022-09-07 LAB — BETA 2 MICROGLOBULIN, SERUM: Beta-2 Microglobulin: 2.7 mg/L — ABNORMAL HIGH (ref 0.6–2.4)

## 2022-09-07 NOTE — Telephone Encounter (Signed)
Call received from patient's daughter, David Hubbard requesting instructions for CT scan and PET scan be sent via MyChart.  Directions sent via MyChart per Robin's request.  CT scan scheduled for 09/12/22 at 2:00PM.  Patient is to arrive at 12:00PM to the [facility] radiology department on the first floor.  He is to have nothing to eat or drink four hours prior to CT scan.  Pt will be given contrast to drink upon arrival to radiology at 12:00PM.   PET scan is scheduled for 09/14/22 at 1:30PM at the [facility] Radiology department.  Patient is to arrive at 1:00PM for the PET scan.  Pt is to pick up prescriptions for Benadryl and Prednisone at the [facility] on [address] in [place]. Patient is to have nothing to eat or drink, besides water for six hours prior to PET scan.  Patient is to take Prednisone 50 mg 13 hours, 7 hours, and 1 hour prior to PET scan.  Patient is to also take Benadryl 50 mg by mouth one hour prior to study.

## 2022-09-09 LAB — PROTEIN ELECTROPHORESIS, SERUM
A/G Ratio: 1.3 (ref 0.7–1.7)
Albumin ELP: 3.7 g/dL (ref 2.9–4.4)
Alpha-1-Globulin: 0.3 g/dL (ref 0.0–0.4)
Alpha-2-Globulin: 0.8 g/dL (ref 0.4–1.0)
Beta Globulin: 1 g/dL (ref 0.7–1.3)
Gamma Globulin: 0.7 g/dL (ref 0.4–1.8)
Globulin, Total: 2.9 g/dL (ref 2.2–3.9)
Total Protein ELP: 6.6 g/dL (ref 6.0–8.5)

## 2022-09-11 ENCOUNTER — Telehealth: Payer: Self-pay

## 2022-09-11 NOTE — Telephone Encounter (Signed)
Pt daughter [name] called for directions for pre-meds for his CT scan. Sent copy via [employer]

## 2022-09-11 NOTE — Telephone Encounter (Signed)
Tested my-chart

## 2022-09-12 ENCOUNTER — Ambulatory Visit (HOSPITAL_BASED_OUTPATIENT_CLINIC_OR_DEPARTMENT_OTHER)
Admission: RE | Admit: 2022-09-12 | Discharge: 2022-09-12 | Disposition: A | Discharge status: Home / Self Care | Payer: Medicare HMO | Source: Ambulatory Visit | Attending: Family | Admitting: Family

## 2022-09-12 ENCOUNTER — Encounter (HOSPITAL_BASED_OUTPATIENT_CLINIC_OR_DEPARTMENT_OTHER): Payer: Self-pay

## 2022-09-12 DIAGNOSIS — R221 Localized swelling, mass and lump, neck: Secondary | ICD-10-CM | POA: Insufficient documentation

## 2022-09-12 DIAGNOSIS — Z91041 Radiographic dye allergy status: Secondary | ICD-10-CM | POA: Diagnosis present

## 2022-09-12 DIAGNOSIS — C8308 Small cell B-cell lymphoma, lymph nodes of multiple sites: Secondary | ICD-10-CM | POA: Insufficient documentation

## 2022-09-12 MED ORDER — IOHEXOL 300 MG/ML  SOLN: 100.0000 mL | Freq: Once | INTRAMUSCULAR | Status: DC | PRN

## 2022-09-12 MED ORDER — IOHEXOL 300 MG/ML  SOLN
100.0000 mL | Freq: Once | INTRAMUSCULAR | Status: AC | PRN
  Administered 2022-09-12: 125 mL via INTRAVENOUS

## 2022-09-14 ENCOUNTER — Encounter (HOSPITAL_COMMUNITY)
Admission: RE | Admit: 2022-09-14 | Discharge: 2022-09-14 | Disposition: A | Discharge status: Home / Self Care | Payer: Medicare HMO | Source: Ambulatory Visit | Attending: Family | Admitting: Family

## 2022-09-14 DIAGNOSIS — R221 Localized swelling, mass and lump, neck: Secondary | ICD-10-CM | POA: Insufficient documentation

## 2022-09-14 DIAGNOSIS — C8308 Small cell B-cell lymphoma, lymph nodes of multiple sites: Secondary | ICD-10-CM | POA: Diagnosis present

## 2022-09-14 MED ORDER — FLUDEOXYGLUCOSE F - 18 (FDG) INJECTION
14.2700 | Freq: Once | INTRAVENOUS | Status: AC
  Administered 2022-09-14: 14.27 via INTRAVENOUS

## 2022-09-14 NOTE — Progress Notes (Signed)
Oley Balm, MD  Leodis Rains D PROCEDURE / BIOPSY REVIEW Date: 09/14/22  Requested Biopsy site: L neck Reason for request: PET+ lesion Imaging review: Best seen on PET 09/14/22  Decision: Approved Imaging modality to perform: Ultrasound Schedule with: Patient preference (Local vs Mod Sed) Schedule for: Any VIR  Additional comments: @VIR : primarily peripheral activity on PET  Please contact me with questions, concerns, or if issue pertaining to this request arise.  Dayne Oley Balm, MD [facility] Radiology

## 2022-09-27 ENCOUNTER — Encounter: Payer: Self-pay | Admitting: *Deleted

## 2022-09-27 NOTE — Progress Notes (Signed)
Patient's caregiver, David Hubbard called the office asking what the next steps were for David Hubbard.   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 He was ordered scans and biopsy. Patient has completed scans but not the biopsy. Reached out to IR scheduling to see why patient hadn't been scheduled and they stated they had been reaching out since 09/14/2022 without contact or call back.  I called David Hubbard [phone]). She states the patient's daughter is out of the country and on a cruise. While she is left trying to manage the patient, she is unable to reach the daughter and has no assistance from her. I gave David Hubbard the number to call to schedule biopsy.  While we were on the phone patient could be heard in the background yelling and moaning. David Hubbard states he does this everyday for hours because he's in so much pain. He's barely ambulatory and she is unable to help with transfers. She feels overwhelmed and doesn't know how to help him. With the patient's level of pain and current physical condition I suggested that she bring the patient to the ED for pain control and to determine the source of patient's pain. We spoke for some time, but David Hubbard wants to wait and see how soon she can get the biopsy scheduled and how he does over the next 24h before deciding whether or not she will take him to the ED.   Oncology Nurse Navigator Documentation     09/27/2022    1:15 PM  Oncology Nurse Navigator Flowsheets  Abnormal Finding Date 09/05/2022  Diagnosis Status Additional Work Up  Navigator Follow Up Date: 10/01/2022  Navigator Follow Up Reason: Other:  Financial risk analyst Encounter Type Telephone  Patient Visit Type MedOnc  Treatment Phase Abnormal Scans  Barriers/Navigation Needs Coordination of Care;Education  Education Other  Interventions Coordination of Care;Education  Acuity Level 2-Minimal Needs (1-2 Barriers Identified)  Coordination of Care Radiology  Education Method Verbal   Support Groups/Services Friends and Family  Time Spent with Patient 45

## 2022-09-28 ENCOUNTER — Other Ambulatory Visit: Payer: Self-pay | Admitting: Student

## 2022-09-28 DIAGNOSIS — C8308 Small cell B-cell lymphoma, lymph nodes of multiple sites: Secondary | ICD-10-CM

## 2022-10-01 ENCOUNTER — Encounter: Payer: Self-pay | Admitting: *Deleted

## 2022-10-01 ENCOUNTER — Ambulatory Visit (HOSPITAL_COMMUNITY): Admission: RE | Admit: 2022-10-01 | Payer: Medicare HMO | Source: Ambulatory Visit

## 2022-10-01 NOTE — Progress Notes (Unsigned)
Patient was scheduled for biopsy today. He was a no-show. Called and spoke to his caregiver, Sheralyn Boatman.  She stated that the patient had a difficult night where he didn't sleep much. She attempted to get him up and to the car this morning, but was unsuccessful. She says he weighs about 300lbs and if he didn't cooperate, there was nothing she could do. She does state that the patient's daughter is back in town, but there hasn't been any discussion with her.  Reviewed the number to central scheduling. Instructed her to call, or have the daughter call and get the biopsy rescheduled. Sheralyn Boatman thought that a biopsy later in the day would be better and she will try to arrange for that.  I also asked about his pain level. She felt that it was decreased and better managed than when we spoke last week.   Oncology Nurse Navigator Documentation     10/01/2022    1:00 PM  Oncology Nurse Navigator Flowsheets  Navigator Follow Up Date: 10/19/2022  Navigator Follow Up Reason: Other:  Financial risk analyst Encounter Type Telephone  Telephone Outgoing Call  Patient Visit Type MedOnc  Treatment Phase Abnormal Scans  Barriers/Navigation Needs Coordination of Care;Education  Interventions Education  Acuity Level 2-Minimal Needs (1-2 Barriers Identified)  Education Method Verbal  Support Groups/Services Friends and Family  Time Spent with Patient 30

## 2022-10-04 ENCOUNTER — Encounter: Payer: Self-pay | Admitting: *Deleted

## 2022-10-04 NOTE — Progress Notes (Signed)
Received a call from [facility]. They have been contacted by family and they are interested in obtaining hospice services, but still want the biopsy done.   Provided them with the [facility] number which provides patients with cost estimates. They will follow up.   Oncology Nurse Navigator Documentation     10/04/2022   11:00 AM  Oncology Nurse Navigator Flowsheets  Navigator Follow Up Date: 10/19/2022  Navigator Follow Up Reason: Other:  Navigator Location [facility]  Navigator Encounter Type Telephone  Telephone Incoming Call  Patient Visit Type MedOnc  Treatment Phase Abnormal Scans  Barriers/Navigation Needs Coordination of Care;Education  Acuity Level 2-Minimal Needs (1-2 Barriers Identified)  Support Groups/Services Friends and Family  Time Spent with Patient 15

## 2022-10-12 ENCOUNTER — Telehealth: Payer: Self-pay | Admitting: *Deleted

## 2022-10-12 NOTE — Telephone Encounter (Signed)
Message received from Tammy with [facility] to inform Dr. Myna Hidalgo that pt.'s family has decided to transition from Palliative care to Hospice care.  Dr. Myna Hidalgo notified.

## 2022-10-17 ENCOUNTER — Ambulatory Visit (HOSPITAL_COMMUNITY): Payer: Medicare HMO

## 2022-10-19 ENCOUNTER — Ambulatory Visit (HOSPITAL_COMMUNITY): Payer: Medicare HMO

## 2022-10-23 ENCOUNTER — Encounter: Payer: Self-pay | Admitting: *Deleted

## 2022-10-23 NOTE — Progress Notes (Signed)
Patient's family has decided to pursue hospice care. Will discontinue navigation at this time.   Oncology Nurse Navigator Documentation     10/23/2022   12:00 PM  Oncology Nurse Navigator Flowsheets  Navigation Complete Date: 10/23/2022  Post Navigation: Continue to Follow Patient? No  Reason Not Navigating Patient: Pharmacologist Encounter Type Appt/Treatment Plan Review  Time Spent with Patient 15

## 2022-11-09 DEATH — deceased
